# Patient Record
Sex: Female | Born: 1960 | Race: Black or African American | Hispanic: No | Smoking: Current every day smoker
Health system: Southern US, Community
[De-identification: ages and names within clinical notes are randomized; demographics above are authoritative.]

## PROBLEM LIST (undated history)

## (undated) DIAGNOSIS — Z95828 Presence of other vascular implants and grafts: Secondary | ICD-10-CM

## (undated) DIAGNOSIS — E1140 Type 2 diabetes mellitus with diabetic neuropathy, unspecified: Secondary | ICD-10-CM

## (undated) DIAGNOSIS — G709 Myoneural disorder, unspecified: Secondary | ICD-10-CM

## (undated) DIAGNOSIS — Z6823 Body mass index (BMI) 23.0-23.9, adult: Secondary | ICD-10-CM

## (undated) DIAGNOSIS — G894 Chronic pain syndrome: Secondary | ICD-10-CM

## (undated) DIAGNOSIS — I739 Peripheral vascular disease, unspecified: Secondary | ICD-10-CM

## (undated) DIAGNOSIS — I10 Essential (primary) hypertension: Secondary | ICD-10-CM

## (undated) DIAGNOSIS — E119 Type 2 diabetes mellitus without complications: Secondary | ICD-10-CM

## (undated) DIAGNOSIS — I519 Heart disease, unspecified: Secondary | ICD-10-CM

## (undated) DIAGNOSIS — C50919 Malignant neoplasm of unspecified site of unspecified female breast: Secondary | ICD-10-CM

## (undated) DIAGNOSIS — F321 Major depressive disorder, single episode, moderate: Secondary | ICD-10-CM

## (undated) DIAGNOSIS — F419 Anxiety disorder, unspecified: Secondary | ICD-10-CM

## (undated) DIAGNOSIS — D72829 Elevated white blood cell count, unspecified: Secondary | ICD-10-CM

## (undated) DIAGNOSIS — Z9989 Dependence on other enabling machines and devices: Secondary | ICD-10-CM

## (undated) HISTORY — DX: Peripheral vascular disease, unspecified: I73.9

## (undated) HISTORY — DX: Anxiety disorder, unspecified: F41.9

## (undated) HISTORY — DX: Type 2 diabetes mellitus without complications: E11.9

## (undated) HISTORY — DX: Chronic pain syndrome: G89.4

## (undated) HISTORY — DX: Heart disease, unspecified: I51.9

## (undated) HISTORY — DX: Essential (primary) hypertension: I10

## (undated) HISTORY — DX: Major depressive disorder, single episode, moderate: F32.1

## (undated) HISTORY — DX: Type 2 diabetes mellitus with diabetic neuropathy, unspecified: E11.40

## (undated) HISTORY — DX: Malignant neoplasm of unspecified site of unspecified female breast: C50.919

## (undated) HISTORY — DX: Body mass index (BMI) 23.0-23.9, adult: Z68.23

## (undated) HISTORY — DX: Elevated white blood cell count, unspecified: D72.829

## (undated) MED FILL — Carboplatin IV Soln 600 MG/60ML: INTRAVENOUS | Qty: 72 | Status: AC

---

## 1993-01-31 HISTORY — PX: MYOMECTOMY: SHX85

## 1999-01-01 ENCOUNTER — Other Ambulatory Visit: Admission: RE | Admit: 1999-01-01 | Discharge: 1999-01-01 | Payer: Self-pay | Admitting: Gynecology

## 2000-07-15 ENCOUNTER — Emergency Department (HOSPITAL_COMMUNITY): Admission: EM | Admit: 2000-07-15 | Discharge: 2000-07-16 | Payer: Self-pay | Admitting: Emergency Medicine

## 2002-01-31 HISTORY — PX: HYSTERECTOMY ABDOMINAL WITH SALPINGO-OOPHORECTOMY: SHX6792

## 2010-09-08 ENCOUNTER — Ambulatory Visit: Payer: Self-pay

## 2019-04-01 DIAGNOSIS — Z8639 Personal history of other endocrine, nutritional and metabolic disease: Secondary | ICD-10-CM

## 2019-04-01 HISTORY — DX: Personal history of other endocrine, nutritional and metabolic disease: Z86.39

## 2019-04-01 HISTORY — PX: BIOPSY THYROID: PRO38

## 2019-04-24 ENCOUNTER — Other Ambulatory Visit: Payer: Self-pay

## 2019-04-26 ENCOUNTER — Other Ambulatory Visit: Payer: Self-pay

## 2019-04-26 ENCOUNTER — Ambulatory Visit: Payer: Self-pay | Admitting: Internal Medicine

## 2019-04-26 ENCOUNTER — Encounter: Payer: Self-pay | Admitting: Internal Medicine

## 2019-04-26 VITALS — BP 110/68 | HR 105 | Temp 98.4°F | Ht 69.0 in | Wt 166.6 lb

## 2019-04-26 DIAGNOSIS — E0590 Thyrotoxicosis, unspecified without thyrotoxic crisis or storm: Secondary | ICD-10-CM

## 2019-04-26 DIAGNOSIS — E0520 Thyrotoxicosis with toxic multinodular goiter without thyrotoxic crisis or storm: Secondary | ICD-10-CM

## 2019-04-26 DIAGNOSIS — E042 Nontoxic multinodular goiter: Secondary | ICD-10-CM

## 2019-04-26 HISTORY — DX: Nontoxic multinodular goiter: E04.2

## 2019-04-26 HISTORY — DX: Thyrotoxicosis, unspecified without thyrotoxic crisis or storm: E05.90

## 2019-04-26 LAB — TSH: TSH: 0.83 u[IU]/mL (ref 0.35–4.50)

## 2019-04-26 LAB — T4, FREE: Free T4: 1.01 ng/dL (ref 0.60–1.60)

## 2019-04-26 MED ORDER — ATENOLOL 25 MG PO TABS: 25.0000 mg | ORAL_TABLET | Freq: Every day | ORAL | 4 refills | Status: DC

## 2019-04-26 NOTE — Progress Notes (Signed)
Name: Brittney Burgess  MRN/ DOB: [id number], 09-01-1960    Age/ Sex: 59 y.o., female    PCP: Leonides Sake, MD   Reason for Endocrinology Evaluation: MNG     Date of Initial Endocrinology Evaluation: 04/26/2019     HPI: [name] is a 59 y.o. female with a past medical history of T2DM, HTN and MNG. The patient presented for initial endocrinology clinic visit on 04/26/2019 for consultative assistance with her MNG.    Patient had recent hospitalization in 02/2019 for hypertensive emergency due to and treated blood pressure for approximately 4.5 years.  She was also found to have an A1c of over 14.0%, and during this hospitalization she was found to have a TSH of 0.2 uIU/mL , with an elevated free T4 at 1.97 ng/dL. Patient was symptomatic at the time with hyperthyroid symptoms such as excessive sweating, tremors, and anxiety. Pt had an ultrasound on 03/26/2019 showing multiple thyroid nodules, a right mid nodule meeting criteria for FNA measuring 2.9x1.7x2.4 cm. She had an FNA on 04/18/2019 with atypia (Bethesda Category III) .  An uptake and scan on 03/26/2019 showed a dominant cold nodule at the inferior pole of the right lobe.  With a 24-hour I-131 uptake of 23.5%   Today she continues to have jittery sensation and tremors, as well as weight loss  Denies palpitations  Denies eye symptoms  No prior exposure to radiation    No FH of thyroid disease   HISTORY:   Past Medical History: No past medical history on file.  Past Surgical History:    Social History:  reports that she has been smoking. She has never used smokeless tobacco. She reports that she does not drink alcohol or use drugs.  Family History: family history is not on file.   HOME MEDICATIONS: Allergies as of 04/26/2019      Reactions   Metformin And Related Other (See Comments)   Twitching, nausea      Medication List       Accurate as of April 26, 2019 12:51 PM. If you have any questions, ask your  nurse or doctor.        amLODipine 5 MG tablet Commonly known as: NORVASC Take by mouth.   atenolol 25 MG tablet Commonly known as: TENORMIN Take 1 tablet (25 mg total) by mouth daily. Started by: Dorita Sciara, MD   hydrochlorothiazide 25 MG tablet Commonly known as: HYDRODIURIL Take by mouth.   insulin glargine 100 UNIT/ML Solostar Pen Commonly known as: LANTUS Inject into the skin. 36-41 units   losartan 50 MG tablet Commonly known as: COZAAR Take by mouth.        OBJECTIVE:  VS: BP 110/68 (BP Location: Left Arm, Patient Position: Sitting, Cuff Size: Large)   Pulse (!) 105   Temp 98.4 F (36.9 C)   Ht 5\' 9"  (1.753 m)   Wt 166 lb 9.6 oz (75.6 kg)   SpO2 98%   BMI 24.60 kg/m    Wt Readings from Last 3 Encounters:  04/26/19 166 lb 9.6 oz (75.6 kg)     EXAM: General: Pt appears well and is in NAD  Eyes: External eye exam normal without stare, lid lag or exophthalmos.  EOM intact.   Neck: General: Supple without adenopathy. Thyroid: Thyroid is enlarged with multiple nodules appreciated. No thyroid bruit.  Lungs: Clear with good BS bilat with no rales, rhonchi, or wheezes  Heart: Auscultation: RRR.  Abdomen: Normoactive bowel sounds,  soft, nontender, without masses or organomegaly palpable  Extremities:  BL LE: No pretibial edema normal ROM and strength.  Mental Status: Judgment, insight: Intact Orientation: Oriented to time, place, and person Mood and affect: No depression, anxiety, or agitation     DATA REVIEWED: TFT's pending  Thyroid uptake and scan  03/26/2019  Showed a dominant cold nodule at the inferior pole of the right lobe.  With a 24-hour I-131 uptake of 23.5%  Thyroid ultrasound 03/26/2019           FNA 04/18/2019     Afirma - Pending   ASSESSMENT/PLAN/RECOMMENDATIONS:   1. 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 She was informed of need for regular thyroid function monitoring while on methimazole to ensure appropriate dosage without over-treatment. As well, we discussed the possible side effects of methimazole including the chance of rash, the small chance of liver irritation/juandice and the <=1 in 300-400 chance of sudden onset agranulocytosis.  We discussed importance of going to ED promptly (and stopping methimazole) if shewere to develop significant fever with severe sore throat of other evidence of acute infection.     -We will repeat TFTs - I am going to start her on a small dose of beta-blocker due to tachycardia   Medications : Start atenolol 25 mg daily    2.  Multinodular goiter:   -Patient has no local neck symptoms -She is status post FNA of the right inferior nodule, this nodule was called on the thyroid uptake and scan.  Cytology report consistent with FLUS (Bethesda category III) -Awaiting Afirma -We discussed that if her Afirma results are benign we will repeat thyroid ultrasound in a year, but if her Afirma results come back consistent with malignancy we will proceed with thyroid surgery.    Follow-up in 3 months  Signed electronically by: Mack Guise, MD  [facility]  [facility] [address] [place], [place] [address] Phone: [phone] FAX: [phone]   CC: Leonides Sake, [place] [address] Phone: [phone] Fax: [phone]   Return to Endocrinology clinic as below: Future Appointments  Date Time Provider [place]  07/26/2019  7:50 AM Alphia Behanna, Melanie Crazier, MD [facility] None

## 2019-04-26 NOTE — Patient Instructions (Addendum)
-   Please make sure you send up a copy of your Afirma results to determine the next step  - Start Atenolol 25 mg , once daily

## 2019-04-27 LAB — T3: T3, Total: 109 ng/dL (ref 76–181)

## 2019-04-29 ENCOUNTER — Telehealth: Payer: Self-pay | Admitting: Internal Medicine

## 2019-04-29 NOTE — Telephone Encounter (Signed)
Please let the pt know her thyroid function is normal. She can stop the Atenolol , she will not need any thyroid therapy.   The most common reason for her prior abnormal thyroid results is transient thyroid inflammation that appears to have resolved.     Thanks    Abby Nena Jordan, MD  [facility] End[facility]in[facility]  [facility] [address] [place], [place] [address] Phone: [phone] FAX: [phone]

## 2019-04-30 NOTE — Telephone Encounter (Signed)
Patient notified of lab results and states that she is happy to hear that.

## 2019-04-30 NOTE — Telephone Encounter (Signed)
Please refer to Dr. Quin Hoop message below and call pt as requested

## 2019-07-25 NOTE — Progress Notes (Deleted)
Name: Brittney Burgess  MRN/ DOB: [id number], [birth year]    Age/ Sex: 59 y.o., female     PCP: Leonides Sake, MD   Reason for Endocrinology Evaluation: MNG     Initial Endocrinology Clinic Visit: 04/26/2019    PATIENT IDENTIFIER: Brittney Burgess is a 59 y.o., female with a past medical history of HTN and T2DM. She has followed with [facility] since 04/26/2019 for consultative assistance with management of her MNG  HISTORICAL SUMMARY:  Pt was found to have a TSH of 0.2 uIU/mL during hospitalization in 02/2019 with an elevated free T4 at 1.97 ng/dL, a thyroid ultrasound on 03/26/2019 showed multiple thyroid nodules, a right mid nodule meeting criteria for FNA measuring 2.9 x1.7 x 2.4 cm. She had an FNA on 04/18/2019 with atypia (Bethesda Category III), Afirma Benign  .  An uptake and scan on 03/26/2019 showed a dominant cold nodule at the inferior pole of the right lobe.  With a 24-hour I-131 uptake of 23.5%   SUBJECTIVE:   Today (07/25/2019):  [name] is here for a follow up on MNG.   ROS:  As per HPI.   HISTORY:   Past Medical History: No past medical history on file.  Past Surgical History: *** The histories are not reviewed yet. Please review them in the "History" navigator section and refresh this [place].  Social History:  reports that she has been smoking. She has never used smokeless tobacco. She reports that she does not drink alcohol and does not use drugs.  Family History: No family history on file.   HOME MEDICATIONS: Allergies as of 07/26/2019      Reactions   Metformin And Related Other (See Comments)   Twitching, nausea      Medication List       Accurate as of July 25, 2019  8:38 AM. If you have any questions, ask your nurse or doctor.        amLODipine 5 MG tablet Commonly known as: NORVASC Take by mouth.   hydrochlorothiazide 25 MG tablet Commonly known as: HYDRODIURIL Take by mouth.   insulin glargine 100 UNIT/ML Solostar  Pen Commonly known as: LANTUS Inject into the skin. 36-41 units   losartan 50 MG tablet Commonly known as: COZAAR Take by mouth.         OBJECTIVE:   PHYSICAL EXAM: VS: There were no vitals taken for this visit.   EXAM: General: Pt appears well and is in NAD  Neck: General: Supple without adenopathy. Thyroid: Thyroid size normal.  No goiter or nodules appreciated. No thyroid bruit.  Lungs: Clear with good BS bilat with no rales, rhonchi, or wheezes  Heart: Auscultation: RRR.  Abdomen: Normoactive bowel sounds, soft, nontender, without masses or organomegaly palpable  Extremities:  BL LE: No pretibial edema normal ROM and strength.  Mental Status: Judgment, insight: Intact Orientation: Oriented to time, place, and person Mood and affect: No depression, anxiety, or agitation     DATA REVIEWED: ***  Thyroid uptake and scan  03/26/2019  Showed a dominant cold nodule at the inferior pole of the right lobe.  With a 24-hour I-131 uptake of 23.5%  ASSESSMENT / PLAN / RECOMMENDATIONS:   1. Multinodular Goiter:   - S/P FNA of the right mid nodule 2.9 x1.7 x 2.4 cm, on 04/18/2019 with atypia (Bethesda Category III), Afirma Benign       2. Hyperthyroidism:  Medications   ***   Signed electronically by: Elenora Gamma  Kelton Pillar, MD  [facility] Endocrinology  [facility] [address] [place], [place] [address] Phone: [phone] FAX: [phone]      CC: Leonides Sake, [place] [address] Phone: [phone]  Fax: [phone]   Return to Endocrinology clinic as below: Future Appointments  Date Time Provider [place]  07/26/2019  7:50 AM Delaynee Alred, Melanie Crazier, MD [facility] None

## 2019-07-26 ENCOUNTER — Ambulatory Visit: Payer: Self-pay | Admitting: Internal Medicine

## 2019-07-26 DIAGNOSIS — Z0289 Encounter for other administrative examinations: Secondary | ICD-10-CM

## 2019-10-25 ENCOUNTER — Other Ambulatory Visit: Payer: Self-pay | Admitting: Surgery

## 2019-10-25 DIAGNOSIS — Z171 Estrogen receptor negative status [ER-]: Secondary | ICD-10-CM

## 2019-10-25 DIAGNOSIS — C50512 Malignant neoplasm of lower-outer quadrant of left female breast: Secondary | ICD-10-CM

## 2019-11-11 ENCOUNTER — Other Ambulatory Visit: Payer: Self-pay | Admitting: Hematology and Oncology

## 2019-11-11 DIAGNOSIS — Z171 Estrogen receptor negative status [ER-]: Secondary | ICD-10-CM

## 2019-11-11 DIAGNOSIS — C50512 Malignant neoplasm of lower-outer quadrant of left female breast: Secondary | ICD-10-CM

## 2019-11-11 LAB — HEPATIC FUNCTION PANEL
ALT: 12 (ref 7–35)
AST: 24 (ref 13–35)
Alkaline Phosphatase: 135 — AB (ref 25–125)
Bilirubin, Total: 0.4

## 2019-11-11 LAB — CBC: RBC: 4.43 (ref 3.87–5.11)

## 2019-11-11 LAB — CBC AND DIFFERENTIAL
HCT: 37 (ref 36–46)
Hemoglobin: 12.5 (ref 12.0–16.0)
Neutrophils Absolute: 9230
Platelets: 291 (ref 150–399)
WBC: 12.3

## 2019-11-11 LAB — BASIC METABOLIC PANEL
BUN: 15 (ref 4–21)
CO2: 25 — AB (ref 13–22)
Chloride: 107 (ref 99–108)
Creatinine: 0.8 (ref 0.5–1.1)
Glucose: 148
Potassium: 3.4 (ref 3.4–5.3)
Sodium: 142 (ref 137–147)

## 2019-11-11 LAB — COMPREHENSIVE METABOLIC PANEL
Albumin: 4.3 (ref 3.5–5.0)
Calcium: 10 (ref 8.7–10.7)

## 2019-11-13 ENCOUNTER — Ambulatory Visit
Admission: RE | Admit: 2019-11-13 | Discharge: 2019-11-13 | Disposition: A | Discharge status: Home / Self Care | Payer: PRIVATE HEALTH INSURANCE | Source: Ambulatory Visit | Attending: Surgery | Admitting: Surgery

## 2019-11-13 DIAGNOSIS — Z171 Estrogen receptor negative status [ER-]: Secondary | ICD-10-CM

## 2019-11-13 DIAGNOSIS — C50512 Malignant neoplasm of lower-outer quadrant of left female breast: Secondary | ICD-10-CM

## 2019-11-13 MED ORDER — GADOBUTROL 1 MMOL/ML IV SOLN
7.0000 mL | Freq: Once | INTRAVENOUS | Status: AC | PRN
  Administered 2019-11-13: 7 mL via INTRAVENOUS

## 2019-12-08 DIAGNOSIS — C50919 Malignant neoplasm of unspecified site of unspecified female breast: Secondary | ICD-10-CM | POA: Insufficient documentation

## 2019-12-08 DIAGNOSIS — C50512 Malignant neoplasm of lower-outer quadrant of left female breast: Secondary | ICD-10-CM | POA: Insufficient documentation

## 2019-12-08 HISTORY — DX: Malignant neoplasm of lower-outer quadrant of left female breast: C50.512

## 2019-12-25 HISTORY — PX: BREAST LUMPECTOMY: SHX2

## 2020-01-01 DIAGNOSIS — E876 Hypokalemia: Secondary | ICD-10-CM

## 2020-01-01 HISTORY — DX: Hypokalemia: E87.6

## 2020-01-07 ENCOUNTER — Other Ambulatory Visit: Payer: Self-pay | Admitting: Oncology

## 2020-01-07 DIAGNOSIS — Z171 Estrogen receptor negative status [ER-]: Secondary | ICD-10-CM

## 2020-01-08 ENCOUNTER — Other Ambulatory Visit: Payer: PRIVATE HEALTH INSURANCE

## 2020-01-08 ENCOUNTER — Ambulatory Visit: Payer: PRIVATE HEALTH INSURANCE | Admitting: Oncology

## 2020-01-08 NOTE — Progress Notes (Incomplete)
[facility]  [address] [place],  [place]  [address] [phone]  Clinic Day:  01/08/2020  Referring physician: Carolynne Edouard, MD   This document serves as a record of services personally performed by Hosie Poisson, MD. It was created on their behalf by Curry,Lauren E, a trained medical scribe. The creation of this record is based on the scribe's personal observations and the provider's statements to them.   CHIEF COMPLAINT:  CC: Stage I ductal carcinoma of the left breast  Current Treatment:  ***   HISTORY OF PRESENT ILLNESS:  Brittney Burgess is a 59 y.o. 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  This was found on screening mammogram from August which revealed a mass with calcifications, a second asymmetry and possible dilated ducts within the left breast.  Right breast was negative.  Diagnostic unilateral left mammogram and left breast ultrasound confirmed a 1.6 cm elongated mass containing pleomorphic calcifications at 7 o'clock, a 5 mm hypoechoic indeterminate mass containing 2 benign-appearing calcification at 2 o'clock, and several retroareolar ecstatic ducts containing internal debris or possible tissue.  Ultrasound guided biopsy was then pursued on September 20th which revealed invasive ductal carcinoma, grade 3, and high grade ductal carcinoma in situ with necrosis at 5 o'clock.  The mass at 2 o'clock was consistent with fibrocystic changes including sclerosing adenosis with calcifications.  No atypia, in situ or invasive malignancy.   Biopsy at 11:30 o'clock revealed a fragmented intraductal papilloma and dilated duct.  No atypia, in situ or invasive malignancy identified.  She was referred and has been seen by Dr. Lilia Pro, and they do plan to pursue a lumpectomy.  She is scheduled for a breast MRI on Wednesday.    She states that she is healing well from her procedure, and is doing well.  She  does report neuropathy of her feet from her diabetes.  Her white count is 12.3, and her hemoglobin and platelets are normal.  Chemistries are unremarkable except for a mildly low potassium of 3.4.  I gave her a list of potassium rich foods to include in her diet.  Her appetite is good, and she is eating well.  She denies any significant weight loss or gain. She denies fever or chills.  She denies nausea, vomiting, bowel issues, or abdominal pain.  She denies sore throat, cough, dyspnea, or chest pain.  She started menarche at age 36.  She is nulliparous.  She did take oral contraceptive for a time, but this made her gain a significant amount of weight.  She had an emergency hysterectomy and bilateral salpingo oophorectomy in 2004 due to menorrhagia.  She is unsure if she was given hormone replacement.  Her friend Jeffry accompanies her today.    INTERVAL HISTORY:  Brittney Burgess is here for follow up after undergoing left breast lumpectomy and left axillary sentinel lymph node biopsy on November 11th with Dr. Lilia Pro.  Surgical pathology from this procedure confirmed invasive and in situ ductal carcinoma measuring 1.2 cm.  Margins not involved.  One sentinel lymph node was negative for malignancy.   Her  appetite is good, and she has gained/lost _ pounds since her last visit.  She denies fever, chills or other signs of infection.  She denies nausea, vomiting, bowel issues, or abdominal pain.  She denies sore throat, cough, dyspnea, or chest pain.   REVIEW OF SYSTEMS:  Review of Systems - Oncology   VITALS:  There were no vitals taken for this visit.  Wt Readings from Last 3 Encounters:  04/26/19 166 lb 9.6 oz (75.6 kg)    There is no height or weight on file to calculate BMI.  Performance status (ECOG): {CHL ONC [id number]  PHYSICAL EXAM:  Physical Exam  LABS:   CBC Latest Ref Rng & Units 11/11/2019  WBC - 12.3  Hemoglobin 12.0 - 16.0 12.5  Hematocrit 36 - 46 37  Platelets 150 - 399 291    CMP Latest Ref Rng & Units 11/11/2019  BUN 4 - 21 15  Creatinine 0.5 - 1.1 0.8  Sodium 137 - 147 142  Potassium 3.4 - 5.3 3.4  Chloride 99 - 108 107  CO2 13 - 22 25(A)  Calcium 8.7 - 10.7 10.0  Alkaline Phos 25 - 125 135(A)  AST 13 - 35 24  ALT 7 - 35 12     No results found for: CEA1 / No results found for: CEA1 No results found for: PSA1 No results found for: SWH675 No results found for: FFM384  No results found for: TOTALPROTELP, ALBUMINELP, A1GS, A2GS, BETS, BETA2SER, GAMS, MSPIKE, SPEI No results found for: TIBC, FERRITIN, IRONPCTSAT No results found for: LDH   STUDIES:   She underwent left breast lumpectomy and left axillary sentinel lymph node biopsy on 12/23/2019 and surgical pathology from this procedure revealed: 1.  Breast, lumpectomy, left:  -  Invasive and in situ ductal carcinoma, 1.2 cm  -  Margins not involved  -  Invasive carcinoma 0.2 cm from superior margin  -  Ductal carcinoma in situ 0.1 cm from inferior margin  -  Biopsy site and biopsy clip 2.  Lymph node, sentinel, biopsy, left:  -  One benign, reactive lymph node with focal biopsy site reaction (0/1) Tumor size: 1.2 x 1.0 x 0.8 cm Histologic grade: 3   Allergies:  Allergies  Allergen Reactions  . Metformin And Related Other (See Comments)    Twitching, nausea    Current Medications: Current Outpatient Medications  Medication Sig Dispense Refill  . amLODipine (NORVASC) 5 MG tablet Take by mouth.    . hydrochlorothiazide (HYDRODIURIL) 25 MG tablet Take by mouth.    . insulin glargine (LANTUS) 100 UNIT/ML Solostar Pen Inject into the skin. 36-41 units    . losartan (COZAAR) 50 MG tablet Take by mouth.     No current facility-administered medications for this visit.     ASSESSMENT & PLAN:   Assessment:   1.  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  HER2 positive.  Estrogen and progesterone receptors were negative.  Ki67 was 50%.  She has met with  Dr. Lilia Pro who will be pursuing breast conservative surgery with lumpectomy if the MRI is otherwise clear.  I do not feel neoadjuvant chemotherapy is necessary since this tumor is less than 2 cm.  2.  Hypokalemia, mild.  I gave her a list of potassium rich foods to include through diet.  3.  Neuropathy of bilateral feet.  This is currently well controlled.  4.  Tobacco abuse.  We discussed the importance of smoking cessation.  She has a prescription for Nicorette gum.  Plan: She underwent left breast lumpectomy and left axillary sentinel lymph node biopsy on November 11th with Dr. Lilia Pro.  Surgical pathology from this procedure confirmed invasive and in situ ductal carcinoma measuring 1.2 cm.  Margins not involved.  One sentinel lymph node was negative for malignancy.  We  reviewed her prognostic features and discussed adjuvant therapy.  As she is HER2 positive, I recommend treatment with TCHP IV every 3 weeks for 6 cycles, then continue maintenance Herceptin and Perjeta for a total of 1 year.  I explained the schedule and possible toxicities associated with treatment, but we would schedule her for chemo-education prior to her start date.  She would benefit from Neulasta to help prevent associated leukopenia/neutropenia as well.  She will also need to undergo a port placement which can be scheduled with Dr. Lilia Pro.  She will also need a baseline ECHO prior to initiation.  Adjuvant radiation is also advised as she is pursuing breast conservative surgery.  She understands and agrees to this plan of care.  I have answered her questions and she knows to call with any concerns.   I provided *** minutes (8:44 AM - 8:44 AM) of face-to-face time during this this encounter and > 50% was spent counseling as documented under my assessment and plan.    Derwood Kaplan, MD [facility] [address] [place] [address] Dept:  [phone] Dept Fax: [phone]   I, Rita Ohara, am acting as scribe for Derwood Kaplan, MD  I have reviewed this report as typed by the medical scribe, and it is complete and accurate.

## 2020-01-09 DIAGNOSIS — I10 Essential (primary) hypertension: Secondary | ICD-10-CM | POA: Insufficient documentation

## 2020-01-09 DIAGNOSIS — E1140 Type 2 diabetes mellitus with diabetic neuropathy, unspecified: Secondary | ICD-10-CM

## 2020-01-09 HISTORY — DX: Type 2 diabetes mellitus with diabetic neuropathy, unspecified: E11.40

## 2020-01-10 NOTE — Progress Notes (Signed)
[name]  [address] [place],  [place]  [address] [phone]  Clinic Day:  01/13/2020  Referring physician: Carolynne Edouard, MD   This document serves as a record of services personally performed by Hosie Poisson, MD. It was created on their behalf by Curry,Lauren E, a trained medical scribe. The creation of this record is based on the scribe's personal observations and the provider's statements to them.   CHIEF COMPLAINT:  CC: Stage I invasive ductal carcinoma of the left breast  Current Treatment:  Will plan for TCHP IV every 3 weeks for 6 doses, followed by maintenance Herceptin/Perjeta for a total of 1 year of therapy.   HISTORY OF PRESENT ILLNESS:  Brittney Burgess is a 59 y.o. 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  This was found on screening mammogram from August which revealed a mass with calcifications, a second asymmetry and possible dilated ducts within the left breast.  Right breast was negative.  Diagnostic unilateral left mammogram and left breast ultrasound confirmed a 1.6 cm elongated mass containing pleomorphic calcifications at 7 o'clock, a 5 mm hypoechoic indeterminate mass containing 2 benign-appearing calcification at 2 o'clock, and several retroareolar ecstatic ducts containing internal debris or possible tissue.  Ultrasound guided biopsy was then pursued on September 20th which revealed invasive ductal carcinoma, grade 3, and high grade ductal carcinoma in situ with necrosis at 5 o'clock.  The mass at 2 o'clock was consistent with fibrocystic changes including sclerosing adenosis with calcifications.  No atypia, in situ or invasive malignancy.   Biopsy at 11:30 o'clock revealed a fragmented intraductal papilloma and dilated duct.  No atypia, in situ or invasive malignancy identified.  She does report neuropathy of her feet from her diabetes.  She had lumpectomy with sentinel  lymph node on November 22nd.  She also had placement of a port at that time.  Her potassium was mildly low on her initial labs and so we will follow up on that.  She had an emergency hysterectomy and bilateral salpingo oophorectomy in 2004 due to menorrhagia.  She is unsure if she was given hormone replacement.   INTERVAL HISTORY:  Brittney Burgess is here for follow up after undergoing left breast lumpectomy and left axillary sentinel lymph node biopsy on November 22nd with Dr. Lilia Pro.  Surgical pathology from this procedure confirmed invasive and in situ ductal carcinoma measuring 1.2 cm.  Margins not involved.  One sentinel lymph node was negative for malignancy for a T1cN0M0, stage IB.  Estrogen and progesterone receptors were negative with HER2 positive and a Ki67 of 50%.  She has already had a port placed.  She still has residual tenderness of the left axilla, but is otherwise healing well.  She is scheduled to follow up with Dr. Lilia Pro in mid January.  Her  appetite is good, and she has gained 5 pounds since her last visit.  She denies fever, chills or other signs of infection.  She denies nausea, vomiting, bowel issues, or abdominal pain.  She denies sore throat, cough, dyspnea, or chest pain.    REVIEW OF SYSTEMS:  Review of Systems  Constitutional: Negative.   HENT:  Negative.   Eyes: Negative.   Respiratory: Negative.   Cardiovascular: Negative.   Gastrointestinal: Negative.   Endocrine: Negative.   Genitourinary: Negative.    Musculoskeletal: Negative.        Mild residual tenderness of the left axilla  Skin: Negative.  Neurological: Positive for headaches (this morning, mild).  Hematological: Negative.   Psychiatric/Behavioral: Negative.      VITALS:  Blood pressure (!) 177/93, pulse (!) 111, temperature 98.2 F (36.8 C), resp. rate 16, height '5\' 9"'  (1.753 m), weight 172 lb (78 kg), SpO2 95 %.  Wt Readings from Last 3 Encounters:  01/13/20 172 lb (78 kg)  04/26/19 166 lb 9.6 oz (75.6  kg)    Body mass index is 25.4 kg/m.  Performance status (ECOG): 1 - Symptomatic but completely ambulatory  PHYSICAL EXAM:  Physical Exam Constitutional:      General: She is not in acute distress.    Appearance: Normal appearance. She is normal weight.  HENT:     Head: Normocephalic and atraumatic.  Eyes:     General: No scleral icterus.    Extraocular Movements: Extraocular movements intact.     Conjunctiva/sclera: Conjunctivae normal.     Pupils: Pupils are equal, round, and reactive to light.  Cardiovascular:     Rate and Rhythm: Regular rhythm. Tachycardia present.     Pulses: Normal pulses.     Heart sounds: Normal heart sounds. No murmur heard. No friction rub. No gallop.   Pulmonary:     Effort: Pulmonary effort is normal. No respiratory distress.     Breath sounds: Normal breath sounds.  Chest:  Breasts:     Right: Normal.     Left: Normal.      Comments: Axillary incision is healing well, but mildly tender.  She has an incision in the inferior left breast which is healing well.  Port site in right upper chest is healing well with just one area of scab  She has a healing boil of the left inner breast and several other keloids of both breasts.  No masses in either breast. Abdominal:     General: Bowel sounds are normal. There is no distension.     Palpations: Abdomen is soft. There is no mass.     Tenderness: There is no abdominal tenderness.  Musculoskeletal:        General: Normal range of motion.     Cervical back: Normal range of motion and neck supple.     Right lower leg: No edema.     Left lower leg: No edema.  Lymphadenopathy:     Cervical: No cervical adenopathy.  Skin:    General: Skin is warm and dry.  Neurological:     General: No focal deficit present.     Mental Status: She is alert and oriented to person, place, and time. Mental status is at baseline.  Psychiatric:        Mood and Affect: Mood normal.        Behavior: Behavior normal.         Thought Content: Thought content normal.        Judgment: Judgment normal.     LABS:   CBC Latest Ref Rng & Units 11/11/2019  WBC - 12.3  Hemoglobin 12.0 - 16.0 12.5  Hematocrit 36 - 46 37  Platelets 150 - 399 291   CMP Latest Ref Rng & Units 11/11/2019  BUN 4 - 21 15  Creatinine 0.5 - 1.1 0.8  Sodium 137 - 147 142  Potassium 3.4 - 5.3 3.4  Chloride 99 - 108 107  CO2 13 - 22 25(A)  Calcium 8.7 - 10.7 10.0  Alkaline Phos 25 - 125 135(A)  AST 13 - 35 24  ALT 7 - 35 12  STUDIES:   She underwent left breast lumpectomy and left axillary sentinel lymph node biopsy on 12/23/2019 and surgical pathology from this procedure revealed: 1.  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  Lymph node, sentinel, biopsy, left:             -  One benign, reactive lymph node with focal biopsy site reaction (0/1) Tumor size: 1.2 x 1.0 x 0.8 cm Histologic grade: 3  Allergies:  Allergies  Allergen Reactions  . Metformin And Related Other (See Comments)    Twitching, nausea    Current Medications: Current Outpatient Medications  Medication Sig Dispense Refill  . amLODipine (NORVASC) 5 MG tablet Take by mouth.    . hydrochlorothiazide (HYDRODIURIL) 25 MG tablet Take by mouth.    . insulin glargine (LANTUS) 100 UNIT/ML Solostar Pen Inject into the skin. 36-41 units    . losartan (COZAAR) 50 MG tablet Take by mouth.     No current facility-administered medications for this visit.     ASSESSMENT & PLAN:   Assessment:   1.  Stage IB (T1cN0M0) invasive ductal carcinoma and high grade ductal carcinoma in situ.  HER2 positive.  Estrogen and progesterone receptors were negative.  Ki67 was 50%.  She has undergone lumpectomy with Dr.  Lilia Pro.  We will now plan for TCHP IV every 3 weeks for 6 cycles, to then continue with maintenance Herceptin/Perjeta for a total of 1 year.  2.  Hypokalemia, mild.  I gave her a list of potassium rich foods to include through diet.  3.  Neuropathy of bilateral feet.  This is currently well controlled.  4.  Tobacco abuse.  We discussed the importance of smoking cessation.  She has a prescription for Nicorette gum.  Plan: She underwent left breast lumpectomy and left axillary sentinel lymph node biopsy on November 22nd with Dr. Lilia Pro.  Surgical pathology from this procedure confirmed invasive and in situ ductal carcinoma measuring 1.2 cm.  Margins not involved.  One sentinel lymph node was negative for malignancy for a stage IB.  We reviewed her prognostic features and discussed adjuvant therapy.  As she is HER2 positive, I recommend treatment with TCHP IV every 3 weeks for 6 cycles, then continue maintenance Herceptin and Perjeta for a total of 1 year.  I explained the schedule and possible toxicities associated with treatment, but we will schedule her for chemo-education prior to her start date.  We will need a baseline ECHO prior to initiation.  She would also benefit from Neulasta to help prevent associated leukopenia/neutropenia as well.  We will plan to start treatment at the end of the month.  Adjuvant radiation is also advised as she opted for breast conservative surgery, but we will postpone that until she has completed the chemotherapy portion of her treatment.  She understands and agrees to this plan of care.  I have answered her questions and she knows to call  with any concerns.   I provided 25 minutes of face-to-face time during this this encounter and > 50% was spent counseling as documented under my assessment and plan.    Derwood Kaplan, MD [facility] AT [facility] [address] [place] [address] Dept:  [phone] Dept Fax: [phone]   I, Rita Ohara, am acting as scribe for Derwood Kaplan, MD  I have reviewed this report as typed by the medical scribe, and it is complete and accurate.

## 2020-01-13 ENCOUNTER — Encounter: Payer: Self-pay | Admitting: Oncology

## 2020-01-13 ENCOUNTER — Inpatient Hospital Stay: Payer: Medicaid Other | Attending: Oncology | Admitting: Oncology

## 2020-01-13 ENCOUNTER — Other Ambulatory Visit: Payer: Self-pay | Admitting: Oncology

## 2020-01-13 ENCOUNTER — Other Ambulatory Visit: Payer: Self-pay

## 2020-01-13 ENCOUNTER — Telehealth: Payer: Self-pay | Admitting: Oncology

## 2020-01-13 VITALS — BP 177/93 | HR 111 | Temp 98.2°F | Resp 16 | Ht 69.0 in | Wt 172.0 lb

## 2020-01-13 DIAGNOSIS — C50512 Malignant neoplasm of lower-outer quadrant of left female breast: Secondary | ICD-10-CM

## 2020-01-13 DIAGNOSIS — Z171 Estrogen receptor negative status [ER-]: Secondary | ICD-10-CM | POA: Insufficient documentation

## 2020-01-13 DIAGNOSIS — Z794 Long term (current) use of insulin: Secondary | ICD-10-CM

## 2020-01-13 DIAGNOSIS — E876 Hypokalemia: Secondary | ICD-10-CM | POA: Insufficient documentation

## 2020-01-13 DIAGNOSIS — D0512 Intraductal carcinoma in situ of left breast: Secondary | ICD-10-CM | POA: Insufficient documentation

## 2020-01-13 DIAGNOSIS — I10 Essential (primary) hypertension: Secondary | ICD-10-CM

## 2020-01-13 DIAGNOSIS — F17210 Nicotine dependence, cigarettes, uncomplicated: Secondary | ICD-10-CM | POA: Insufficient documentation

## 2020-01-13 DIAGNOSIS — Z79899 Other long term (current) drug therapy: Secondary | ICD-10-CM | POA: Insufficient documentation

## 2020-01-13 DIAGNOSIS — G629 Polyneuropathy, unspecified: Secondary | ICD-10-CM | POA: Insufficient documentation

## 2020-01-13 DIAGNOSIS — Z5111 Encounter for antineoplastic chemotherapy: Secondary | ICD-10-CM | POA: Insufficient documentation

## 2020-01-13 DIAGNOSIS — E1140 Type 2 diabetes mellitus with diabetic neuropathy, unspecified: Secondary | ICD-10-CM

## 2020-01-13 NOTE — Telephone Encounter (Signed)
Per 12/13 los next appt given to patient 

## 2020-01-14 ENCOUNTER — Other Ambulatory Visit: Payer: Self-pay | Admitting: Pharmacist

## 2020-01-14 DIAGNOSIS — C50512 Malignant neoplasm of lower-outer quadrant of left female breast: Secondary | ICD-10-CM

## 2020-01-15 DIAGNOSIS — T451X1A Poisoning by antineoplastic and immunosuppressive drugs, accidental (unintentional), initial encounter: Secondary | ICD-10-CM | POA: Diagnosis not present

## 2020-01-15 DIAGNOSIS — C50919 Malignant neoplasm of unspecified site of unspecified female breast: Secondary | ICD-10-CM

## 2020-01-16 ENCOUNTER — Telehealth: Payer: Self-pay

## 2020-01-16 NOTE — Telephone Encounter (Signed)
-----   Message from Derwood Kaplan, MD sent at 01/15/2020  6:12 PM EST ----- Regarding: call pt Tell her heart test looks good

## 2020-01-17 ENCOUNTER — Telehealth: Payer: Self-pay

## 2020-01-17 NOTE — Telephone Encounter (Signed)
Pt notified of Dr Hinton Rao response.

## 2020-01-21 ENCOUNTER — Encounter: Payer: Self-pay | Admitting: Oncology

## 2020-01-21 ENCOUNTER — Other Ambulatory Visit: Payer: Self-pay | Admitting: Oncology

## 2020-01-22 ENCOUNTER — Encounter: Payer: Self-pay | Admitting: Oncology

## 2020-01-27 ENCOUNTER — Other Ambulatory Visit: Payer: Self-pay | Admitting: Oncology

## 2020-01-27 NOTE — Progress Notes (Deleted)
[facility]  [address] [place],  [place]  [address] [phone]  Clinic Day:  01/27/2020  Referring physician: Carolynne Edouard, MD   This document serves as a record of services personally performed by Hosie Poisson, MD. It was created on their behalf by Curry,Lauren E, a trained medical scribe. The creation of this record is based on the scribe's personal observations and the provider's statements to them.   CHIEF COMPLAINT:  CC: Stage I invasive ductal carcinoma of the left breast  Current Treatment:  Will plan for TCHP IV every 3 weeks for 6 doses, followed by maintenance Herceptin/Perjeta for a total of 1 year of therapy.   HISTORY OF PRESENT ILLNESS:  Brittney Burgess is a 59 y.o. 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  This was found on screening mammogram from August which revealed a mass with calcifications, a second asymmetry and possible dilated ducts within the left breast.  Right breast was negative.  Diagnostic unilateral left mammogram and left breast ultrasound confirmed a 1.6 cm elongated mass containing pleomorphic calcifications at 7 o'clock, a 5 mm hypoechoic indeterminate mass containing 2 benign-appearing calcification at 2 o'clock, and several retroareolar ecstatic ducts containing internal debris or possible tissue.  Ultrasound guided biopsy was then pursued on September 20th which revealed invasive ductal carcinoma, grade 3, and high grade ductal carcinoma in situ with necrosis at 5 o'clock.  The mass at 2 o'clock was consistent with fibrocystic changes including sclerosing adenosis with calcifications.  No atypia, in situ or invasive malignancy.   Biopsy at 11:30 o'clock revealed a fragmented intraductal papilloma and dilated duct.  No atypia, in situ or invasive malignancy identified.  She does report neuropathy of her feet from her diabetes.  She had lumpectomy with sentinel  lymph node on November 22nd.  She also had placement of a port at that time.  Her potassium was mildly low on her initial labs and so we will follow up on that.  She had an emergency hysterectomy and bilateral salpingo oophorectomy in 2004 due to menorrhagia.  She is unsure if she was given hormone replacement.   INTERVAL HISTORY:  Brittney Burgess is here for follow up after undergoing left breast lumpectomy and left axillary sentinel lymph node biopsy on November 22nd with Dr. Lilia Pro.  Surgical pathology from this procedure confirmed invasive and in situ ductal carcinoma measuring 1.2 cm.  Margins not involved.  One sentinel lymph node was negative for malignancy for a T1cN0M0, stage IB.  Estrogen and progesterone receptors were negative with HER2 positive and a Ki67 of 50%.  She has already had a port placed.  She still has residual tenderness of the left axilla, but is otherwise healing well.  She is scheduled to follow up with Dr. Lilia Pro in mid January.  Her  appetite is good, and she has gained 5 pounds since her last visit.  She denies fever, chills or other signs of infection.  She denies nausea, vomiting, bowel issues, or abdominal pain.  She denies sore throat, cough, dyspnea, or chest pain.    REVIEW OF SYSTEMS:  Review of Systems - Oncology   VITALS:  There were no vitals taken for this visit.  Wt Readings from Last 3 Encounters:  01/13/20 172 lb (78 kg)  04/26/19 166 lb 9.6 oz (75.6 kg)    There is no height or weight on file to calculate BMI.  Performance status (ECOG): 1 -  Symptomatic but completely ambulatory  PHYSICAL EXAM:  Physical Exam  LABS:   CBC Latest Ref Rng & Units 11/11/2019  WBC - 12.3  Hemoglobin 12.0 - 16.0 12.5  Hematocrit 36 - 46 37  Platelets 150 - 399 291   CMP Latest Ref Rng & Units 11/11/2019  BUN 4 - 21 15  Creatinine 0.5 - 1.1 0.8  Sodium 137 - 147 142  Potassium 3.4 - 5.3 3.4  Chloride 99 - 108 107  CO2 13 - 22 25(A)  Calcium 8.7 - 10.7 10.0   Alkaline Phos 25 - 125 135(A)  AST 13 - 35 24  ALT 7 - 35 12    STUDIES:   She underwent left breast lumpectomy and left axillary sentinel lymph node biopsy on 12/23/2019 and surgical pathology from this procedure revealed: 1.  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  Lymph node, sentinel, biopsy, left:             -  One benign, reactive lymph node with focal biopsy site reaction (0/1) Tumor size: 1.2 x 1.0 x 0.8 cm Histologic grade: 3  Allergies:  Allergies  Allergen Reactions  . Metformin And Related Other (See Comments)    Twitching, nausea    Current Medications: Current Outpatient Medications  Medication Sig Dispense Refill  . amLODipine (NORVASC) 5 MG tablet Take by mouth.    . hydrochlorothiazide (HYDRODIURIL) 25 MG tablet Take by mouth.    . insulin glargine (LANTUS) 100 UNIT/ML Solostar Pen Inject into the skin. 36-41 units    . losartan (COZAAR) 50 MG tablet Take by mouth.     No current facility-administered medications for this visit.     ASSESSMENT & PLAN:   Assessment:   1.  Stage IB (T1cN0M0) invasive ductal carcinoma and high grade ductal carcinoma in situ.  HER2 positive.  Estrogen and progesterone receptors were negative.  Ki67 was 50%.  She has undergone lumpectomy with Dr. Lilia Pro.  We will now plan for TCHP IV every 3 weeks for 6 cycles, to then continue with maintenance Herceptin/Perjeta for a total of 1 year.  2.  Hypokalemia, mild.  I gave her a list of potassium rich foods to include through diet.  3.  Neuropathy of bilateral feet.  This is currently well controlled.  4.  Tobacco abuse.  We discussed the importance of smoking cessation.  She has a prescription for Nicorette  gum.  Plan: She underwent left breast lumpectomy and left axillary sentinel lymph node biopsy on November 22nd with Dr. Lilia Pro.  Surgical pathology from this procedure confirmed invasive and in situ ductal carcinoma measuring 1.2 cm.  Margins not involved.  One sentinel lymph node was negative for malignancy for a stage IB.  We reviewed her prognostic features and discussed adjuvant therapy.  As she is HER2 positive, I recommend treatment with TCHP IV every 3 weeks for 6 cycles, then continue maintenance Herceptin  and Perjeta for a total of 1 year.  I explained the schedule and possible toxicities associated with treatment, but we will schedule her for chemo-education prior to her start date.  We will need a baseline ECHO prior to initiation.  She would also benefit from Neulasta to help prevent associated leukopenia/neutropenia as well.  We will plan to start treatment at the end of the month.  Adjuvant radiation is also advised as she opted for breast conservative surgery, but we will postpone that until she has completed the chemotherapy portion of her treatment.  She understands and agrees to this plan of care.  I have answered her questions and she knows to call with any concerns.   I provided 25 minutes of face-to-face time during this this encounter and > 50% was spent counseling as documented under my assessment and plan.    [name], NP [facility] [address] [place] [address] Dept: [phone] Dept Fax: [phone]   I, Rita Ohara, am acting as scribe for Derwood Kaplan, MD  I have reviewed this report as typed by the medical scribe, and it is complete and accurate.

## 2020-01-28 ENCOUNTER — Inpatient Hospital Stay (INDEPENDENT_AMBULATORY_CARE_PROVIDER_SITE_OTHER): Payer: Medicaid Other | Admitting: Hematology and Oncology

## 2020-01-28 ENCOUNTER — Inpatient Hospital Stay: Payer: Medicaid Other

## 2020-01-28 ENCOUNTER — Other Ambulatory Visit: Payer: Self-pay | Admitting: Oncology

## 2020-01-28 ENCOUNTER — Other Ambulatory Visit: Payer: Self-pay | Admitting: Hematology and Oncology

## 2020-01-28 ENCOUNTER — Other Ambulatory Visit: Payer: Self-pay

## 2020-01-28 VITALS — BP 191/99 | HR 94 | Temp 97.9°F | Resp 16 | Ht 69.0 in | Wt 165.9 lb

## 2020-01-28 DIAGNOSIS — C50512 Malignant neoplasm of lower-outer quadrant of left female breast: Secondary | ICD-10-CM

## 2020-01-28 DIAGNOSIS — Z171 Estrogen receptor negative status [ER-]: Secondary | ICD-10-CM

## 2020-01-28 LAB — CREATININE, SERUM: Creatinine: 0.8 (ref 0.5–1.1)

## 2020-01-28 LAB — CBC AND DIFFERENTIAL
HCT: 39 (ref 36–46)
Hemoglobin: 13 (ref 12.0–16.0)
Neutrophils Absolute: 7.01
Platelets: 250 (ref 150–399)
WBC: 9.6

## 2020-01-28 LAB — BASIC METABOLIC PANEL
BUN: 14 (ref 4–21)
CO2: 25 — AB (ref 13–22)
Chloride: 107 (ref 99–108)
Creatinine: 0.9 (ref 0.5–1.1)
Potassium: 3.8 (ref 3.4–5.3)
Sodium: 140 (ref 137–147)

## 2020-01-28 LAB — CBC: RBC: 4.64 (ref 3.87–5.11)

## 2020-01-28 MED ORDER — DEXAMETHASONE 4 MG PO TABS: ORAL_TABLET | ORAL | 0 refills | Status: DC

## 2020-01-28 MED ORDER — ONDANSETRON HCL 4 MG PO TABS: 4.0000 mg | ORAL_TABLET | ORAL | 3 refills | Status: DC | PRN

## 2020-01-28 MED ORDER — PROCHLORPERAZINE MALEATE 10 MG PO TABS: 10.0000 mg | ORAL_TABLET | Freq: Four times a day (QID) | ORAL | 3 refills | Status: DC | PRN

## 2020-01-28 NOTE — Progress Notes (Signed)
The patient is a 59 year old female with newly diagnosed  Stage IB (T1cN0M0) invasive ductal carcinoma and high grade ductal carcinoma in situ. HER2 positive. Estrogen and progesterone receptors were negative. Ki67 was 50%. Patient presents to clinic today for chemotherapy education and palliative care consult.  We will start docetaxel, carboplatin, trastuzumab, and pertuzumab 01-29-2020. We will send in prescriptions for dexamethasone prochlorperazine and ondansetron.  The patient verbalizes understanding of and agreement to the plan as discussed today.  Provided general information including the following: 1.  Date of education: 01-28-2020 2.  Physician name: Dr. Hinton Rao 3.  Diagnosis: Invasive ductal carcinoma and high grade ductal carcinoma in situ 4.  Stage: Stage IB 5.  Curative  6.  Chemotherapy plan including drugs and how often: Docetaxel, Carboplatin, Trastuzumab, Pertuzumab 7.  Start date:01-29-2020 8.  Other referrals: No other referrals at this time 9.  The patient is to call our office with any questions or concerns.  Our office number [phone], if after hours or on the weekend, call the same number and wait for the answering service.  There is always an oncologist on call 10.  Medications prescribed: Zofran, Compazine and Dexamethasone 11.  The patient has verbalized understanding of the treatment plan and has no barriers to adherence or understanding.  Obtained signed consent from patient.  Discussed symptoms including 1.  Low blood counts including red blood cells, white blood cells and platelets. 2. Infection including to avoid large crowds, wash hands frequently, and stay away from people who were sick.  If fever develops of 100.4 or higher, call our office. 3.  Mucositis-given instructions on mouth rinse (baking soda and salt mixture).  Keep mouth clean.  Use soft bristle toothbrush.  If mouth sores develop, call our clinic. 4.  Nausea/vomiting-gave prescriptions for  ondansetron 4 mg every 4 hours as needed for nausea, may take around the clock if persistent.  Compazine 10 mg every 6 hours, may take around the clock if persistent. 5.  Diarrhea-use over-the-counter Imodium.  Call clinic if not controlled. 6.  Constipation-use senna, 1 to 2 tablets twice a day.  If no BM in 2 to 3 days call the clinic. 7.  Loss of appetite-try to eat small meals every 2-3 hours.  Call clinic if not eating. 8.  Taste changes-zinc 500 mg daily.  If becomes severe call clinic. 9.  Alcoholic beverages. 10.  Drink 2 to 3 quarts of water per day. 11.  Peripheral neuropathy-patient to call if numbness or tingling in hands or feet is persistent  Neulasta-will be given 24 to 48 hours after chemotherapy.  Gave information sheet on bone and joint pain.  Use Claritin or Pepcid.  May use ibuprofen or Aleve.  Call if symptoms persist or are unbearable.  Gave information on the supportive care team and how to contact them regarding services.  Discussed advanced directives.  The patient does not have their advanced directives but will look at the copy provided in their notebook and will call with any questions. [employer] Social worker Advanced directives  Answered questions to patient satisfaction.  Patient is to call with any further questions or concerns.  Time spent on this palliative care/chemotherapy education was 60 minutes with more than 50% spent discussing diagnosis, prognosis and symptom management.

## 2020-01-28 NOTE — Progress Notes (Signed)
..   Pharmacist Chemotherapy Monitoring - Initial Assessment    Anticipated start date: 01/29/20   Regimen:  . Are orders appropriate based on the patient's diagnosis, regimen, and cycle?  YES . Does the plan date match the patient's scheduled date?  YES . Is the sequencing of drugs appropriate?  YES . Are the premedications appropriate for the patient's regimen? YES . Prior Authorization for treatment is: YES o If applicable, is the correct biosimilar selected based on the patient's insurance? YES  Organ Function and Labs: Marland Kitchen Are dose adjustments needed based on the patient's renal function, hepatic function, or hematologic function? YES . Are appropriate labs ordered prior to the start of patient's treatment? YES . Other organ system assessment, if indicated: YES . The following baseline labs, if indicated, have been ordered: YES  Dose Assessment: . Are the drug doses appropriate? YES . Are the following correct: o Drug concentrations YES o IV fluid compatible with drug YES o Administration routes YES o Timing of therapy YES . If applicable, does the patient have documented access for treatment and/or plans for port-a-cath placement? YES . If applicable, have lifetime cumulative doses been properly documented and assessed? 1st time carboplatin  Toxicity Monitoring/Prevention: . The patient has the following take home antiemetics prescribed: YES . The patient has the following take home medications prescribed: YES . Medication allergies and previous infusion related reactions, if applicable, have been reviewed and addressed. Metformin only . The patient's current medication list has been assessed for drug-drug interactions with their chemotherapy regimen. Pt has diabetes and is on lantus  Order Review: . Are the treatment plan orders signed? YES . Is the patient scheduled to see a provider prior to their treatment? YES  I verify that I have reviewed each item in the above  checklist and answered each question accordingly.  Georga Hacking Shimika Ames 01/28/2020 1:57 PM

## 2020-01-29 ENCOUNTER — Inpatient Hospital Stay: Payer: Medicaid Other

## 2020-01-29 ENCOUNTER — Other Ambulatory Visit: Payer: Self-pay | Admitting: Hematology and Oncology

## 2020-01-29 VITALS — BP 165/75 | HR 105 | Temp 97.4°F | Resp 18 | Wt 165.5 lb

## 2020-01-29 DIAGNOSIS — G629 Polyneuropathy, unspecified: Secondary | ICD-10-CM | POA: Diagnosis not present

## 2020-01-29 DIAGNOSIS — D0512 Intraductal carcinoma in situ of left breast: Secondary | ICD-10-CM | POA: Diagnosis present

## 2020-01-29 DIAGNOSIS — Z171 Estrogen receptor negative status [ER-]: Secondary | ICD-10-CM | POA: Diagnosis not present

## 2020-01-29 DIAGNOSIS — Z5111 Encounter for antineoplastic chemotherapy: Secondary | ICD-10-CM | POA: Diagnosis present

## 2020-01-29 DIAGNOSIS — E876 Hypokalemia: Secondary | ICD-10-CM | POA: Diagnosis not present

## 2020-01-29 DIAGNOSIS — Z79899 Other long term (current) drug therapy: Secondary | ICD-10-CM | POA: Diagnosis not present

## 2020-01-29 DIAGNOSIS — C50512 Malignant neoplasm of lower-outer quadrant of left female breast: Secondary | ICD-10-CM

## 2020-01-29 DIAGNOSIS — F17210 Nicotine dependence, cigarettes, uncomplicated: Secondary | ICD-10-CM | POA: Diagnosis not present

## 2020-01-29 MED ORDER — FOSAPREPITANT DIMEGLUMINE INJECTION 150 MG
150.0000 mg | Freq: Once | INTRAVENOUS | Status: AC
  Administered 2020-01-29: 150 mg via INTRAVENOUS
  Filled 2020-01-29: qty 150

## 2020-01-29 MED ORDER — PALONOSETRON HCL INJECTION 0.25 MG/5ML
0.2500 mg | Freq: Once | INTRAVENOUS | Status: AC
Start: 2020-01-29 — End: 2020-01-29
  Administered 2020-01-29: 0.25 mg via INTRAVENOUS

## 2020-01-29 MED ORDER — DIPHENHYDRAMINE HCL 50 MG/ML IJ SOLN
50.0000 mg | Freq: Once | INTRAMUSCULAR | Status: AC
  Administered 2020-01-29: 50 mg via INTRAVENOUS

## 2020-01-29 MED ORDER — SODIUM CHLORIDE 0.9 % IV SOLN
10.0000 mg | Freq: Once | INTRAVENOUS | Status: AC
  Administered 2020-01-29: 10 mg via INTRAVENOUS
  Filled 2020-01-29: qty 1

## 2020-01-29 MED ORDER — SODIUM CHLORIDE 0.9 % IV SOLN
10.0000 mg | Freq: Once | INTRAVENOUS | Status: AC
  Administered 2020-01-29: 10 mg via INTRAVENOUS
  Filled 2020-01-29: qty 10

## 2020-01-29 MED ORDER — SODIUM CHLORIDE 0.9 % IV SOLN
Freq: Once | INTRAVENOUS | Status: AC
  Filled 2020-01-29: qty 250

## 2020-01-29 MED ORDER — TRASTUZUMAB-DKST CHEMO 150 MG IV SOLR
8.0000 mg/kg | Freq: Once | INTRAVENOUS | Status: AC
  Administered 2020-01-29: 630 mg via INTRAVENOUS
  Filled 2020-01-29: qty 30

## 2020-01-29 MED ORDER — HEPARIN SOD (PORK) LOCK FLUSH 100 UNIT/ML IV SOLN
500.0000 [IU] | Freq: Once | INTRAVENOUS | Status: AC | PRN
  Administered 2020-01-29: 500 [IU]
  Filled 2020-01-29: qty 5

## 2020-01-29 MED ORDER — DIPHENHYDRAMINE HCL 50 MG/ML IJ SOLN
INTRAMUSCULAR | Status: AC
  Filled 2020-01-29: qty 1

## 2020-01-29 MED ORDER — SODIUM CHLORIDE 0.9 % IV SOLN
840.0000 mg | Freq: Once | INTRAVENOUS | Status: AC
  Administered 2020-01-29: 840 mg via INTRAVENOUS
  Filled 2020-01-29: qty 28

## 2020-01-29 MED ORDER — ACETAMINOPHEN 325 MG PO TABS
ORAL_TABLET | ORAL | Status: AC
  Filled 2020-01-29: qty 2

## 2020-01-29 MED ORDER — ACETAMINOPHEN 325 MG PO TABS
650.0000 mg | ORAL_TABLET | Freq: Once | ORAL | Status: AC
  Administered 2020-01-29: 650 mg via ORAL

## 2020-01-29 MED ORDER — CARBOPLATIN CHEMO INJECTION 600 MG/60ML
670.8000 mg | Freq: Once | INTRAVENOUS | Status: AC
Start: 2020-01-29 — End: 2020-01-29
  Administered 2020-01-29: 670 mg via INTRAVENOUS
  Filled 2020-01-29: qty 67

## 2020-01-29 MED ORDER — SODIUM CHLORIDE 0.9 % IV SOLN
75.0000 mg/m2 | Freq: Once | INTRAVENOUS | Status: AC
  Administered 2020-01-29: 150 mg via INTRAVENOUS
  Filled 2020-01-29: qty 15

## 2020-01-29 NOTE — Patient Instructions (Signed)
Trastuzumab injection for infusion What is this medicine? TRASTUZUMAB (tras TOO zoo mab) is a monoclonal antibody. It is used to treat breast cancer and stomach cancer. This medicine may be used for other purposes; ask your health care provider or pharmacist if you have questions. COMMON BRAND NAME(S): Herceptin, Herzuma, KANJINTI, Ogivri, Ontruzant, Trazimera What should I tell my health care provider before I take this medicine? They need to know if you have any of these conditions:  heart disease  heart failure  lung or breathing disease, like asthma  an unusual or allergic reaction to trastuzumab, benzyl alcohol, or other medications, foods, dyes, or preservatives  pregnant or trying to get pregnant  breast-feeding How should I use this medicine? This drug is given as an infusion into a vein. It is administered in a hospital or clinic by a specially trained health care professional. Talk to your pediatrician regarding the use of this medicine in children. This medicine is not approved for use in children. Overdosage: If you think you have taken too much of this medicine contact a poison control center or emergency room at once. NOTE: This medicine is only for you. Do not share this medicine with others. What if I miss a dose? It is important not to miss a dose. Call your doctor or health care professional if you are unable to keep an appointment. What may interact with this medicine? This medicine may interact with the following medications:  certain types of chemotherapy, such as daunorubicin, doxorubicin, epirubicin, and idarubicin This list may not describe all possible interactions. Give your health care provider a list of all the medicines, herbs, non-prescription drugs, or dietary supplements you use. Also tell them if you smoke, drink alcohol, or use illegal drugs. Some items may interact with your medicine. What should I watch for while using this medicine? Visit your  doctor for checks on your progress. Report any side effects. Continue your course of treatment even though you feel ill unless your doctor tells you to stop. Call your doctor or health care professional for advice if you get a fever, chills or sore throat, or other symptoms of a cold or flu. Do not treat yourself. Try to avoid being around people who are sick. You may experience fever, chills and shaking during your first infusion. These effects are usually mild and can be treated with other medicines. Report any side effects during the infusion to your health care professional. Fever and chills usually do not happen with later infusions. Do not become pregnant while taking this medicine or for 7 months after stopping it. Women should inform their doctor if they wish to become pregnant or think they might be pregnant. Women of child-bearing potential will need to have a negative pregnancy test before starting this medicine. There is a potential for serious side effects to an unborn child. Talk to your health care professional or pharmacist for more information. Do not breast-feed an infant while taking this medicine or for 7 months after stopping it. Women must use effective birth control with this medicine. What side effects may I notice from receiving this medicine? Side effects that you should report to your doctor or health care professional as soon as possible:  allergic reactions like skin rash, itching or hives, swelling of the face, lips, or tongue  chest pain or palpitations  cough  dizziness  feeling faint or lightheaded, falls  fever  general ill feeling or flu-like symptoms  signs of worsening heart failure like   breathing problems; swelling in your legs and feet  unusually weak or tired Side effects that usually do not require medical attention (report to your doctor or health care professional if they continue or are bothersome):  bone pain  changes in  taste  diarrhea  joint pain  nausea/vomiting  weight loss This list may not describe all possible side effects. Call your doctor for medical advice about side effects. You may report side effects to FDA at 1-800-FDA-1088. Where should I keep my medicine? This drug is given in a hospital or clinic and will not be stored at home. NOTE: This sheet is a summary. It may not cover all possible information. If you have questions about this medicine, talk to your doctor, pharmacist, or health care provider.  2020 Elsevier/Gold Standard (2016-01-12 14:37:52) Pertuzumab injection What is this medicine? PERTUZUMAB (per TOOZ ue mab) is a monoclonal antibody. It is used to treat breast cancer. This medicine may be used for other purposes; ask your health care provider or pharmacist if you have questions. COMMON BRAND NAME(S): PERJETA What should I tell my health care provider before I take this medicine? They need to know if you have any of these conditions:  heart disease  heart failure  high blood pressure  history of irregular heart beat  recent or ongoing radiation therapy  an unusual or allergic reaction to pertuzumab, other medicines, foods, dyes, or preservatives  pregnant or trying to get pregnant  breast-feeding How should I use this medicine? This medicine is for infusion into a vein. It is given by a health care professional in a hospital or clinic setting. Talk to your pediatrician regarding the use of this medicine in children. Special care may be needed. Overdosage: If you think you have taken too much of this medicine contact a poison control center or emergency room at once. NOTE: This medicine is only for you. Do not share this medicine with others. What if I miss a dose? It is important not to miss your dose. Call your doctor or health care professional if you are unable to keep an appointment. What may interact with this medicine? Interactions are not  expected. Give your health care provider a list of all the medicines, herbs, non-prescription drugs, or dietary supplements you use. Also tell them if you smoke, drink alcohol, or use illegal drugs. Some items may interact with your medicine. This list may not describe all possible interactions. Give your health care provider a list of all the medicines, herbs, non-prescription drugs, or dietary supplements you use. Also tell them if you smoke, drink alcohol, or use illegal drugs. Some items may interact with your medicine. What should I watch for while using this medicine? Your condition will be monitored carefully while you are receiving this medicine. Report any side effects. Continue your course of treatment even though you feel ill unless your doctor tells you to stop. Do not become pregnant while taking this medicine or for 7 months after stopping it. Women should inform their doctor if they wish to become pregnant or think they might be pregnant. Women of child-bearing potential will need to have a negative pregnancy test before starting this medicine. There is a potential for serious side effects to an unborn child. Talk to your health care professional or pharmacist for more information. Do not breast-feed an infant while taking this medicine or for 7 months after stopping it. Women must use effective birth control with this medicine. Call your doctor or health   care professional for advice if you get a fever, chills or sore throat, or other symptoms of a cold or flu. Do not treat yourself. Try to avoid being around people who are sick. You may experience fever, chills, and headache during the infusion. Report any side effects during the infusion to your health care professional. What side effects may I notice from receiving this medicine? Side effects that you should report to your doctor or health care professional as soon as possible:  breathing problems  chest pain or  palpitations  dizziness  feeling faint or lightheaded  fever or chills  skin rash, itching or hives  sore throat  swelling of the face, lips, or tongue  swelling of the legs or ankles  unusually weak or tired Side effects that usually do not require medical attention (report to your doctor or health care professional if they continue or are bothersome):  diarrhea  hair loss  nausea, vomiting  tiredness This list may not describe all possible side effects. Call your doctor for medical advice about side effects. You may report side effects to FDA at 1-800-FDA-1088. Where should I keep my medicine? This drug is given in a hospital or clinic and will not be stored at home. NOTE: This sheet is a summary. It may not cover all possible information. If you have questions about this medicine, talk to your doctor, pharmacist, or health care provider.  2020 Elsevier/Gold Standard (2015-02-19 12:08:50) Carboplatin injection What is this medicine? CARBOPLATIN (KAR boe pla tin) is a chemotherapy drug. It targets fast dividing cells, like cancer cells, and causes these cells to die. This medicine is used to treat ovarian cancer and many other cancers. This medicine may be used for other purposes; ask your health care provider or pharmacist if you have questions. COMMON BRAND NAME(S): Paraplatin What should I tell my health care provider before I take this medicine? They need to know if you have any of these conditions:  blood disorders  hearing problems  kidney disease  recent or ongoing radiation therapy  an unusual or allergic reaction to carboplatin, cisplatin, other chemotherapy, other medicines, foods, dyes, or preservatives  pregnant or trying to get pregnant  breast-feeding How should I use this medicine? This drug is usually given as an infusion into a vein. It is administered in a hospital or clinic by a specially trained health care professional. Talk to your  pediatrician regarding the use of this medicine in children. Special care may be needed. Overdosage: If you think you have taken too much of this medicine contact a poison control center or emergency room at once. NOTE: This medicine is only for you. Do not share this medicine with others. What if I miss a dose? It is important not to miss a dose. Call your doctor or health care professional if you are unable to keep an appointment. What may interact with this medicine?  medicines for seizures  medicines to increase blood counts like filgrastim, pegfilgrastim, sargramostim  some antibiotics like amikacin, gentamicin, neomycin, streptomycin, tobramycin  vaccines Talk to your doctor or health care professional before taking any of these medicines:  acetaminophen  aspirin  ibuprofen  ketoprofen  naproxen This list may not describe all possible interactions. Give your health care provider a list of all the medicines, herbs, non-prescription drugs, or dietary supplements you use. Also tell them if you smoke, drink alcohol, or use illegal drugs. Some items may interact with your medicine. What should I watch for while  using this medicine? Your condition will be monitored carefully while you are receiving this medicine. You will need important blood work done while you are taking this medicine. This drug may make you feel generally unwell. This is not uncommon, as chemotherapy can affect healthy cells as well as cancer cells. Report any side effects. Continue your course of treatment even though you feel ill unless your doctor tells you to stop. In some cases, you may be given additional medicines to help with side effects. Follow all directions for their use. Call your doctor or health care professional for advice if you get a fever, chills or sore throat, or other symptoms of a cold or flu. Do not treat yourself. This drug decreases your body's ability to fight infections. Try to avoid being  around people who are sick. This medicine may increase your risk to bruise or bleed. Call your doctor or health care professional if you notice any unusual bleeding. Be careful brushing and flossing your teeth or using a toothpick because you may get an infection or bleed more easily. If you have any dental work done, tell your dentist you are receiving this medicine. Avoid taking products that contain aspirin, acetaminophen, ibuprofen, naproxen, or ketoprofen unless instructed by your doctor. These medicines may hide a fever. Do not become pregnant while taking this medicine. Women should inform their doctor if they wish to become pregnant or think they might be pregnant. There is a potential for serious side effects to an unborn child. Talk to your health care professional or pharmacist for more information. Do not breast-feed an infant while taking this medicine. What side effects may I notice from receiving this medicine? Side effects that you should report to your doctor or health care professional as soon as possible:  allergic reactions like skin rash, itching or hives, swelling of the face, lips, or tongue  signs of infection - fever or chills, cough, sore throat, pain or difficulty passing urine  signs of decreased platelets or bleeding - bruising, pinpoint red spots on the skin, black, tarry stools, nosebleeds  signs of decreased red blood cells - unusually weak or tired, fainting spells, lightheadedness  breathing problems  changes in hearing  changes in vision  chest pain  high blood pressure  low blood counts - This drug may decrease the number of white blood cells, red blood cells and platelets. You may be at increased risk for infections and bleeding.  nausea and vomiting  pain, swelling, redness or irritation at the injection site  pain, tingling, numbness in the hands or feet  problems with balance, talking, walking  trouble passing urine or change in the amount  of urine Side effects that usually do not require medical attention (report to your doctor or health care professional if they continue or are bothersome):  hair loss  loss of appetite  metallic taste in the mouth or changes in taste This list may not describe all possible side effects. Call your doctor for medical advice about side effects. You may report side effects to FDA at 1-800-FDA-1088. Where should I keep my medicine? This drug is given in a hospital or clinic and will not be stored at home. NOTE: This sheet is a summary. It may not cover all possible information. If you have questions about this medicine, talk to your doctor, pharmacist, or health care provider.  2020 Elsevier/Gold Standard (2007-04-24 14:38:05) Docetaxel injection What is this medicine? DOCETAXEL (doe se TAX el) is a chemotherapy drug.  It targets fast dividing cells, like cancer cells, and causes these cells to die. This medicine is used to treat many types of cancers like breast cancer, certain stomach cancers, head and neck cancer, lung cancer, and prostate cancer. This medicine may be used for other purposes; ask your health care provider or pharmacist if you have questions. COMMON BRAND NAME(S): Docefrez, Taxotere What should I tell my health care provider before I take this medicine? They need to know if you have any of these conditions:  infection (especially a virus infection such as chickenpox, cold sores, or herpes)  liver disease  low blood counts, like low white cell, platelet, or red cell counts  an unusual or allergic reaction to docetaxel, polysorbate 80, other chemotherapy agents, other medicines, foods, dyes, or preservatives  pregnant or trying to get pregnant  breast-feeding How should I use this medicine? This drug is given as an infusion into a vein. It is administered in a hospital or clinic by a specially trained health care professional. Talk to your pediatrician regarding the use  of this medicine in children. Special care may be needed. Overdosage: If you think you have taken too much of this medicine contact a poison control center or emergency room at once. NOTE: This medicine is only for you. Do not share this medicine with others. What if I miss a dose? It is important not to miss your dose. Call your doctor or health care professional if you are unable to keep an appointment. What may interact with this medicine?  aprepitant  certain antibiotics like erythromycin or clarithromycin  certain antivirals for HIV or hepatitis  certain medicines for fungal infections like fluconazole, itraconazole, ketoconazole, posaconazole, or voriconazole  cimetidine  ciprofloxacin  conivaptan  cyclosporine  dronedarone  fluvoxamine  grapefruit juice  imatinib  verapamil This list may not describe all possible interactions. Give your health care provider a list of all the medicines, herbs, non-prescription drugs, or dietary supplements you use. Also tell them if you smoke, drink alcohol, or use illegal drugs. Some items may interact with your medicine. What should I watch for while using this medicine? Your condition will be monitored carefully while you are receiving this medicine. You will need important blood work done while you are taking this medicine. Call your doctor or health care professional for advice if you get a fever, chills or sore throat, or other symptoms of a cold or flu. Do not treat yourself. This drug decreases your body's ability to fight infections. Try to avoid being around people who are sick. Some products may contain alcohol. Ask your health care professional if this medicine contains alcohol. Be sure to tell all health care professionals you are taking this medicine. Certain medicines, like metronidazole and disulfiram, can cause an unpleasant reaction when taken with alcohol. The reaction includes flushing, headache, nausea, vomiting,  sweating, and increased thirst. The reaction can last from 30 minutes to several hours. You may get drowsy or dizzy. Do not drive, use machinery, or do anything that needs mental alertness until you know how this medicine affects you. Do not stand or sit up quickly, especially if you are an older patient. This reduces the risk of dizzy or fainting spells. Alcohol may interfere with the effect of this medicine. Talk to your health care professional about your risk of cancer. You may be more at risk for certain types of cancer if you take this medicine. Do not become pregnant while taking this medicine or for  6 months after stopping it. Women should inform their doctor if they wish to become pregnant or think they might be pregnant. There is a potential for serious side effects to an unborn child. Talk to your health care professional or pharmacist for more information. Do not breast-feed an infant while taking this medicine or for 1 week after stopping it. Males who get this medicine must use a condom during sex with females who can get pregnant. If you get a woman pregnant, the baby could have birth defects. The baby could die before they are born. You will need to continue wearing a condom for 3 months after stopping the medicine. Tell your health care provider right away if your partner becomes pregnant while you are taking this medicine. This may interfere with the ability to father a child. You should talk to your doctor or health care professional if you are concerned about your fertility. What side effects may I notice from receiving this medicine? Side effects that you should report to your doctor or health care professional as soon as possible:  allergic reactions like skin rash, itching or hives, swelling of the face, lips, or tongue  blurred vision  breathing problems  changes in vision  low blood counts - This drug may decrease the number of white blood cells, red blood cells and  platelets. You may be at increased risk for infections and bleeding.  nausea and vomiting  pain, redness or irritation at site where injected  pain, tingling, numbness in the hands or feet  redness, blistering, peeling, or loosening of the skin, including inside the mouth  signs of decreased platelets or bleeding - bruising, pinpoint red spots on the skin, black, tarry stools, nosebleeds  signs of decreased red blood cells - unusually weak or tired, fainting spells, lightheadedness  signs of infection - fever or chills, cough, sore throat, pain or difficulty passing urine  swelling of the ankle, feet, hands Side effects that usually do not require medical attention (report to your doctor or health care professional if they continue or are bothersome):  constipation  diarrhea  fingernail or toenail changes  hair loss  loss of appetite  mouth sores  muscle pain This list may not describe all possible side effects. Call your doctor for medical advice about side effects. You may report side effects to FDA at 1-800-FDA-1088. Where should I keep my medicine? This drug is given in a hospital or clinic and will not be stored at home. NOTE: This sheet is a summary. It may not cover all possible information. If you have questions about this medicine, talk to your doctor, pharmacist, or health care provider.  2020 Elsevier/Gold Standard (2018-09-13 10:19:06)

## 2020-01-29 NOTE — Progress Notes (Signed)
PT STABLE AT TIME OF DISCHARGE 

## 2020-01-30 ENCOUNTER — Telehealth: Payer: Self-pay

## 2020-01-30 ENCOUNTER — Other Ambulatory Visit: Payer: Self-pay

## 2020-01-30 ENCOUNTER — Inpatient Hospital Stay: Payer: Medicaid Other

## 2020-01-30 DIAGNOSIS — Z171 Estrogen receptor negative status [ER-]: Secondary | ICD-10-CM

## 2020-01-30 DIAGNOSIS — Z5111 Encounter for antineoplastic chemotherapy: Secondary | ICD-10-CM | POA: Diagnosis not present

## 2020-01-30 MED ORDER — PEGFILGRASTIM-JMDB 6 MG/0.6ML ~~LOC~~ SOSY
6.0000 mg | PREFILLED_SYRINGE | Freq: Once | SUBCUTANEOUS | Status: AC
  Administered 2020-01-30: 6 mg via SUBCUTANEOUS

## 2020-01-30 MED ORDER — PEGFILGRASTIM-JMDB 6 MG/0.6ML ~~LOC~~ SOSY
PREFILLED_SYRINGE | SUBCUTANEOUS | Status: AC
  Filled 2020-01-30: qty 0.6

## 2020-01-30 NOTE — Telephone Encounter (Signed)
I called pt to check on her since infusion yesterday. Pt states she slept really well. She had some intermittent nausea- but used antiemetic w/relief. Reminded pt of importance of calling us day or night if temp is 100.4 or greater. Pt verbalized understanding. [phone]

## 2020-01-30 NOTE — Progress Notes (Signed)
Patient is needing assistance with paying for her medications at the pharmacy. Asked that she bring in her 24 from 2020 and we can try to get her enrolled into funding.

## 2020-01-30 NOTE — Progress Notes (Signed)
1551: PT STABLE AT TIME OF DISCHARGE  

## 2020-01-30 NOTE — Patient Instructions (Signed)

## 2020-02-03 ENCOUNTER — Other Ambulatory Visit: Payer: Self-pay | Admitting: Hematology and Oncology

## 2020-02-03 ENCOUNTER — Telehealth: Payer: Self-pay

## 2020-02-03 MED ORDER — DEXAMETHASONE 4 MG PO TABS: ORAL_TABLET | ORAL | 0 refills | Status: DC

## 2020-02-03 MED ORDER — PROCHLORPERAZINE MALEATE 10 MG PO TABS: 10.0000 mg | ORAL_TABLET | Freq: Four times a day (QID) | ORAL | 3 refills | Status: DC | PRN

## 2020-02-03 MED ORDER — ONDANSETRON HCL 4 MG PO TABS: 4.0000 mg | ORAL_TABLET | ORAL | 3 refills | Status: DC | PRN

## 2020-02-03 NOTE — Progress Notes (Signed)
Patient was approved for the [employer], 02/03/2020.

## 2020-02-03 NOTE — Telephone Encounter (Addendum)
I notified Brayton Caves that Ilda Basset had sent in eRx.   ----- Message from Pascal Lux, NP sent at 02/03/2020 10:17 AM EST ----- Regarding: RE: Medications Done. Thank you!  ----- Message ----- From: Hipolito Bayley, RN Sent: 02/03/2020  10:06 AM EST To: Pascal Lux, NP Subject: Medications                                    I received request from Jessie,SW. Pt needs assistance with cost of meds. Could you please send the zofram, compazine, & decadron to [facility] (originally sent to [facility])?

## 2020-02-07 ENCOUNTER — Telehealth: Payer: Self-pay | Admitting: Hematology and Oncology

## 2020-02-07 ENCOUNTER — Inpatient Hospital Stay: Payer: PRIVATE HEALTH INSURANCE | Admitting: Hematology and Oncology

## 2020-02-07 ENCOUNTER — Inpatient Hospital Stay: Payer: PRIVATE HEALTH INSURANCE

## 2020-02-07 NOTE — Telephone Encounter (Signed)
Patient called to rescheduled 1/7 Labs, Follow Up to 1/14 - Scheduled 1/19 Inf, 1/21 Inj - 2/9 Inf, 2/11 Inj.  Please give patient updated Appt Summary on 1/14 Visit

## 2020-02-13 ENCOUNTER — Other Ambulatory Visit: Payer: Self-pay | Admitting: Pharmacist

## 2020-02-14 ENCOUNTER — Encounter: Payer: Self-pay | Admitting: Hematology and Oncology

## 2020-02-14 ENCOUNTER — Inpatient Hospital Stay: Payer: PRIVATE HEALTH INSURANCE | Attending: Hematology and Oncology

## 2020-02-14 ENCOUNTER — Telehealth: Payer: Self-pay | Admitting: Hematology and Oncology

## 2020-02-14 ENCOUNTER — Inpatient Hospital Stay (INDEPENDENT_AMBULATORY_CARE_PROVIDER_SITE_OTHER): Payer: PRIVATE HEALTH INSURANCE | Admitting: Hematology and Oncology

## 2020-02-14 ENCOUNTER — Other Ambulatory Visit: Payer: Self-pay

## 2020-02-14 VITALS — BP 170/88 | HR 105 | Temp 97.9°F | Resp 16 | Ht 69.0 in | Wt 166.5 lb

## 2020-02-14 DIAGNOSIS — Z170 Estrogen receptor positive status [ER+]: Secondary | ICD-10-CM | POA: Insufficient documentation

## 2020-02-14 DIAGNOSIS — C50512 Malignant neoplasm of lower-outer quadrant of left female breast: Secondary | ICD-10-CM | POA: Diagnosis not present

## 2020-02-14 DIAGNOSIS — D0512 Intraductal carcinoma in situ of left breast: Secondary | ICD-10-CM | POA: Insufficient documentation

## 2020-02-14 DIAGNOSIS — Z79899 Other long term (current) drug therapy: Secondary | ICD-10-CM | POA: Insufficient documentation

## 2020-02-14 DIAGNOSIS — D72829 Elevated white blood cell count, unspecified: Secondary | ICD-10-CM | POA: Insufficient documentation

## 2020-02-14 DIAGNOSIS — Z171 Estrogen receptor negative status [ER-]: Secondary | ICD-10-CM

## 2020-02-14 DIAGNOSIS — Z5111 Encounter for antineoplastic chemotherapy: Secondary | ICD-10-CM | POA: Insufficient documentation

## 2020-02-14 DIAGNOSIS — F17210 Nicotine dependence, cigarettes, uncomplicated: Secondary | ICD-10-CM | POA: Insufficient documentation

## 2020-02-14 DIAGNOSIS — G629 Polyneuropathy, unspecified: Secondary | ICD-10-CM | POA: Insufficient documentation

## 2020-02-14 LAB — CBC AND DIFFERENTIAL
HCT: 39 (ref 36–46)
Hemoglobin: 13.2 (ref 12.0–16.0)
Neutrophils Absolute: 9.46
Platelets: 163 (ref 150–399)
WBC: 11.4

## 2020-02-14 LAB — BASIC METABOLIC PANEL
BUN: 11 (ref 4–21)
CO2: 27 — AB (ref 13–22)
Chloride: 108 (ref 99–108)
Creatinine: 0.8 (ref 0.5–1.1)
Glucose: 119
Potassium: 3.7 (ref 3.4–5.3)
Sodium: 141 (ref 137–147)

## 2020-02-14 LAB — COMPREHENSIVE METABOLIC PANEL
Albumin: 4.2 (ref 3.5–5.0)
Calcium: 9.9 (ref 8.7–10.7)

## 2020-02-14 LAB — HEPATIC FUNCTION PANEL
ALT: 14 (ref 7–35)
AST: 20 (ref 13–35)
Alkaline Phosphatase: 118 (ref 25–125)
Bilirubin, Total: 0.4

## 2020-02-14 LAB — CBC: RBC: 4.67 (ref 3.87–5.11)

## 2020-02-14 NOTE — Progress Notes (Signed)
[facility]  [address] 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   HISTORY OF PRESENT ILLNESS:  Brittney Burgess is a 60 y.o. 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  Screening mammogram in August which revealed a mass with calcifications, a second asymmetry and possible dilated ducts within the left breast.  Right breast was negative.  Diagnostic unilateral left mammogram and left breast ultrasound confirmed a 1.6 cm elongated mass containing pleomorphic calcifications at 7 o'clock, a 5 mm hypoechoic indeterminate mass containing 2 benign-appearing calcification at 2 o'clock, and several retroareolar ecstatic ducts containing internal debris or possible tissue.  Ultrasound guided biopsy on September 20th of the mass at 5 o'clock revealed grade 3, invasive ductal carcinoma, grade 3.  Estrogen and progesterone receptors were negative with HER2 positive.  Ki67 was 50%. 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  The mass at 2 o'clock was consistent with fibrocystic changes, including sclerosing adenosis with calcifications.  No atypia, in situ or invasive malignancy.   Biopsy at 11:30 o'clock revealed a fragmented intraductal papilloma and dilated duct.  No atypia, in situ or invasive malignancy identified.  She underwent lumpectomy with sentinel lymph node biopsy on November 22nd.  Surgical pathology from this procedure revealed a 1.2 cm, grade 3, invasive ductal carcinoma with high grade ductal carcinoma in situ.  One sentinel lymph node was negative for malignancy.  Margins were negative.  She also had  placement of a port at that time.  She had a hysterectomy and bilateral salpingo oophorectomy in 2004 due to menorrhagia.  She is unsure if she was given hormone replacement.  She has baseline neuropathy of her feet secondary to diabetes.  She started adjuvant chemotherapy/HER2 targeted therapy with docetaxel/carboplatin/trastuzumab/pertuzumab [facility]) on December 29th.   INTERVAL HISTORY:  [name] is here for prior to her 2nd cycle of TCHP.  She states she has mild fatigue, but otherwise tolerated her 1st cycle without significant difficulty.  She denies fevers or chills.  She denies pain.  Her appetite is good. Her weight has increased 1.5 pounds over last 3 weeks.    REVIEW OF SYSTEMS:  Review of Systems  Constitutional: Positive for fatigue (mild). Negative for appetite change, chills, fever and unexpected weight change.  HENT:   Negative for lump/mass, mouth sores and sore throat.   Respiratory: Negative for cough and shortness of breath.   Cardiovascular: Negative for chest pain and leg swelling.  Gastrointestinal: Negative for abdominal pain, constipation, diarrhea, nausea and vomiting.  Genitourinary: Negative for difficulty urinating, dysuria, frequency and hematuria.   Musculoskeletal: Negative for arthralgias, back pain and myalgias.  Skin: Negative for rash.  Neurological: Positive for numbness (bilateral feet, stable). Negative for dizziness, extremity weakness and headaches.  Hematological: Negative for adenopathy.  Psychiatric/Behavioral: Negative for depression and sleep disturbance. The patient is not nervous/anxious.      VITALS:  Blood pressure (!) 170/88, pulse (!) 105, temperature 97.9 F (36.6 C), resp. rate 16, height _0  (1.753 m), weight 166 lb 8 oz (75.5 kg), SpO2 98 %.  Wt Readings from Last 3 Encounters:  02/14/20 166 lb 8 oz (75.5 kg)  01/29/20 165 lb 8 oz (75.1 kg)  01/28/20 165 lb 14.4 oz (75.3 kg)    Body mass index is 24.59 kg/m.  Performance status  (ECOG): 1 - Symptomatic but completely ambulatory  PHYSICAL EXAM:  Physical Exam Vitals and nursing note reviewed.  Constitutional:      General: She is not in acute distress.    Appearance: Normal appearance. She is normal weight.  HENT:     Mouth/Throat:     Mouth: Mucous membranes are moist.     Pharynx: Oropharynx is clear. No oropharyngeal exudate.  Eyes:     General: No scleral icterus.    Extraocular Movements: Extraocular movements intact.     Pupils: Pupils are equal, round, and reactive to light.  Cardiovascular:     Rate and Rhythm: Normal rate and regular rhythm.     Heart sounds: Normal heart sounds. No murmur heard. No friction rub. No gallop.   Pulmonary:     Effort: Pulmonary effort is normal. No respiratory distress.     Breath sounds: Normal breath sounds. No stridor. No wheezing, rhonchi or rales.  Chest:  Breasts:     Right: Normal. No swelling, bleeding, inverted nipple, mass, nipple discharge, tenderness, axillary adenopathy or supraclavicular adenopathy.     Left: Normal. No swelling, bleeding, inverted nipple, mass, nipple discharge, tenderness, axillary adenopathy or supraclavicular adenopathy.    Abdominal:     General: There is no distension.     Palpations: Abdomen is soft. There is no hepatomegaly, splenomegaly or mass.     Tenderness: There is no abdominal tenderness. There is no guarding.     Hernia: No hernia is present.  Musculoskeletal:        General: Normal range of motion.     Cervical back: Normal range of motion and neck supple. No tenderness.     Right lower leg: No edema.     Left lower leg: No edema.  Lymphadenopathy:     Cervical: No cervical adenopathy.     Upper Body:     Right upper body: No supraclavicular or axillary adenopathy.     Left upper body: No supraclavicular or axillary adenopathy.     Lower Body: No right inguinal adenopathy. No left inguinal adenopathy.  Skin:    General: Skin is warm.     Coloration: Skin is  not jaundiced.     Findings: No rash.  Neurological:     Mental Status: She is alert and oriented to person, place, and time.     Cranial Nerves: No cranial nerve deficit.  Psychiatric:        Mood and Affect: Mood normal.        Behavior: Behavior normal.        Thought Content: Thought content normal.     LABS:   CBC Latest Ref Rng & Units 02/14/2020 01/28/2020 11/11/2019  WBC - 11.4 9.6 12.3  Hemoglobin 12.0 - 16.0 13.2 13.0 12.5  Hematocrit 36 - 46 39 39 37  Platelets 150 - 399 163 250 291   CMP Latest Ref Rng & Units 02/14/2020 01/28/2020 01/28/2020  BUN 4 - _0 -  Creatinine 0.5 - 1.1 0.8 0.9 0.8  Sodium 137 - 147 141 140 -  Potassium 3.4 - 5.3 3.7 3.8 -  Chloride 99 - 108 108 107 -  CO2 13 - 22 27(A) 25(A) -  Calcium 8.7 - 10.7 9.9 - -  Alkaline Phos  25 - 125 118 - -  AST 13 - 35 20 - -  ALT 7 - 35 14 - -    STUDIES:    Allergies:  Allergies  Allergen Reactions  . Metformin And Related Other (See Comments)    Twitching, nausea    Current Medications: Current Outpatient Medications  Medication Sig Dispense Refill  . amLODipine (NORVASC) 5 MG tablet Take by mouth.    . dexamethasone (DECADRON) 4 MG tablet Take 2 tabs twice daily the day before chemotherapy and for 2 days after chemotherapy 60 tablet 0  . hydrochlorothiazide (HYDRODIURIL) 25 MG tablet Take by mouth.    . insulin glargine (LANTUS) 100 UNIT/ML Solostar Pen Inject into the skin. 36-41 units    . losartan (COZAAR) 50 MG tablet Take by mouth.    . ondansetron (ZOFRAN) 4 MG tablet Take 1 tablet (4 mg total) by mouth every 4 (four) hours as needed for nausea. 90 tablet 3  . prochlorperazine (COMPAZINE) 10 MG tablet Take 1 tablet (10 mg total) by mouth every 6 (six) hours as needed for nausea or vomiting. 90 tablet 3   No current facility-administered medications for this visit.     ASSESSMENT & PLAN:   Assessment:   1. Stage IB (T1cN0M0) HER2 positive invasive ductal carcinoma and high  grade ductal carcinoma in situ.   She is receiving adjuvant TCHP IV every 3 weeks for 6 cycles, with plans to continue with maintenance Herceptin/Perjeta for a total of 1 year. 2. Hypokalemia, resolved with increasing potassium in her diet. 3. Neuropathy of bilateral feet.  This is currently well controlled. 4. Tobacco abuse.  We discussed the importance of smoking cessation.  She has a prescription for Nicorette gum. 5. Mild leukocytosis most likely secondary to Neulasta.  The patient does not have any evidence of infection.  Plan:  We will proceed with a 2nd cycle of TCHP on January 19th.  We will plan to see her back in 3 weeks with a CBC and comprehensive metabolic panel prior to a 3rd cycle.  The patient understands the plans discussed today and is in agreement with them.  She knows to contact our office if she develops concerns prior to her next appointment.    Marvia Pickles, PA-C [facility] [address] [place] [address] Dept: [phone] Dept Fax: [phone]

## 2020-02-14 NOTE — Telephone Encounter (Signed)
Per 1/14 los next appt sched and given to patient °

## 2020-02-17 ENCOUNTER — Ambulatory Visit: Payer: PRIVATE HEALTH INSURANCE | Admitting: Hematology and Oncology

## 2020-02-17 ENCOUNTER — Other Ambulatory Visit: Payer: Self-pay | Admitting: Oncology

## 2020-02-19 ENCOUNTER — Inpatient Hospital Stay: Payer: PRIVATE HEALTH INSURANCE

## 2020-02-19 ENCOUNTER — Other Ambulatory Visit: Payer: Self-pay

## 2020-02-19 ENCOUNTER — Ambulatory Visit: Payer: PRIVATE HEALTH INSURANCE

## 2020-02-19 ENCOUNTER — Other Ambulatory Visit: Payer: Self-pay | Admitting: Hematology and Oncology

## 2020-02-19 VITALS — BP 169/79 | HR 103 | Temp 98.2°F | Resp 18 | Ht 69.0 in | Wt 164.8 lb

## 2020-02-19 DIAGNOSIS — F17210 Nicotine dependence, cigarettes, uncomplicated: Secondary | ICD-10-CM | POA: Diagnosis not present

## 2020-02-19 DIAGNOSIS — C50512 Malignant neoplasm of lower-outer quadrant of left female breast: Secondary | ICD-10-CM

## 2020-02-19 DIAGNOSIS — D0512 Intraductal carcinoma in situ of left breast: Secondary | ICD-10-CM | POA: Diagnosis present

## 2020-02-19 DIAGNOSIS — G629 Polyneuropathy, unspecified: Secondary | ICD-10-CM | POA: Diagnosis not present

## 2020-02-19 DIAGNOSIS — Z170 Estrogen receptor positive status [ER+]: Secondary | ICD-10-CM | POA: Diagnosis not present

## 2020-02-19 DIAGNOSIS — Z79899 Other long term (current) drug therapy: Secondary | ICD-10-CM | POA: Diagnosis not present

## 2020-02-19 DIAGNOSIS — Z5111 Encounter for antineoplastic chemotherapy: Secondary | ICD-10-CM | POA: Diagnosis present

## 2020-02-19 DIAGNOSIS — D72829 Elevated white blood cell count, unspecified: Secondary | ICD-10-CM | POA: Diagnosis not present

## 2020-02-19 MED ORDER — TRASTUZUMAB-DKST CHEMO 150 MG IV SOLR
6.0000 mg/kg | Freq: Once | INTRAVENOUS | Status: AC
  Administered 2020-02-19: 462 mg via INTRAVENOUS
  Filled 2020-02-19: qty 22

## 2020-02-19 MED ORDER — ACETAMINOPHEN 325 MG PO TABS
650.0000 mg | ORAL_TABLET | Freq: Once | ORAL | Status: AC
  Administered 2020-02-19: 650 mg via ORAL

## 2020-02-19 MED ORDER — PALONOSETRON HCL INJECTION 0.25 MG/5ML
INTRAVENOUS | Status: AC
  Filled 2020-02-19: qty 5

## 2020-02-19 MED ORDER — DIPHENHYDRAMINE HCL 50 MG/ML IJ SOLN
25.0000 mg | Freq: Once | INTRAMUSCULAR | Status: AC
  Administered 2020-02-19: 25 mg via INTRAVENOUS

## 2020-02-19 MED ORDER — PALONOSETRON HCL INJECTION 0.25 MG/5ML
0.2500 mg | Freq: Once | INTRAVENOUS | Status: AC
  Administered 2020-02-19: 0.25 mg via INTRAVENOUS

## 2020-02-19 MED ORDER — DIPHENHYDRAMINE HCL 50 MG/ML IJ SOLN
INTRAMUSCULAR | Status: AC
  Filled 2020-02-19: qty 1

## 2020-02-19 MED ORDER — SODIUM CHLORIDE 0.9 % IV SOLN
Freq: Once | INTRAVENOUS | Status: AC
  Filled 2020-02-19: qty 250

## 2020-02-19 MED ORDER — DEXAMETHASONE SODIUM PHOSPHATE 100 MG/10ML IJ SOLN
10.0000 mg | Freq: Once | INTRAMUSCULAR | Status: AC
  Administered 2020-02-19: 10 mg via INTRAVENOUS
  Filled 2020-02-19: qty 10

## 2020-02-19 MED ORDER — SODIUM CHLORIDE 0.9 % IV SOLN
150.0000 mg | Freq: Once | INTRAVENOUS | Status: AC
  Administered 2020-02-19: 150 mg via INTRAVENOUS
  Filled 2020-02-19: qty 150

## 2020-02-19 MED ORDER — SODIUM CHLORIDE 0.9 % IV SOLN
670.8000 mg | Freq: Once | INTRAVENOUS | Status: AC
  Administered 2020-02-19: 670 mg via INTRAVENOUS
  Filled 2020-02-19: qty 67

## 2020-02-19 MED ORDER — HEPARIN SOD (PORK) LOCK FLUSH 100 UNIT/ML IV SOLN
500.0000 [IU] | Freq: Once | INTRAVENOUS | Status: AC | PRN
  Administered 2020-02-19: 500 [IU]
  Filled 2020-02-19: qty 5

## 2020-02-19 MED ORDER — ACETAMINOPHEN 325 MG PO TABS
ORAL_TABLET | ORAL | Status: AC
  Filled 2020-02-19: qty 2

## 2020-02-19 MED ORDER — DOCETAXEL CHEMO INJECTION 160 MG/16ML
75.0000 mg/m2 | Freq: Once | INTRAVENOUS | Status: AC
  Administered 2020-02-19: 150 mg via INTRAVENOUS
  Filled 2020-02-19 (×2): qty 15

## 2020-02-19 MED ORDER — SODIUM CHLORIDE 0.9 % IV SOLN
420.0000 mg | Freq: Once | INTRAVENOUS | Status: AC
  Administered 2020-02-19: 420 mg via INTRAVENOUS
  Filled 2020-02-19: qty 14

## 2020-02-19 NOTE — Patient Instructions (Signed)
Carboplatin injection What is this medicine? CARBOPLATIN (KAR boe pla tin) is a chemotherapy drug. It targets fast dividing cells, like cancer cells, and causes these cells to die. This medicine is used to treat ovarian cancer and many other cancers. This medicine may be used for other purposes; ask your health care provider or pharmacist if you have questions. COMMON BRAND NAME(S): Paraplatin What should I tell my health care provider before I take this medicine? They need to know if you have any of these conditions:  blood disorders  hearing problems  kidney disease  recent or ongoing radiation therapy  an unusual or allergic reaction to carboplatin, cisplatin, other chemotherapy, other medicines, foods, dyes, or preservatives  pregnant or trying to get pregnant  breast-feeding How should I use this medicine? This drug is usually given as an infusion into a vein. It is administered in a hospital or clinic by a specially trained health care professional. Talk to your pediatrician regarding the use of this medicine in children. Special care may be needed. Overdosage: If you think you have taken too much of this medicine contact a poison control center or emergency room at once. NOTE: This medicine is only for you. Do not share this medicine with others. What if I miss a dose? It is important not to miss a dose. Call your doctor or health care professional if you are unable to keep an appointment. What may interact with this medicine?  medicines for seizures  medicines to increase blood counts like filgrastim, pegfilgrastim, sargramostim  some antibiotics like amikacin, gentamicin, neomycin, streptomycin, tobramycin  vaccines Talk to your doctor or health care professional before taking any of these medicines:  acetaminophen  aspirin  ibuprofen  ketoprofen  naproxen This list may not describe all possible interactions. Give your health care provider a list of all the  medicines, herbs, non-prescription drugs, or dietary supplements you use. Also tell them if you smoke, drink alcohol, or use illegal drugs. Some items may interact with your medicine. What should I watch for while using this medicine? Your condition will be monitored carefully while you are receiving this medicine. You will need important blood work done while you are taking this medicine. This drug may make you feel generally unwell. This is not uncommon, as chemotherapy can affect healthy cells as well as cancer cells. Report any side effects. Continue your course of treatment even though you feel ill unless your doctor tells you to stop. In some cases, you may be given additional medicines to help with side effects. Follow all directions for their use. Call your doctor or health care professional for advice if you get a fever, chills or sore throat, or other symptoms of a cold or flu. Do not treat yourself. This drug decreases your body's ability to fight infections. Try to avoid being around people who are sick. This medicine may increase your risk to bruise or bleed. Call your doctor or health care professional if you notice any unusual bleeding. Be careful brushing and flossing your teeth or using a toothpick because you may get an infection or bleed more easily. If you have any dental work done, tell your dentist you are receiving this medicine. Avoid taking products that contain aspirin, acetaminophen, ibuprofen, naproxen, or ketoprofen unless instructed by your doctor. These medicines may hide a fever. Do not become pregnant while taking this medicine. Women should inform their doctor if they wish to become pregnant or think they might be pregnant. There is a  potential for serious side effects to an unborn child. Talk to your health care professional or pharmacist for more information. Do not breast-feed an infant while taking this medicine. What side effects may I notice from receiving this  medicine? Side effects that you should report to your doctor or health care professional as soon as possible:  allergic reactions like skin rash, itching or hives, swelling of the face, lips, or tongue  signs of infection - fever or chills, cough, sore throat, pain or difficulty passing urine  signs of decreased platelets or bleeding - bruising, pinpoint red spots on the skin, black, tarry stools, nosebleeds  signs of decreased red blood cells - unusually weak or tired, fainting spells, lightheadedness  breathing problems  changes in hearing  changes in vision  chest pain  high blood pressure  low blood counts - This drug may decrease the number of white blood cells, red blood cells and platelets. You may be at increased risk for infections and bleeding.  nausea and vomiting  pain, swelling, redness or irritation at the injection site  pain, tingling, numbness in the hands or feet  problems with balance, talking, walking  trouble passing urine or change in the amount of urine Side effects that usually do not require medical attention (report to your doctor or health care professional if they continue or are bothersome):  hair loss  loss of appetite  metallic taste in the mouth or changes in taste This list may not describe all possible side effects. Call your doctor for medical advice about side effects. You may report side effects to FDA at 1-800-FDA-1088. Where should I keep my medicine? This drug is given in a hospital or clinic and will not be stored at home. NOTE: This sheet is a summary. It may not cover all possible information. If you have questions about this medicine, talk to your doctor, pharmacist, or health care provider.  2021 Elsevier/Gold Standard (2007-04-24 14:38:05) Docetaxel injection What is this medicine? DOCETAXEL (doe se TAX el) is a chemotherapy drug. It targets fast dividing cells, like cancer cells, and causes these cells to die. This medicine  is used to treat many types of cancers like breast cancer, certain stomach cancers, head and neck cancer, lung cancer, and prostate cancer. This medicine may be used for other purposes; ask your health care provider or pharmacist if you have questions. COMMON BRAND NAME(S): Docefrez, Taxotere What should I tell my health care provider before I take this medicine? They need to know if you have any of these conditions:  infection (especially a virus infection such as chickenpox, cold sores, or herpes)  liver disease  low blood counts, like low white cell, platelet, or red cell counts  an unusual or allergic reaction to docetaxel, polysorbate 80, other chemotherapy agents, other medicines, foods, dyes, or preservatives  pregnant or trying to get pregnant  breast-feeding How should I use this medicine? This drug is given as an infusion into a vein. It is administered in a hospital or clinic by a specially trained health care professional. Talk to your pediatrician regarding the use of this medicine in children. Special care may be needed. Overdosage: If you think you have taken too much of this medicine contact a poison control center or emergency room at once. NOTE: This medicine is only for you. Do not share this medicine with others. What if I miss a dose? It is important not to miss your dose. Call your doctor or health care professional  if you are unable to keep an appointment. What may interact with this medicine? Do not take this medicine with any of the following medications:  live virus vaccines This medicine may also interact with the following medications:  aprepitant  certain antibiotics like erythromycin or clarithromycin  certain antivirals for HIV or hepatitis  certain medicines for fungal infections like fluconazole, itraconazole, ketoconazole, posaconazole, or  voriconazole  cimetidine  ciprofloxacin  conivaptan  cyclosporine  dronedarone  fluvoxamine  grapefruit juice  imatinib  verapamil This list may not describe all possible interactions. Give your health care provider a list of all the medicines, herbs, non-prescription drugs, or dietary supplements you use. Also tell them if you smoke, drink alcohol, or use illegal drugs. Some items may interact with your medicine. What should I watch for while using this medicine? Your condition will be monitored carefully while you are receiving this medicine. You will need important blood work done while you are taking this medicine. Call your doctor or health care professional for advice if you get a fever, chills or sore throat, or other symptoms of a cold or flu. Do not treat yourself. This drug decreases your body's ability to fight infections. Try to avoid being around people who are sick. Some products may contain alcohol. Ask your health care professional if this medicine contains alcohol. Be sure to tell all health care professionals you are taking this medicine. Certain medicines, like metronidazole and disulfiram, can cause an unpleasant reaction when taken with alcohol. The reaction includes flushing, headache, nausea, vomiting, sweating, and increased thirst. The reaction can last from 30 minutes to several hours. You may get drowsy or dizzy. Do not drive, use machinery, or do anything that needs mental alertness until you know how this medicine affects you. Do not stand or sit up quickly, especially if you are an older patient. This reduces the risk of dizzy or fainting spells. Alcohol may interfere with the effect of this medicine. Talk to your health care professional about your risk of cancer. You may be more at risk for certain types of cancer if you take this medicine. Do not become pregnant while taking this medicine or for 6 months after stopping it. Women should inform their doctor if  they wish to become pregnant or think they might be pregnant. There is a potential for serious side effects to an unborn child. Talk to your health care professional or pharmacist for more information. Do not breast-feed an infant while taking this medicine or for 1 week after stopping it. Males who get this medicine must use a condom during sex with females who can get pregnant. If you get a woman pregnant, the baby could have birth defects. The baby could die before they are born. You will need to continue wearing a condom for 3 months after stopping the medicine. Tell your health care provider right away if your partner becomes pregnant while you are taking this medicine. This may interfere with the ability to father a child. You should talk to your doctor or health care professional if you are concerned about your fertility. What side effects may I notice from receiving this medicine? Side effects that you should report to your doctor or health care professional as soon as possible:  allergic reactions like skin rash, itching or hives, swelling of the face, lips, or tongue  blurred vision  breathing problems  changes in vision  low blood counts - This drug may decrease the number of white blood  cells, red blood cells and platelets. You may be at increased risk for infections and bleeding.  nausea and vomiting  pain, redness or irritation at site where injected  pain, tingling, numbness in the hands or feet  redness, blistering, peeling, or loosening of the skin, including inside the mouth  signs of decreased platelets or bleeding - bruising, pinpoint red spots on the skin, black, tarry stools, nosebleeds  signs of decreased red blood cells - unusually weak or tired, fainting spells, lightheadedness  signs of infection - fever or chills, cough, sore throat, pain or difficulty passing urine  swelling of the ankle, feet, hands Side effects that usually do not require medical attention  (report to your doctor or health care professional if they continue or are bothersome):  constipation  diarrhea  fingernail or toenail changes  hair loss  loss of appetite  mouth sores  muscle pain This list may not describe all possible side effects. Call your doctor for medical advice about side effects. You may report side effects to FDA at 1-800-FDA-1088. Where should I keep my medicine? This drug is given in a hospital or clinic and will not be stored at home. NOTE: This sheet is a summary. It may not cover all possible information. If you have questions about this medicine, talk to your doctor, pharmacist, or health care provider.  2021 Elsevier/Gold Standard (2018-12-17 19:50:31) Pertuzumab injection What is this medicine? PERTUZUMAB (per TOOZ ue mab) is a monoclonal antibody. It is used to treat breast cancer. This medicine may be used for other purposes; ask your health care provider or pharmacist if you have questions. COMMON BRAND NAME(S): PERJETA What should I tell my health care provider before I take this medicine? They need to know if you have any of these conditions:  heart disease  heart failure  high blood pressure  history of irregular heart beat  recent or ongoing radiation therapy  an unusual or allergic reaction to pertuzumab, other medicines, foods, dyes, or preservatives  pregnant or trying to get pregnant  breast-feeding How should I use this medicine? This medicine is for infusion into a vein. It is given by a health care professional in a hospital or clinic setting. Talk to your pediatrician regarding the use of this medicine in children. Special care may be needed. Overdosage: If you think you have taken too much of this medicine contact a poison control center or emergency room at once. NOTE: This medicine is only for you. Do not share this medicine with others. What if I miss a dose? It is important not to miss your dose. Call your  doctor or health care professional if you are unable to keep an appointment. What may interact with this medicine? Interactions are not expected. Give your health care provider a list of all the medicines, herbs, non-prescription drugs, or dietary supplements you use. Also tell them if you smoke, drink alcohol, or use illegal drugs. Some items may interact with your medicine. This list may not describe all possible interactions. Give your health care provider a list of all the medicines, herbs, non-prescription drugs, or dietary supplements you use. Also tell them if you smoke, drink alcohol, or use illegal drugs. Some items may interact with your medicine. What should I watch for while using this medicine? Your condition will be monitored carefully while you are receiving this medicine. Report any side effects. Continue your course of treatment even though you feel ill unless your doctor tells you to stop. Do   not become pregnant while taking this medicine or for 7 months after stopping it. Women should inform their doctor if they wish to become pregnant or think they might be pregnant. Women of child-bearing potential will need to have a negative pregnancy test before starting this medicine. There is a potential for serious side effects to an unborn child. Talk to your health care professional or pharmacist for more information. Do not breast-feed an infant while taking this medicine or for 7 months after stopping it. Women must use effective birth control with this medicine. Call your doctor or health care professional for advice if you get a fever, chills or sore throat, or other symptoms of a cold or flu. Do not treat yourself. Try to avoid being around people who are sick. You may experience fever, chills, and headache during the infusion. Report any side effects during the infusion to your health care professional. What side effects may I notice from receiving this medicine? Side effects that you  should report to your doctor or health care professional as soon as possible:  breathing problems  chest pain or palpitations  dizziness  feeling faint or lightheaded  fever or chills  skin rash, itching or hives  sore throat  swelling of the face, lips, or tongue  swelling of the legs or ankles  unusually weak or tired Side effects that usually do not require medical attention (report to your doctor or health care professional if they continue or are bothersome):  diarrhea  hair loss  nausea, vomiting  tiredness This list may not describe all possible side effects. Call your doctor for medical advice about side effects. You may report side effects to FDA at 1-800-FDA-1088. Where should I keep my medicine? This drug is given in a hospital or clinic and will not be stored at home. NOTE: This sheet is a summary. It may not cover all possible information. If you have questions about this medicine, talk to your doctor, pharmacist, or health care provider.  2021 Elsevier/Gold Standard (2015-02-19 12:08:50) Trastuzumab injection for infusion What is this medicine? TRASTUZUMAB (tras TOO zoo mab) is a monoclonal antibody. It is used to treat breast cancer and stomach cancer. This medicine may be used for other purposes; ask your health care provider or pharmacist if you have questions. COMMON BRAND NAME(S): Herceptin, Herzuma, KANJINTI, Ogivri, Ontruzant, Trazimera What should I tell my health care provider before I take this medicine? They need to know if you have any of these conditions:  heart disease  heart failure  lung or breathing disease, like asthma  an unusual or allergic reaction to trastuzumab, benzyl alcohol, or other medications, foods, dyes, or preservatives  pregnant or trying to get pregnant  breast-feeding How should I use this medicine? This drug is given as an infusion into a vein. It is administered in a hospital or clinic by a specially trained  health care professional. Talk to your pediatrician regarding the use of this medicine in children. This medicine is not approved for use in children. Overdosage: If you think you have taken too much of this medicine contact a poison control center or emergency room at once. NOTE: This medicine is only for you. Do not share this medicine with others. What if I miss a dose? It is important not to miss a dose. Call your doctor or health care professional if you are unable to keep an appointment. What may interact with this medicine? This medicine may interact with the following medications:    certain types of chemotherapy, such as daunorubicin, doxorubicin, epirubicin, and idarubicin This list may not describe all possible interactions. Give your health care provider a list of all the medicines, herbs, non-prescription drugs, or dietary supplements you use. Also tell them if you smoke, drink alcohol, or use illegal drugs. Some items may interact with your medicine. What should I watch for while using this medicine? Visit your doctor for checks on your progress. Report any side effects. Continue your course of treatment even though you feel ill unless your doctor tells you to stop. Call your doctor or health care professional for advice if you get a fever, chills or sore throat, or other symptoms of a cold or flu. Do not treat yourself. Try to avoid being around people who are sick. You may experience fever, chills and shaking during your first infusion. These effects are usually mild and can be treated with other medicines. Report any side effects during the infusion to your health care professional. Fever and chills usually do not happen with later infusions. Do not become pregnant while taking this medicine or for 7 months after stopping it. Women should inform their doctor if they wish to become pregnant or think they might be pregnant. Women of child-bearing potential will need to have a negative  pregnancy test before starting this medicine. There is a potential for serious side effects to an unborn child. Talk to your health care professional or pharmacist for more information. Do not breast-feed an infant while taking this medicine or for 7 months after stopping it. Women must use effective birth control with this medicine. What side effects may I notice from receiving this medicine? Side effects that you should report to your doctor or health care professional as soon as possible:  allergic reactions like skin rash, itching or hives, swelling of the face, lips, or tongue  chest pain or palpitations  cough  dizziness  feeling faint or lightheaded, falls  fever  general ill feeling or flu-like symptoms  signs of worsening heart failure like breathing problems; swelling in your legs and feet  unusually weak or tired Side effects that usually do not require medical attention (report to your doctor or health care professional if they continue or are bothersome):  bone pain  changes in taste  diarrhea  joint pain  nausea/vomiting  weight loss This list may not describe all possible side effects. Call your doctor for medical advice about side effects. You may report side effects to FDA at 1-800-FDA-1088. Where should I keep my medicine? This drug is given in a hospital or clinic and will not be stored at home. NOTE: This sheet is a summary. It may not cover all possible information. If you have questions about this medicine, talk to your doctor, pharmacist, or health care provider.  2021 Elsevier/Gold Standard (2016-01-12 14:37:52)  

## 2020-02-20 ENCOUNTER — Inpatient Hospital Stay: Payer: PRIVATE HEALTH INSURANCE

## 2020-02-20 VITALS — BP 166/84 | HR 108 | Temp 97.7°F | Resp 18 | Ht 69.0 in | Wt 156.8 lb

## 2020-02-20 DIAGNOSIS — Z5111 Encounter for antineoplastic chemotherapy: Secondary | ICD-10-CM | POA: Diagnosis not present

## 2020-02-20 DIAGNOSIS — Z171 Estrogen receptor negative status [ER-]: Secondary | ICD-10-CM

## 2020-02-20 MED ORDER — PEGFILGRASTIM-JMDB 6 MG/0.6ML ~~LOC~~ SOSY
PREFILLED_SYRINGE | SUBCUTANEOUS | Status: AC
  Filled 2020-02-20: qty 0.6

## 2020-02-20 MED ORDER — PEGFILGRASTIM-JMDB 6 MG/0.6ML ~~LOC~~ SOSY
6.0000 mg | PREFILLED_SYRINGE | Freq: Once | SUBCUTANEOUS | Status: AC
  Administered 2020-02-20: 6 mg via SUBCUTANEOUS

## 2020-02-20 NOTE — Patient Instructions (Signed)
Pegfilgrastim injection What is this medicine? PEGFILGRASTIM (PEG fil gra stim) is a long-acting granulocyte colony-stimulating factor that stimulates the growth of neutrophils, a type of white blood cell important in the body's fight against infection. It is used to reduce the incidence of fever and infection in patients with certain types of cancer who are receiving chemotherapy that affects the bone marrow, and to increase survival after being exposed to high doses of radiation. This medicine may be used for other purposes; ask your health care provider or pharmacist if you have questions. COMMON BRAND NAME(S): Fulphila, Neulasta, Nyvepria, UDENYCA, Ziextenzo What should I tell my health care provider before I take this medicine? They need to know if you have any of these conditions:  kidney disease  latex allergy  ongoing radiation therapy  sickle cell disease  skin reactions to acrylic adhesives (On-Body Injector only)  an unusual or allergic reaction to pegfilgrastim, filgrastim, other medicines, foods, dyes, or preservatives  pregnant or trying to get pregnant  breast-feeding How should I use this medicine? This medicine is for injection under the skin. If you get this medicine at home, you will be taught how to prepare and give the pre-filled syringe or how to use the On-body Injector. Refer to the patient Instructions for Use for detailed instructions. Use exactly as directed. Tell your healthcare provider immediately if you suspect that the On-body Injector may not have performed as intended or if you suspect the use of the On-body Injector resulted in a missed or partial dose. It is important that you put your used needles and syringes in a special sharps container. Do not put them in a trash can. If you do not have a sharps container, call your pharmacist or healthcare provider to get one. Talk to your pediatrician regarding the use of this medicine in children. While this drug  may be prescribed for selected conditions, precautions do apply. Overdosage: If you think you have taken too much of this medicine contact a poison control center or emergency room at once. NOTE: This medicine is only for you. Do not share this medicine with others. What if I miss a dose? It is important not to miss your dose. Call your doctor or health care professional if you miss your dose. If you miss a dose due to an On-body Injector failure or leakage, a new dose should be administered as soon as possible using a single prefilled syringe for manual use. What may interact with this medicine? Interactions have not been studied. This list may not describe all possible interactions. Give your health care provider a list of all the medicines, herbs, non-prescription drugs, or dietary supplements you use. Also tell them if you smoke, drink alcohol, or use illegal drugs. Some items may interact with your medicine. What should I watch for while using this medicine? Your condition will be monitored carefully while you are receiving this medicine. You may need blood work done while you are taking this medicine. Talk to your health care provider about your risk of cancer. You may be more at risk for certain types of cancer if you take this medicine. If you are going to need a MRI, CT scan, or other procedure, tell your doctor that you are using this medicine (On-Body Injector only). What side effects may I notice from receiving this medicine? Side effects that you should report to your doctor or health care professional as soon as possible:  allergic reactions (skin rash, itching or hives, swelling of   the face, lips, or tongue)  back pain  dizziness  fever  pain, redness, or irritation at site where injected  pinpoint red spots on the skin  red or dark-brown urine  shortness of breath or breathing problems  stomach or side pain, or pain at the shoulder  swelling  tiredness  trouble  passing urine or change in the amount of urine  unusual bruising or bleeding Side effects that usually do not require medical attention (report to your doctor or health care professional if they continue or are bothersome):  bone pain  muscle pain This list may not describe all possible side effects. Call your doctor for medical advice about side effects. You may report side effects to FDA at 1-800-FDA-1088. Where should I keep my medicine? Keep out of the reach of children. If you are using this medicine at home, you will be instructed on how to store it. Throw away any unused medicine after the expiration date on the label. NOTE: This sheet is a summary. It may not cover all possible information. If you have questions about this medicine, talk to your doctor, pharmacist, or health care provider.  2021 Elsevier/Gold Standard (2019-02-08 13:20:51)  

## 2020-02-21 ENCOUNTER — Inpatient Hospital Stay: Payer: PRIVATE HEALTH INSURANCE

## 2020-03-03 ENCOUNTER — Other Ambulatory Visit: Payer: Self-pay

## 2020-03-03 ENCOUNTER — Other Ambulatory Visit: Payer: Self-pay | Admitting: Internal Medicine

## 2020-03-03 ENCOUNTER — Ambulatory Visit: Payer: PRIVATE HEALTH INSURANCE | Attending: Internal Medicine

## 2020-03-03 DIAGNOSIS — Z23 Encounter for immunization: Secondary | ICD-10-CM

## 2020-03-03 NOTE — Progress Notes (Signed)
   Covid-19 Vaccination Clinic  Name:  RHEANNA SERGENT    MRN: [id number] DOB: 1960/11/10  03/03/2020  Ms. Crusoe was observed post Covid-19 immunization for 15 minutes without incident. She was provided with Vaccine Information Sheet and instruction to access the V-Safe system.   Ms. Gregg was instructed to call 911 with any severe reactions post vaccine: Marland Kitchen Difficulty breathing  . Swelling of face and throat  . A fast heartbeat  . A bad rash all over body  . Dizziness and weakness   Immunizations Administered    Name Date Dose VIS Date Route   Moderna Covid-19 Booster Vaccine 03/03/2020 11:42 AM 0.25 mL 11/20/2019 Intramuscular   Manufacturer: Moderna   Lot: [id number]   Rachel: [id number]

## 2020-03-04 ENCOUNTER — Other Ambulatory Visit: Payer: Self-pay | Admitting: Oncology

## 2020-03-04 DIAGNOSIS — C50512 Malignant neoplasm of lower-outer quadrant of left female breast: Secondary | ICD-10-CM

## 2020-03-04 DIAGNOSIS — Z171 Estrogen receptor negative status [ER-]: Secondary | ICD-10-CM

## 2020-03-04 NOTE — Progress Notes (Incomplete)
[facility]  [address] 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   HISTORY OF PRESENT ILLNESS:  Brittney Burgess is a 60 y.o. 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  Screening mammogram in August which revealed a mass with calcifications, a second asymmetry and possible dilated ducts within the left breast.  Right breast was negative.  Diagnostic unilateral left mammogram and left breast ultrasound confirmed a 1.6 cm elongated mass containing pleomorphic calcifications at 7 o'clock, a 5 mm hypoechoic indeterminate mass containing 2 benign-appearing calcification at 2 o'clock, and several retroareolar ecstatic ducts containing internal debris or possible tissue.  Ultrasound guided biopsy on September 20th of the mass at 5 o'clock revealed grade 3, invasive ductal carcinoma, grade 3.  Estrogen and progesterone receptors were negative with HER2 positive.  Ki67 was 50%. 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  The mass at 2 o'clock was consistent with fibrocystic changes, including sclerosing adenosis with calcifications.  No atypia, in situ or invasive malignancy.   Biopsy at 11:30 o'clock revealed a fragmented intraductal papilloma and dilated duct.  No atypia, in situ or invasive malignancy identified.  She underwent lumpectomy with sentinel lymph node biopsy on November 22nd.  Surgical pathology from this procedure revealed a 1.2 cm, grade 3, invasive ductal carcinoma with high grade ductal carcinoma in situ.  One sentinel lymph node was negative for malignancy.  Margins were negative.  She also had  placement of a port at that time.  She had a hysterectomy and bilateral salpingo oophorectomy in 2004 due to menorrhagia.  She is unsure if she was given hormone replacement.  She has baseline neuropathy of her feet secondary to diabetes.  She started adjuvant chemotherapy/HER2 targeted therapy with docetaxel/carboplatin/trastuzumab/pertuzumab [facility]) on December 29th.   INTERVAL HISTORY:  Brittney Burgess is here for prior to her 2nd cycle of TCHP.  She states she has mild fatigue, but otherwise tolerated her 1st cycle without significant difficulty.  She denies fevers or chills.  She denies pain.  Her appetite is good. Her weight has increased 1.5 pounds over last 3 weeks.   Brittney Burgess is here for routine follow up prior to a 3rd cycle of TCHP.   Her  appetite is good, and she has gained/lost _ pounds since her last visit.  She denies fever, chills or other signs of infection.  She denies nausea, vomiting, bowel issues, or abdominal pain.  She denies sore throat, cough, dyspnea, or chest pain.  REVIEW OF SYSTEMS:  Review of Systems - Oncology   VITALS:  There were no vitals taken for this visit.  Wt Readings from Last 3 Encounters:  02/20/20 156 lb 12 oz (71.1 kg)  02/19/20 164 lb 12 oz (74.7 kg)  02/14/20 166 lb 8 oz (75.5 kg)    There is no height or weight on file to calculate BMI.  Performance status (ECOG): 1 - Symptomatic but completely ambulatory  PHYSICAL EXAM:  Physical Exam  LABS:   CBC Latest Ref Rng & Units 02/14/2020 01/28/2020 11/11/2019  WBC - 11.4 9.6 12.3  Hemoglobin 12.0 - 16.0 13.2 13.0  12.5  Hematocrit 36 - 46 39 39 37  Platelets 150 - 399 163 250 291   CMP Latest Ref Rng & Units 02/14/2020 01/28/2020 01/28/2020  BUN 4 - '21 11 14 ' -  Creatinine 0.5 - 1.1 0.8 0.9 0.8  Sodium 137 - 147 141 140 -  Potassium 3.4 - 5.3 3.7 3.8 -  Chloride 99 - 108 108 107 -  CO2 13 - 22 27(A) 25(A) -  Calcium 8.7 - 10.7 9.9 - -  Alkaline Phos 25 - 125 118 - -  AST 13 - 35 20 - -  ALT 7 - 35  14 - -    STUDIES:    Allergies:  Allergies  Allergen Reactions  . Metformin And Related Other (See Comments)    Twitching, nausea    Current Medications: Current Outpatient Medications  Medication Sig Dispense Refill  . amLODipine (NORVASC) 5 MG tablet Take by mouth.    . dexamethasone (DECADRON) 4 MG tablet Take 2 tabs twice daily the day before chemotherapy and for 2 days after chemotherapy 60 tablet 0  . hydrochlorothiazide (HYDRODIURIL) 25 MG tablet Take by mouth.    . insulin glargine (LANTUS) 100 UNIT/ML Solostar Pen Inject into the skin. 36-41 units    . losartan (COZAAR) 50 MG tablet Take by mouth.    . ondansetron (ZOFRAN) 4 MG tablet Take 1 tablet (4 mg total) by mouth every 4 (four) hours as needed for nausea. 90 tablet 3  . prochlorperazine (COMPAZINE) 10 MG tablet Take 1 tablet (10 mg total) by mouth every 6 (six) hours as needed for nausea or vomiting. 90 tablet 3   No current facility-administered medications for this visit.     ASSESSMENT & PLAN:   Assessment:   1. 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   She is receiving adjuvant TCHP IV every 3 weeks for 6 cycles, with plans to continue with maintenance Herceptin/Perjeta for a total of 1 year.  2. Hypokalemia, resolved with increasing potassium in her diet.  3. Neuropathy of bilateral feet.  This is currently well controlled.  4. Tobacco abuse.  We discussed the importance of smoking cessation.  She has a prescription for Nicorette gum.  5. Mild leukocytosis most likely secondary to Neulasta.  The patient does not have any evidence of infection.  Plan:   She will proceed with a 3rd cycle of TCHP on February 9th.  We will plan to see her back in 3 weeks with a CBC and comprehensive metabolic panel prior to a 4th cycle.  The patient understands the plans discussed today and is in agreement with them.  She knows to contact our office if she develops  concerns prior to her next appointment.    Derwood Kaplan, MD [facility] AT [facility] [address] [place] [address] Dept: [phone] Dept Fax: [phone]    I, Rita Ohara, am acting as scribe for Derwood Kaplan, MD  I have reviewed this report as typed by the medical scribe, and it is complete and accurate.

## 2020-03-05 NOTE — Progress Notes (Signed)
[facility]  [address] 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  Current Treatment:  TCHP IV every 3 weeks for 6 doses, followed by maintenance Herceptin/Perjeta for a total of 1 year of therapy.   HISTORY OF PRESENT ILLNESS:  Brittney Burgess is a 60 y.o. 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  Screening mammogram in August which revealed a mass with calcifications, a second asymmetry and possible dilated ducts within the left breast.  Right breast was negative.  Diagnostic unilateral left mammogram and left breast ultrasound confirmed a 1.6 cm elongated mass containing pleomorphic calcifications at 7 o'clock, a 5 mm hypoechoic indeterminate mass containing 2 benign-appearing calcification at 2 o'clock, and several retroareolar ecstatic ducts containing internal debris or possible tissue.  Ultrasound guided biopsy on September 20th of the mass at 5 o'clock revealed grade 3, invasive ductal carcinoma, grade 3.  Estrogen and progesterone receptors were negative with HER2 positive.  Ki67 was 50%. 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  The mass at 2 o'clock was consistent with fibrocystic changes, including sclerosing adenosis with calcifications.  No atypia, in situ or invasive malignancy.   Biopsy at 11:30 o'clock revealed a fragmented intraductal papilloma and dilated duct.  No atypia, in situ or invasive malignancy identified.  She underwent lumpectomy with sentinel lymph node biopsy on November 22nd.  Surgical pathology from this procedure revealed a 1.2 cm, grade 3, invasive ductal carcinoma with  high grade ductal carcinoma in situ.  One sentinel lymph node was negative for malignancy.  Margins were negative.  She also had placement of a port at that time.  She had a hysterectomy and bilateral salpingo oophorectomy in 2004 due to menorrhagia.  She is unsure if she was given hormone replacement.  She has baseline neuropathy of her feet secondary to diabetes.  She started adjuvant chemotherapy/HER2 targeted therapy with docetaxel/carboplatin/trastuzumab/pertuzumab [facility]) on December 29th.   INTERVAL HISTORY:  Brittney Burgess is here for routine follow up prior to a 3rd cycle of TCHP. She has been tolerating treatment well. Today she complains of increased fatigue, however, she attributes this to her Moderna Covid vaccine that she received 2 days ago. She denies fever, chills, nausea or vomiting. She denies issue with bowel or bladder. She denies shortness of breath, cough or chest pain. Her appetite is good and she has lost 3 pounds since her last visit. She has some neuropathy, but this is no worse and is tolerable. CBC and CMP today are unremarkable.   REVIEW OF SYSTEMS:  Review of Systems  Constitutional: Positive for fatigue. Negative for appetite change, chills, diaphoresis, fever and unexpected weight change.  HENT:   Negative for hearing loss, lump/mass, mouth sores, nosebleeds, sore throat, tinnitus, trouble swallowing and voice change.   Eyes: Negative for eye problems and icterus.  Respiratory: Negative for chest tightness, cough, hemoptysis, shortness of breath and wheezing.   Cardiovascular: Negative for chest pain, leg swelling and palpitations.  Gastrointestinal: Negative for abdominal distention, abdominal pain, blood in stool, constipation, diarrhea, nausea, rectal pain and vomiting.  Endocrine: Negative for hot flashes.  Genitourinary: Negative for bladder  incontinence, difficulty urinating, dyspareunia, dysuria, frequency, hematuria and nocturia.   Musculoskeletal: Negative for  arthralgias, back pain, flank pain, gait problem, myalgias, neck pain and neck stiffness.  Skin: Negative for itching, rash and wound.  Neurological: Negative for dizziness, extremity weakness, gait problem, headaches, light-headedness, numbness, seizures and speech difficulty.  Hematological: Negative for adenopathy. Does not bruise/bleed easily.  Psychiatric/Behavioral: Negative for confusion, decreased concentration, depression, sleep disturbance and suicidal ideas. The patient is not nervous/anxious.      VITALS:  Blood pressure (!) 166/70, pulse (!) 115, temperature 98.1 F (36.7 C), temperature source Oral, resp. rate 18, height '5\' 9"'  (1.753 m), weight 163 lb 4.8 oz (74.1 kg), SpO2 97 %.  Wt Readings from Last 3 Encounters:  03/06/20 163 lb 4.8 oz (74.1 kg)  02/20/20 156 lb 12 oz (71.1 kg)  02/19/20 164 lb 12 oz (74.7 kg)    Body mass index is 24.12 kg/m.  Performance status (ECOG): 1 - Symptomatic but completely ambulatory  PHYSICAL EXAM:  Physical Exam Constitutional:      General: She is not in acute distress.    Appearance: Normal appearance. She is normal weight. She is not ill-appearing, toxic-appearing or diaphoretic.  HENT:     Head: Normocephalic and atraumatic.     Right Ear: Tympanic membrane normal.     Left Ear: Tympanic membrane normal.     Nose: Nose normal. No congestion or rhinorrhea.     Mouth/Throat:     Mouth: Mucous membranes are moist.     Pharynx: Oropharynx is clear. No oropharyngeal exudate or posterior oropharyngeal erythema.  Eyes:     General: No scleral icterus.       Right eye: No discharge.        Left eye: No discharge.     Extraocular Movements: Extraocular movements intact.     Conjunctiva/sclera: Conjunctivae normal.     Pupils: Pupils are equal, round, and reactive to light.  Neck:     Vascular: No carotid bruit.  Cardiovascular:     Rate and Rhythm: Normal rate and regular rhythm.     Heart sounds: No murmur heard. No friction  rub. No gallop.   Pulmonary:     Effort: Pulmonary effort is normal. No respiratory distress.     Breath sounds: Normal breath sounds. No stridor. No wheezing, rhonchi or rales.  Chest:     Chest wall: No tenderness.  Abdominal:     General: Abdomen is flat. Bowel sounds are normal. There is no distension.     Palpations: There is no mass.     Tenderness: There is no abdominal tenderness. There is no right CVA tenderness, left CVA tenderness, guarding or rebound.     Hernia: No hernia is present.  Musculoskeletal:        General: No swelling, tenderness, deformity or signs of injury. Normal range of motion.     Cervical back: Normal range of motion and neck supple. No rigidity or tenderness.     Right lower leg: No edema.     Left lower leg: No edema.  Lymphadenopathy:     Cervical: No cervical adenopathy.  Skin:    General: Skin is warm and dry.     Capillary Refill: Capillary refill takes less than 2 seconds.     Coloration: Skin is not jaundiced or pale.     Findings: No bruising, erythema, lesion or rash.  Neurological:     General: No focal deficit present.     Mental  Status: She is alert and oriented to person, place, and time. Mental status is at baseline.     Cranial Nerves: No cranial nerve deficit.     Sensory: No sensory deficit.     Motor: No weakness.     Coordination: Coordination normal.     Gait: Gait normal.     Deep Tendon Reflexes: Reflexes normal.  Psychiatric:        Mood and Affect: Mood normal.        Behavior: Behavior normal.        Thought Content: Thought content normal.        Judgment: Judgment normal.     LABS:   CBC Latest Ref Rng & Units 03/06/2020 02/14/2020 01/28/2020  WBC - 10.2 11.4 9.6  Hemoglobin 12.0 - 16.0 11.4(A) 13.2 13.0  Hematocrit 36 - 46 34(A) 39 39  Platelets 150 - 399 206 163 250   CMP Latest Ref Rng & Units 03/06/2020 02/14/2020 01/28/2020  BUN 4 - '21 10 11 14  ' Creatinine 0.5 - 1.1 0.7 0.8 0.9  Sodium 137 - 147 142 141 140   Potassium 3.4 - 5.3 4.0 3.7 3.8  Chloride 99 - 108 109(A) 108 107  CO2 13 - 22 28(A) 27(A) 25(A)  Calcium 8.7 - 10.7 9.3 9.9 -  Alkaline Phos 25 - 125 99 118 -  AST 13 - 35 20 20 -  ALT 7 - 35 12 14 -    STUDIES:    Allergies:  Allergies  Allergen Reactions  . Metformin And Related Other (See Comments)    Twitching, nausea    Current Medications: Current Outpatient Medications  Medication Sig Dispense Refill  . amLODipine (NORVASC) 5 MG tablet Take by mouth.    . dexamethasone (DECADRON) 4 MG tablet Take 2 tabs twice daily the day before chemotherapy and for 2 days after chemotherapy 60 tablet 0  . hydrochlorothiazide (HYDRODIURIL) 25 MG tablet Take by mouth.    . insulin glargine (LANTUS) 100 UNIT/ML Solostar Pen Inject into the skin. 36-41 units    . losartan (COZAAR) 50 MG tablet Take by mouth.    . ondansetron (ZOFRAN) 4 MG tablet Take 1 tablet (4 mg total) by mouth every 4 (four) hours as needed for nausea. 90 tablet 3  . prochlorperazine (COMPAZINE) 10 MG tablet Take 1 tablet (10 mg total) by mouth every 6 (six) hours as needed for nausea or vomiting. 90 tablet 3   No current facility-administered medications for this visit.     ASSESSMENT & PLAN:   Assessment:   1. 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   She is receiving adjuvant TCHP IV every 3 weeks for 6 cycles, with plans to continue with maintenance Herceptin/Perjeta for a total of 1 year  2. Neuropathy of bilateral feet.  This is currently well controlled.  3. Tobacco abuse.  We discussed the importance of smoking cessation.  She has a prescription for Nicorette gum.  Plan:   She will proceed with a 3rd cycle of TCHP on February 9th as her labs are within range for chemotherapy. She knows to call if her fatigue continues.  We will plan to see her back in 3 weeks with a CBC and comprehensive metabolic panel prior to a 4th cycle. She did not require  any refills today. The patient understands the plans discussed today and is in agreement with them.  She knows to contact our office if  she develops concerns prior to her next appointment.    [name], NP [facility] AT [facility] [address] [place] [address] Dept: [phone] Dept Fax: [phone]

## 2020-03-06 ENCOUNTER — Other Ambulatory Visit: Payer: Self-pay

## 2020-03-06 ENCOUNTER — Telehealth: Payer: Self-pay | Admitting: Hematology and Oncology

## 2020-03-06 ENCOUNTER — Inpatient Hospital Stay: Payer: PRIVATE HEALTH INSURANCE | Attending: Hematology and Oncology | Admitting: Hematology and Oncology

## 2020-03-06 ENCOUNTER — Encounter: Payer: Self-pay | Admitting: Hematology and Oncology

## 2020-03-06 ENCOUNTER — Inpatient Hospital Stay: Payer: PRIVATE HEALTH INSURANCE

## 2020-03-06 VITALS — BP 166/70 | HR 115 | Temp 98.1°F | Resp 18 | Ht 69.0 in | Wt 163.3 lb

## 2020-03-06 DIAGNOSIS — Z5112 Encounter for antineoplastic immunotherapy: Secondary | ICD-10-CM | POA: Insufficient documentation

## 2020-03-06 DIAGNOSIS — E1140 Type 2 diabetes mellitus with diabetic neuropathy, unspecified: Secondary | ICD-10-CM | POA: Insufficient documentation

## 2020-03-06 DIAGNOSIS — C50512 Malignant neoplasm of lower-outer quadrant of left female breast: Secondary | ICD-10-CM

## 2020-03-06 DIAGNOSIS — Z90722 Acquired absence of ovaries, bilateral: Secondary | ICD-10-CM | POA: Insufficient documentation

## 2020-03-06 DIAGNOSIS — Z5111 Encounter for antineoplastic chemotherapy: Secondary | ICD-10-CM | POA: Insufficient documentation

## 2020-03-06 DIAGNOSIS — C50212 Malignant neoplasm of upper-inner quadrant of left female breast: Secondary | ICD-10-CM | POA: Insufficient documentation

## 2020-03-06 DIAGNOSIS — Z5189 Encounter for other specified aftercare: Secondary | ICD-10-CM | POA: Insufficient documentation

## 2020-03-06 DIAGNOSIS — Z79899 Other long term (current) drug therapy: Secondary | ICD-10-CM | POA: Insufficient documentation

## 2020-03-06 DIAGNOSIS — Z171 Estrogen receptor negative status [ER-]: Secondary | ICD-10-CM

## 2020-03-06 DIAGNOSIS — Z170 Estrogen receptor positive status [ER+]: Secondary | ICD-10-CM | POA: Insufficient documentation

## 2020-03-06 DIAGNOSIS — Z90710 Acquired absence of both cervix and uterus: Secondary | ICD-10-CM | POA: Insufficient documentation

## 2020-03-06 DIAGNOSIS — Z9079 Acquired absence of other genital organ(s): Secondary | ICD-10-CM | POA: Insufficient documentation

## 2020-03-06 DIAGNOSIS — E876 Hypokalemia: Secondary | ICD-10-CM | POA: Insufficient documentation

## 2020-03-06 DIAGNOSIS — Z7952 Long term (current) use of systemic steroids: Secondary | ICD-10-CM | POA: Insufficient documentation

## 2020-03-06 DIAGNOSIS — Z794 Long term (current) use of insulin: Secondary | ICD-10-CM | POA: Insufficient documentation

## 2020-03-06 LAB — CBC AND DIFFERENTIAL
HCT: 34 — AB (ref 36–46)
Hemoglobin: 11.4 — AB (ref 12.0–16.0)
Neutrophils Absolute: 8.16
Platelets: 206 (ref 150–399)
WBC: 10.2

## 2020-03-06 LAB — HEPATIC FUNCTION PANEL
ALT: 12 (ref 7–35)
AST: 20 (ref 13–35)
Alkaline Phosphatase: 99 (ref 25–125)
Bilirubin, Total: 0.4

## 2020-03-06 LAB — MAGNESIUM: Magnesium: 2

## 2020-03-06 LAB — COMPREHENSIVE METABOLIC PANEL
Albumin: 3.8 (ref 3.5–5.0)
Calcium: 9.3 (ref 8.7–10.7)

## 2020-03-06 LAB — BASIC METABOLIC PANEL
BUN: 10 (ref 4–21)
CO2: 28 — AB (ref 13–22)
Chloride: 109 — AB (ref 99–108)
Creatinine: 0.7 (ref 0.5–1.1)
Glucose: 80
Potassium: 4 (ref 3.4–5.3)
Sodium: 142 (ref 137–147)

## 2020-03-06 LAB — CBC: RBC: 3.97 (ref 3.87–5.11)

## 2020-03-06 NOTE — Telephone Encounter (Signed)
Per 2/4 los next appt sched and given to patient 

## 2020-03-11 ENCOUNTER — Other Ambulatory Visit: Payer: Self-pay

## 2020-03-11 ENCOUNTER — Inpatient Hospital Stay: Payer: PRIVATE HEALTH INSURANCE

## 2020-03-11 VITALS — BP 176/82 | HR 106 | Temp 97.7°F | Resp 18

## 2020-03-11 DIAGNOSIS — Z5189 Encounter for other specified aftercare: Secondary | ICD-10-CM | POA: Diagnosis not present

## 2020-03-11 DIAGNOSIS — C50212 Malignant neoplasm of upper-inner quadrant of left female breast: Secondary | ICD-10-CM | POA: Diagnosis present

## 2020-03-11 DIAGNOSIS — E1140 Type 2 diabetes mellitus with diabetic neuropathy, unspecified: Secondary | ICD-10-CM | POA: Diagnosis not present

## 2020-03-11 DIAGNOSIS — Z170 Estrogen receptor positive status [ER+]: Secondary | ICD-10-CM | POA: Diagnosis not present

## 2020-03-11 DIAGNOSIS — Z794 Long term (current) use of insulin: Secondary | ICD-10-CM | POA: Diagnosis not present

## 2020-03-11 DIAGNOSIS — Z5111 Encounter for antineoplastic chemotherapy: Secondary | ICD-10-CM | POA: Diagnosis present

## 2020-03-11 DIAGNOSIS — Z90722 Acquired absence of ovaries, bilateral: Secondary | ICD-10-CM | POA: Diagnosis not present

## 2020-03-11 DIAGNOSIS — E876 Hypokalemia: Secondary | ICD-10-CM | POA: Diagnosis not present

## 2020-03-11 DIAGNOSIS — Z7952 Long term (current) use of systemic steroids: Secondary | ICD-10-CM | POA: Diagnosis not present

## 2020-03-11 DIAGNOSIS — Z5112 Encounter for antineoplastic immunotherapy: Secondary | ICD-10-CM | POA: Diagnosis present

## 2020-03-11 DIAGNOSIS — Z79899 Other long term (current) drug therapy: Secondary | ICD-10-CM | POA: Diagnosis not present

## 2020-03-11 DIAGNOSIS — Z9079 Acquired absence of other genital organ(s): Secondary | ICD-10-CM | POA: Diagnosis not present

## 2020-03-11 DIAGNOSIS — Z171 Estrogen receptor negative status [ER-]: Secondary | ICD-10-CM

## 2020-03-11 DIAGNOSIS — Z90710 Acquired absence of both cervix and uterus: Secondary | ICD-10-CM | POA: Diagnosis not present

## 2020-03-11 MED ORDER — SODIUM CHLORIDE 0.9 % IV SOLN
150.0000 mg | Freq: Once | INTRAVENOUS | Status: AC
  Administered 2020-03-11: 150 mg via INTRAVENOUS
  Filled 2020-03-11: qty 150

## 2020-03-11 MED ORDER — SODIUM CHLORIDE 0.9 % IV SOLN
Freq: Once | INTRAVENOUS | Status: AC
  Filled 2020-03-11: qty 250

## 2020-03-11 MED ORDER — SODIUM CHLORIDE 0.9 % IV SOLN
10.0000 mg | Freq: Once | INTRAVENOUS | Status: AC
  Administered 2020-03-11: 10 mg via INTRAVENOUS
  Filled 2020-03-11: qty 10

## 2020-03-11 MED ORDER — DIPHENHYDRAMINE HCL 50 MG/ML IJ SOLN
INTRAMUSCULAR | Status: AC
  Filled 2020-03-11: qty 1

## 2020-03-11 MED ORDER — SODIUM CHLORIDE 0.9 % IV SOLN
420.0000 mg | Freq: Once | INTRAVENOUS | Status: AC
  Administered 2020-03-11: 420 mg via INTRAVENOUS
  Filled 2020-03-11: qty 14

## 2020-03-11 MED ORDER — SODIUM CHLORIDE 0.9 % IV SOLN
75.0000 mg/m2 | Freq: Once | INTRAVENOUS | Status: AC
  Administered 2020-03-11: 150 mg via INTRAVENOUS
  Filled 2020-03-11: qty 15

## 2020-03-11 MED ORDER — DIPHENHYDRAMINE HCL 50 MG/ML IJ SOLN
25.0000 mg | Freq: Once | INTRAMUSCULAR | Status: AC
  Administered 2020-03-11: 25 mg via INTRAVENOUS

## 2020-03-11 MED ORDER — TRASTUZUMAB-DKST CHEMO 150 MG IV SOLR
6.0000 mg/kg | Freq: Once | INTRAVENOUS | Status: AC
  Administered 2020-03-11: 462 mg via INTRAVENOUS
  Filled 2020-03-11: qty 22

## 2020-03-11 MED ORDER — PALONOSETRON HCL INJECTION 0.25 MG/5ML
INTRAVENOUS | Status: AC
  Filled 2020-03-11: qty 5

## 2020-03-11 MED ORDER — SODIUM CHLORIDE 0.9 % IV SOLN
670.8000 mg | Freq: Once | INTRAVENOUS | Status: AC
  Administered 2020-03-11: 670 mg via INTRAVENOUS
  Filled 2020-03-11: qty 67

## 2020-03-11 MED ORDER — ACETAMINOPHEN 325 MG PO TABS
650.0000 mg | ORAL_TABLET | Freq: Once | ORAL | Status: AC
  Administered 2020-03-11: 650 mg via ORAL

## 2020-03-11 MED ORDER — PALONOSETRON HCL INJECTION 0.25 MG/5ML
0.2500 mg | Freq: Once | INTRAVENOUS | Status: AC
  Administered 2020-03-11: 0.25 mg via INTRAVENOUS

## 2020-03-11 MED ORDER — ACETAMINOPHEN 325 MG PO TABS
ORAL_TABLET | ORAL | Status: AC
  Filled 2020-03-11: qty 2

## 2020-03-11 NOTE — Patient Instructions (Signed)
Carboplatin injection What is this medicine? CARBOPLATIN (KAR boe pla tin) is a chemotherapy drug. It targets fast dividing cells, like cancer cells, and causes these cells to die. This medicine is used to treat ovarian cancer and many other cancers. This medicine may be used for other purposes; ask your health care provider or pharmacist if you have questions. COMMON BRAND NAME(S): Paraplatin What should I tell my health care provider before I take this medicine? They need to know if you have any of these conditions:  blood disorders  hearing problems  kidney disease  recent or ongoing radiation therapy  an unusual or allergic reaction to carboplatin, cisplatin, other chemotherapy, other medicines, foods, dyes, or preservatives  pregnant or trying to get pregnant  breast-feeding How should I use this medicine? This drug is usually given as an infusion into a vein. It is administered in a hospital or clinic by a specially trained health care professional. Talk to your pediatrician regarding the use of this medicine in children. Special care may be needed. Overdosage: If you think you have taken too much of this medicine contact a poison control center or emergency room at once. NOTE: This medicine is only for you. Do not share this medicine with others. What if I miss a dose? It is important not to miss a dose. Call your doctor or health care professional if you are unable to keep an appointment. What may interact with this medicine?  medicines for seizures  medicines to increase blood counts like filgrastim, pegfilgrastim, sargramostim  some antibiotics like amikacin, gentamicin, neomycin, streptomycin, tobramycin  vaccines Talk to your doctor or health care professional before taking any of these medicines:  acetaminophen  aspirin  ibuprofen  ketoprofen  naproxen This list may not describe all possible interactions. Give your health care provider a list of all the  medicines, herbs, non-prescription drugs, or dietary supplements you use. Also tell them if you smoke, drink alcohol, or use illegal drugs. Some items may interact with your medicine. What should I watch for while using this medicine? Your condition will be monitored carefully while you are receiving this medicine. You will need important blood work done while you are taking this medicine. This drug may make you feel generally unwell. This is not uncommon, as chemotherapy can affect healthy cells as well as cancer cells. Report any side effects. Continue your course of treatment even though you feel ill unless your doctor tells you to stop. In some cases, you may be given additional medicines to help with side effects. Follow all directions for their use. Call your doctor or health care professional for advice if you get a fever, chills or sore throat, or other symptoms of a cold or flu. Do not treat yourself. This drug decreases your body's ability to fight infections. Try to avoid being around people who are sick. This medicine may increase your risk to bruise or bleed. Call your doctor or health care professional if you notice any unusual bleeding. Be careful brushing and flossing your teeth or using a toothpick because you may get an infection or bleed more easily. If you have any dental work done, tell your dentist you are receiving this medicine. Avoid taking products that contain aspirin, acetaminophen, ibuprofen, naproxen, or ketoprofen unless instructed by your doctor. These medicines may hide a fever. Do not become pregnant while taking this medicine. Women should inform their doctor if they wish to become pregnant or think they might be pregnant. There is a  potential for serious side effects to an unborn child. Talk to your health care professional or pharmacist for more information. Do not breast-feed an infant while taking this medicine. What side effects may I notice from receiving this  medicine? Side effects that you should report to your doctor or health care professional as soon as possible:  allergic reactions like skin rash, itching or hives, swelling of the face, lips, or tongue  signs of infection - fever or chills, cough, sore throat, pain or difficulty passing urine  signs of decreased platelets or bleeding - bruising, pinpoint red spots on the skin, black, tarry stools, nosebleeds  signs of decreased red blood cells - unusually weak or tired, fainting spells, lightheadedness  breathing problems  changes in hearing  changes in vision  chest pain  high blood pressure  low blood counts - This drug may decrease the number of white blood cells, red blood cells and platelets. You may be at increased risk for infections and bleeding.  nausea and vomiting  pain, swelling, redness or irritation at the injection site  pain, tingling, numbness in the hands or feet  problems with balance, talking, walking  trouble passing urine or change in the amount of urine Side effects that usually do not require medical attention (report to your doctor or health care professional if they continue or are bothersome):  hair loss  loss of appetite  metallic taste in the mouth or changes in taste This list may not describe all possible side effects. Call your doctor for medical advice about side effects. You may report side effects to FDA at 1-800-FDA-1088. Where should I keep my medicine? This drug is given in a hospital or clinic and will not be stored at home. NOTE: This sheet is a summary. It may not cover all possible information. If you have questions about this medicine, talk to your doctor, pharmacist, or health care provider.  2021 Elsevier/Gold Standard (2007-04-24 14:38:05) Docetaxel injection What is this medicine? DOCETAXEL (doe se TAX el) is a chemotherapy drug. It targets fast dividing cells, like cancer cells, and causes these cells to die. This medicine  is used to treat many types of cancers like breast cancer, certain stomach cancers, head and neck cancer, lung cancer, and prostate cancer. This medicine may be used for other purposes; ask your health care provider or pharmacist if you have questions. COMMON BRAND NAME(S): Docefrez, Taxotere What should I tell my health care provider before I take this medicine? They need to know if you have any of these conditions:  infection (especially a virus infection such as chickenpox, cold sores, or herpes)  liver disease  low blood counts, like low white cell, platelet, or red cell counts  an unusual or allergic reaction to docetaxel, polysorbate 80, other chemotherapy agents, other medicines, foods, dyes, or preservatives  pregnant or trying to get pregnant  breast-feeding How should I use this medicine? This drug is given as an infusion into a vein. It is administered in a hospital or clinic by a specially trained health care professional. Talk to your pediatrician regarding the use of this medicine in children. Special care may be needed. Overdosage: If you think you have taken too much of this medicine contact a poison control center or emergency room at once. NOTE: This medicine is only for you. Do not share this medicine with others. What if I miss a dose? It is important not to miss your dose. Call your doctor or health care professional  if you are unable to keep an appointment. What may interact with this medicine? Do not take this medicine with any of the following medications:  live virus vaccines This medicine may also interact with the following medications:  aprepitant  certain antibiotics like erythromycin or clarithromycin  certain antivirals for HIV or hepatitis  certain medicines for fungal infections like fluconazole, itraconazole, ketoconazole, posaconazole, or  voriconazole  cimetidine  ciprofloxacin  conivaptan  cyclosporine  dronedarone  fluvoxamine  grapefruit juice  imatinib  verapamil This list may not describe all possible interactions. Give your health care provider a list of all the medicines, herbs, non-prescription drugs, or dietary supplements you use. Also tell them if you smoke, drink alcohol, or use illegal drugs. Some items may interact with your medicine. What should I watch for while using this medicine? Your condition will be monitored carefully while you are receiving this medicine. You will need important blood work done while you are taking this medicine. Call your doctor or health care professional for advice if you get a fever, chills or sore throat, or other symptoms of a cold or flu. Do not treat yourself. This drug decreases your body's ability to fight infections. Try to avoid being around people who are sick. Some products may contain alcohol. Ask your health care professional if this medicine contains alcohol. Be sure to tell all health care professionals you are taking this medicine. Certain medicines, like metronidazole and disulfiram, can cause an unpleasant reaction when taken with alcohol. The reaction includes flushing, headache, nausea, vomiting, sweating, and increased thirst. The reaction can last from 30 minutes to several hours. You may get drowsy or dizzy. Do not drive, use machinery, or do anything that needs mental alertness until you know how this medicine affects you. Do not stand or sit up quickly, especially if you are an older patient. This reduces the risk of dizzy or fainting spells. Alcohol may interfere with the effect of this medicine. Talk to your health care professional about your risk of cancer. You may be more at risk for certain types of cancer if you take this medicine. Do not become pregnant while taking this medicine or for 6 months after stopping it. Women should inform their doctor if  they wish to become pregnant or think they might be pregnant. There is a potential for serious side effects to an unborn child. Talk to your health care professional or pharmacist for more information. Do not breast-feed an infant while taking this medicine or for 1 week after stopping it. Males who get this medicine must use a condom during sex with females who can get pregnant. If you get a woman pregnant, the baby could have birth defects. The baby could die before they are born. You will need to continue wearing a condom for 3 months after stopping the medicine. Tell your health care provider right away if your partner becomes pregnant while you are taking this medicine. This may interfere with the ability to father a child. You should talk to your doctor or health care professional if you are concerned about your fertility. What side effects may I notice from receiving this medicine? Side effects that you should report to your doctor or health care professional as soon as possible:  allergic reactions like skin rash, itching or hives, swelling of the face, lips, or tongue  blurred vision  breathing problems  changes in vision  low blood counts - This drug may decrease the number of white blood  cells, red blood cells and platelets. You may be at increased risk for infections and bleeding.  nausea and vomiting  pain, redness or irritation at site where injected  pain, tingling, numbness in the hands or feet  redness, blistering, peeling, or loosening of the skin, including inside the mouth  signs of decreased platelets or bleeding - bruising, pinpoint red spots on the skin, black, tarry stools, nosebleeds  signs of decreased red blood cells - unusually weak or tired, fainting spells, lightheadedness  signs of infection - fever or chills, cough, sore throat, pain or difficulty passing urine  swelling of the ankle, feet, hands Side effects that usually do not require medical attention  (report to your doctor or health care professional if they continue or are bothersome):  constipation  diarrhea  fingernail or toenail changes  hair loss  loss of appetite  mouth sores  muscle pain This list may not describe all possible side effects. Call your doctor for medical advice about side effects. You may report side effects to FDA at 1-800-FDA-1088. Where should I keep my medicine? This drug is given in a hospital or clinic and will not be stored at home. NOTE: This sheet is a summary. It may not cover all possible information. If you have questions about this medicine, talk to your doctor, pharmacist, or health care provider.  2021 Elsevier/Gold Standard (2018-12-17 19:50:31) Pertuzumab injection What is this medicine? PERTUZUMAB (per TOOZ ue mab) is a monoclonal antibody. It is used to treat breast cancer. This medicine may be used for other purposes; ask your health care provider or pharmacist if you have questions. COMMON BRAND NAME(S): PERJETA What should I tell my health care provider before I take this medicine? They need to know if you have any of these conditions:  heart disease  heart failure  high blood pressure  history of irregular heart beat  recent or ongoing radiation therapy  an unusual or allergic reaction to pertuzumab, other medicines, foods, dyes, or preservatives  pregnant or trying to get pregnant  breast-feeding How should I use this medicine? This medicine is for infusion into a vein. It is given by a health care professional in a hospital or clinic setting. Talk to your pediatrician regarding the use of this medicine in children. Special care may be needed. Overdosage: If you think you have taken too much of this medicine contact a poison control center or emergency room at once. NOTE: This medicine is only for you. Do not share this medicine with others. What if I miss a dose? It is important not to miss your dose. Call your  doctor or health care professional if you are unable to keep an appointment. What may interact with this medicine? Interactions are not expected. Give your health care provider a list of all the medicines, herbs, non-prescription drugs, or dietary supplements you use. Also tell them if you smoke, drink alcohol, or use illegal drugs. Some items may interact with your medicine. This list may not describe all possible interactions. Give your health care provider a list of all the medicines, herbs, non-prescription drugs, or dietary supplements you use. Also tell them if you smoke, drink alcohol, or use illegal drugs. Some items may interact with your medicine. What should I watch for while using this medicine? Your condition will be monitored carefully while you are receiving this medicine. Report any side effects. Continue your course of treatment even though you feel ill unless your doctor tells you to stop. Do  not become pregnant while taking this medicine or for 7 months after stopping it. Women should inform their doctor if they wish to become pregnant or think they might be pregnant. Women of child-bearing potential will need to have a negative pregnancy test before starting this medicine. There is a potential for serious side effects to an unborn child. Talk to your health care professional or pharmacist for more information. Do not breast-feed an infant while taking this medicine or for 7 months after stopping it. Women must use effective birth control with this medicine. Call your doctor or health care professional for advice if you get a fever, chills or sore throat, or other symptoms of a cold or flu. Do not treat yourself. Try to avoid being around people who are sick. You may experience fever, chills, and headache during the infusion. Report any side effects during the infusion to your health care professional. What side effects may I notice from receiving this medicine? Side effects that you  should report to your doctor or health care professional as soon as possible:  breathing problems  chest pain or palpitations  dizziness  feeling faint or lightheaded  fever or chills  skin rash, itching or hives  sore throat  swelling of the face, lips, or tongue  swelling of the legs or ankles  unusually weak or tired Side effects that usually do not require medical attention (report to your doctor or health care professional if they continue or are bothersome):  diarrhea  hair loss  nausea, vomiting  tiredness This list may not describe all possible side effects. Call your doctor for medical advice about side effects. You may report side effects to FDA at 1-800-FDA-1088. Where should I keep my medicine? This drug is given in a hospital or clinic and will not be stored at home. NOTE: This sheet is a summary. It may not cover all possible information. If you have questions about this medicine, talk to your doctor, pharmacist, or health care provider.  2021 Elsevier/Gold Standard (2015-02-19 12:08:50) Trastuzumab injection for infusion What is this medicine? TRASTUZUMAB (tras TOO zoo mab) is a monoclonal antibody. It is used to treat breast cancer and stomach cancer. This medicine may be used for other purposes; ask your health care provider or pharmacist if you have questions. COMMON BRAND NAME(S): Herceptin, Galvin Proffer, Trazimera What should I tell my health care provider before I take this medicine? They need to know if you have any of these conditions:  heart disease  heart failure  lung or breathing disease, like asthma  an unusual or allergic reaction to trastuzumab, benzyl alcohol, or other medications, foods, dyes, or preservatives  pregnant or trying to get pregnant  breast-feeding How should I use this medicine? This drug is given as an infusion into a vein. It is administered in a hospital or clinic by a specially trained  health care professional. Talk to your pediatrician regarding the use of this medicine in children. This medicine is not approved for use in children. Overdosage: If you think you have taken too much of this medicine contact a poison control center or emergency room at once. NOTE: This medicine is only for you. Do not share this medicine with others. What if I miss a dose? It is important not to miss a dose. Call your doctor or health care professional if you are unable to keep an appointment. What may interact with this medicine? This medicine may interact with the following medications:  certain types of chemotherapy, such as daunorubicin, doxorubicin, epirubicin, and idarubicin This list may not describe all possible interactions. Give your health care provider a list of all the medicines, herbs, non-prescription drugs, or dietary supplements you use. Also tell them if you smoke, drink alcohol, or use illegal drugs. Some items may interact with your medicine. What should I watch for while using this medicine? Visit your doctor for checks on your progress. Report any side effects. Continue your course of treatment even though you feel ill unless your doctor tells you to stop. Call your doctor or health care professional for advice if you get a fever, chills or sore throat, or other symptoms of a cold or flu. Do not treat yourself. Try to avoid being around people who are sick. You may experience fever, chills and shaking during your first infusion. These effects are usually mild and can be treated with other medicines. Report any side effects during the infusion to your health care professional. Fever and chills usually do not happen with later infusions. Do not become pregnant while taking this medicine or for 7 months after stopping it. Women should inform their doctor if they wish to become pregnant or think they might be pregnant. Women of child-bearing potential will need to have a negative  pregnancy test before starting this medicine. There is a potential for serious side effects to an unborn child. Talk to your health care professional or pharmacist for more information. Do not breast-feed an infant while taking this medicine or for 7 months after stopping it. Women must use effective birth control with this medicine. What side effects may I notice from receiving this medicine? Side effects that you should report to your doctor or health care professional as soon as possible:  allergic reactions like skin rash, itching or hives, swelling of the face, lips, or tongue  chest pain or palpitations  cough  dizziness  feeling faint or lightheaded, falls  fever  general ill feeling or flu-like symptoms  signs of worsening heart failure like breathing problems; swelling in your legs and feet  unusually weak or tired Side effects that usually do not require medical attention (report to your doctor or health care professional if they continue or are bothersome):  bone pain  changes in taste  diarrhea  joint pain  nausea/vomiting  weight loss This list may not describe all possible side effects. Call your doctor for medical advice about side effects. You may report side effects to FDA at 1-800-FDA-1088. Where should I keep my medicine? This drug is given in a hospital or clinic and will not be stored at home. NOTE: This sheet is a summary. It may not cover all possible information. If you have questions about this medicine, talk to your doctor, pharmacist, or health care provider.  2021 Elsevier/Gold Standard (2016-01-12 14:37:52)

## 2020-03-13 ENCOUNTER — Other Ambulatory Visit: Payer: Self-pay

## 2020-03-13 ENCOUNTER — Inpatient Hospital Stay: Payer: PRIVATE HEALTH INSURANCE

## 2020-03-13 VITALS — BP 170/92 | HR 121 | Temp 97.8°F | Resp 18 | Wt 162.0 lb

## 2020-03-13 DIAGNOSIS — Z171 Estrogen receptor negative status [ER-]: Secondary | ICD-10-CM

## 2020-03-13 DIAGNOSIS — Z5112 Encounter for antineoplastic immunotherapy: Secondary | ICD-10-CM | POA: Diagnosis not present

## 2020-03-13 MED ORDER — PEGFILGRASTIM-JMDB 6 MG/0.6ML ~~LOC~~ SOSY
6.0000 mg | PREFILLED_SYRINGE | Freq: Once | SUBCUTANEOUS | Status: AC
  Administered 2020-03-13: 6 mg via SUBCUTANEOUS

## 2020-03-13 MED ORDER — PEGFILGRASTIM-JMDB 6 MG/0.6ML ~~LOC~~ SOSY
PREFILLED_SYRINGE | SUBCUTANEOUS | Status: AC
  Filled 2020-03-13: qty 0.6

## 2020-03-13 NOTE — Patient Instructions (Signed)
Pegfilgrastim injection What is this medicine? PEGFILGRASTIM (PEG fil gra stim) is a long-acting granulocyte colony-stimulating factor that stimulates the growth of neutrophils, a type of white blood cell important in the body's fight against infection. It is used to reduce the incidence of fever and infection in patients with certain types of cancer who are receiving chemotherapy that affects the bone marrow, and to increase survival after being exposed to high doses of radiation. This medicine may be used for other purposes; ask your health care provider or pharmacist if you have questions. COMMON BRAND NAME(S): Fulphila, Neulasta, Nyvepria, UDENYCA, Ziextenzo What should I tell my health care provider before I take this medicine? They need to know if you have any of these conditions:  kidney disease  latex allergy  ongoing radiation therapy  sickle cell disease  skin reactions to acrylic adhesives (On-Body Injector only)  an unusual or allergic reaction to pegfilgrastim, filgrastim, other medicines, foods, dyes, or preservatives  pregnant or trying to get pregnant  breast-feeding How should I use this medicine? This medicine is for injection under the skin. If you get this medicine at home, you will be taught how to prepare and give the pre-filled syringe or how to use the On-body Injector. Refer to the patient Instructions for Use for detailed instructions. Use exactly as directed. Tell your healthcare provider immediately if you suspect that the On-body Injector may not have performed as intended or if you suspect the use of the On-body Injector resulted in a missed or partial dose. It is important that you put your used needles and syringes in a special sharps container. Do not put them in a trash can. If you do not have a sharps container, call your pharmacist or healthcare provider to get one. Talk to your pediatrician regarding the use of this medicine in children. While this drug  may be prescribed for selected conditions, precautions do apply. Overdosage: If you think you have taken too much of this medicine contact a poison control center or emergency room at once. NOTE: This medicine is only for you. Do not share this medicine with others. What if I miss a dose? It is important not to miss your dose. Call your doctor or health care professional if you miss your dose. If you miss a dose due to an On-body Injector failure or leakage, a new dose should be administered as soon as possible using a single prefilled syringe for manual use. What may interact with this medicine? Interactions have not been studied. This list may not describe all possible interactions. Give your health care provider a list of all the medicines, herbs, non-prescription drugs, or dietary supplements you use. Also tell them if you smoke, drink alcohol, or use illegal drugs. Some items may interact with your medicine. What should I watch for while using this medicine? Your condition will be monitored carefully while you are receiving this medicine. You may need blood work done while you are taking this medicine. Talk to your health care provider about your risk of cancer. You may be more at risk for certain types of cancer if you take this medicine. If you are going to need a MRI, CT scan, or other procedure, tell your doctor that you are using this medicine (On-Body Injector only). What side effects may I notice from receiving this medicine? Side effects that you should report to your doctor or health care professional as soon as possible:  allergic reactions (skin rash, itching or hives, swelling of   the face, lips, or tongue)  back pain  dizziness  fever  pain, redness, or irritation at site where injected  pinpoint red spots on the skin  red or dark-brown urine  shortness of breath or breathing problems  stomach or side pain, or pain at the shoulder  swelling  tiredness  trouble  passing urine or change in the amount of urine  unusual bruising or bleeding Side effects that usually do not require medical attention (report to your doctor or health care professional if they continue or are bothersome):  bone pain  muscle pain This list may not describe all possible side effects. Call your doctor for medical advice about side effects. You may report side effects to FDA at 1-800-FDA-1088. Where should I keep my medicine? Keep out of the reach of children. If you are using this medicine at home, you will be instructed on how to store it. Throw away any unused medicine after the expiration date on the label. NOTE: This sheet is a summary. It may not cover all possible information. If you have questions about this medicine, talk to your doctor, pharmacist, or health care provider.  2021 Elsevier/Gold Standard (2019-02-08 13:20:51)  

## 2020-03-25 NOTE — Progress Notes (Incomplete)
[facility]  [address] [place],  [place]  [address] [phone]  Clinic Day:  03/25/2020  Referring physician: Carolynne Edouard, MD   This document serves as a record of services personally performed by Hosie Poisson, MD. It was created on their behalf by Curry,Lauren E, a trained medical scribe. The creation of this record is based on the scribe's personal observations and the provider's statements to them.  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  Current Treatment:  TCHP IV every 3 weeks for 6 doses, followed by maintenance Herceptin/Perjeta for a total of 1 year of therapy.   HISTORY OF PRESENT ILLNESS:  Brittney Burgess is a 60 y.o. 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  Screening mammogram in August which revealed a mass with calcifications, a second asymmetry and possible dilated ducts within the left breast.  Right breast was negative.  Diagnostic unilateral left mammogram and left breast ultrasound confirmed a 1.6 cm elongated mass containing pleomorphic calcifications at 7 o'clock, a 5 mm hypoechoic indeterminate mass containing 2 benign-appearing calcification at 2 o'clock, and several retroareolar ecstatic ducts containing internal debris or possible tissue.  Ultrasound guided biopsy on September 20th of the mass at 5 o'clock revealed grade 3, invasive ductal carcinoma, grade 3.  Estrogen and progesterone receptors were negative with HER2 positive.  Ki67 was 50%. 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  The mass at 2 o'clock was consistent with fibrocystic changes, including sclerosing adenosis with calcifications.  No atypia, in situ or invasive malignancy.   Biopsy at 11:30 o'clock revealed a  fragmented intraductal papilloma and dilated duct.  No atypia, in situ or invasive malignancy identified.  She underwent lumpectomy with sentinel lymph node biopsy on November 22nd.  Surgical pathology from this procedure revealed a 1.2 cm, grade 3, invasive ductal carcinoma with high grade ductal carcinoma in situ.  One sentinel lymph node was negative for malignancy.  Margins were negative.  She also had placement of a port at that time.  She had a hysterectomy and bilateral salpingo oophorectomy in 2004 due to menorrhagia.  She is unsure if she was given hormone replacement.  She has baseline neuropathy of her feet secondary to diabetes.  She started adjuvant chemotherapy/HER2 targeted therapy with docetaxel/carboplatin/trastuzumab/pertuzumab [facility]) on December 29th.   INTERVAL HISTORY:  Brittney Burgess is here for routine follow up prior to a 3rd cycle of TCHP. She has been tolerating treatment well. Today she complains of increased fatigue, however, she attributes this to her Moderna Covid vaccine that she received 2 days ago. She denies fever, chills, nausea or vomiting. She denies issue with bowel or bladder. She denies shortness of breath, cough or chest pain. Her appetite is good and she has lost 3 pounds since her last visit. She has some neuropathy, but this is no worse and is tolerable. CBC and CMP today are unremarkable.   Brittney Burgess is here for routine follow up prior to a 4th cycle of TCHP.   Her  appetite is good, and she has gained/lost _ pounds since her last visit.  She denies fever, chills or other signs of infection.  She denies nausea, vomiting, bowel issues, or abdominal pain.  She denies sore throat, cough, dyspnea, or chest  pain.  REVIEW OF SYSTEMS:  Review of Systems - Oncology   VITALS:  There were no vitals taken for this visit.  Wt Readings from Last 3 Encounters:  03/13/20 162 lb (73.5 kg)  03/06/20 163 lb 4.8 oz (74.1 kg)  02/20/20 156 lb 12 oz (71.1 kg)    There is no height or  weight on file to calculate BMI.  Performance status (ECOG): 1 - Symptomatic but completely ambulatory  PHYSICAL EXAM:  Physical Exam  LABS:   CBC Latest Ref Rng & Units 03/06/2020 02/14/2020 01/28/2020  WBC - 10.2 11.4 9.6  Hemoglobin 12.0 - 16.0 11.4(A) 13.2 13.0  Hematocrit 36 - 46 34(A) 39 39  Platelets 150 - 399 206 163 250   CMP Latest Ref Rng & Units 03/06/2020 02/14/2020 01/28/2020  BUN 4 - '21 10 11 14  ' Creatinine 0.5 - 1.1 0.7 0.8 0.9  Sodium 137 - 147 142 141 140  Potassium 3.4 - 5.3 4.0 3.7 3.8  Chloride 99 - 108 109(A) 108 107  CO2 13 - 22 28(A) 27(A) 25(A)  Calcium 8.7 - 10.7 9.3 9.9 -  Alkaline Phos 25 - 125 99 118 -  AST 13 - 35 20 20 -  ALT 7 - 35 12 14 -    STUDIES:    Allergies:  Allergies  Allergen Reactions  . Metformin And Related Other (See Comments)    Twitching, nausea    Current Medications: Current Outpatient Medications  Medication Sig Dispense Refill  . amLODipine (NORVASC) 5 MG tablet Take by mouth.    . dexamethasone (DECADRON) 4 MG tablet Take 2 tabs twice daily the day before chemotherapy and for 2 days after chemotherapy 60 tablet 0  . hydrochlorothiazide (HYDRODIURIL) 25 MG tablet Take by mouth.    . insulin glargine (LANTUS) 100 UNIT/ML Solostar Pen Inject into the skin. 36-41 units    . losartan (COZAAR) 50 MG tablet Take by mouth.    . ondansetron (ZOFRAN) 4 MG tablet Take 1 tablet (4 mg total) by mouth every 4 (four) hours as needed for nausea. 90 tablet 3  . prochlorperazine (COMPAZINE) 10 MG tablet Take 1 tablet (10 mg total) by mouth every 6 (six) hours as needed for nausea or vomiting. 90 tablet 3   No current facility-administered medications for this visit.     ASSESSMENT & PLAN:   Assessment:   1. 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   She is receiving adjuvant TCHP IV every 3 weeks for 6 cycles, with plans to continue with maintenance Herceptin/Perjeta for a  total of 1 year  2. Neuropathy of bilateral feet.  This is currently well controlled.  3. Tobacco abuse.  We discussed the importance of smoking cessation.  She has a prescription for Nicorette gum.  Plan:   She will proceed with a 4th cycle of TCHP on March 7th.  We will plan to see her back in 3 weeks with a CBC and comprehensive metabolic panel prior to a 5th cycle.  The patient understands the plans discussed today and is in agreement with them.  She knows to contact our office if she develops concerns prior to her next appointment.    Derwood Kaplan, MD [facility] AT [facility] [address] [place] [address] Dept: [phone] Dept Fax: [phone]    I, Rita Ohara, am acting as scribe for Derwood Kaplan, MD  I have reviewed this report as typed by the medical scribe, and it is complete and accurate.

## 2020-03-27 ENCOUNTER — Encounter: Payer: Self-pay | Admitting: Hematology and Oncology

## 2020-03-27 ENCOUNTER — Inpatient Hospital Stay (INDEPENDENT_AMBULATORY_CARE_PROVIDER_SITE_OTHER): Payer: PRIVATE HEALTH INSURANCE | Admitting: Hematology and Oncology

## 2020-03-27 ENCOUNTER — Inpatient Hospital Stay: Payer: PRIVATE HEALTH INSURANCE

## 2020-03-27 ENCOUNTER — Other Ambulatory Visit: Payer: Self-pay

## 2020-03-27 VITALS — BP 119/64 | HR 119 | Temp 98.1°F | Resp 18 | Ht 69.0 in | Wt 167.5 lb

## 2020-03-27 DIAGNOSIS — C50512 Malignant neoplasm of lower-outer quadrant of left female breast: Secondary | ICD-10-CM

## 2020-03-27 DIAGNOSIS — Z171 Estrogen receptor negative status [ER-]: Secondary | ICD-10-CM

## 2020-03-27 LAB — CBC AND DIFFERENTIAL
HCT: 35 — AB (ref 36–46)
Hemoglobin: 11.7 — AB (ref 12.0–16.0)
Neutrophils Absolute: 9.03
Platelets: 167 (ref 150–399)
WBC: 10.5

## 2020-03-27 LAB — HEPATIC FUNCTION PANEL
ALT: 33 (ref 7–35)
AST: 64 — AB (ref 13–35)
Alkaline Phosphatase: 106 (ref 25–125)
Bilirubin, Total: 0.5

## 2020-03-27 LAB — BASIC METABOLIC PANEL
BUN: 10 (ref 4–21)
CO2: 28 — AB (ref 13–22)
Chloride: 107 (ref 99–108)
Creatinine: 0.6 (ref 0.5–1.1)
Glucose: 116
Potassium: 3.1 — AB (ref 3.4–5.3)
Sodium: 142 (ref 137–147)

## 2020-03-27 LAB — COMPREHENSIVE METABOLIC PANEL
Albumin: 3.7 (ref 3.5–5.0)
Calcium: 9.2 (ref 8.7–10.7)

## 2020-03-27 LAB — MAGNESIUM (CC13): Magnesium: 1.8

## 2020-03-27 LAB — CBC: RBC: 3.94 (ref 3.87–5.11)

## 2020-03-27 MED ORDER — ONDANSETRON HCL 4 MG PO TABS: 4.0000 mg | ORAL_TABLET | ORAL | 3 refills | Status: DC | PRN

## 2020-03-27 MED ORDER — GABAPENTIN 300 MG PO CAPS: 300.0000 mg | ORAL_CAPSULE | Freq: Three times a day (TID) | ORAL | 0 refills | Status: DC

## 2020-03-27 MED ORDER — POTASSIUM CHLORIDE CRYS ER 20 MEQ PO TBCR: 20.0000 meq | EXTENDED_RELEASE_TABLET | Freq: Two times a day (BID) | ORAL | 1 refills | Status: DC

## 2020-03-27 NOTE — Progress Notes (Signed)
[facility]  [address] 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  Current Treatment:  TCHP IV every 3 weeks for 6 doses, followed by maintenance Herceptin/Perjeta for a total of 1 year of therapy.   HISTORY OF PRESENT ILLNESS:  Brittney Burgess is a 60 y.o. 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  Screening mammogram in August which revealed a mass with calcifications, a second asymmetry and possible dilated ducts within the left breast.  Right breast was negative.  Diagnostic unilateral left mammogram and left breast ultrasound confirmed a 1.6 cm elongated mass containing pleomorphic calcifications at 7 o'clock, a 5 mm hypoechoic indeterminate mass containing 2 benign-appearing calcification at 2 o'clock, and several retroareolar ecstatic ducts containing internal debris or possible tissue.  Ultrasound guided biopsy on September 20th of the mass at 5 o'clock revealed grade 3, invasive ductal carcinoma, grade 3.  Estrogen and progesterone receptors were negative with HER2 positive.  Ki67 was 50%. 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  The mass at 2 o'clock was consistent with fibrocystic changes, including sclerosing adenosis with calcifications.  No atypia, in situ or invasive malignancy.   Biopsy at 11:30 o'clock revealed a fragmented intraductal papilloma and dilated duct.  No atypia, in situ or invasive malignancy identified.  She underwent lumpectomy with sentinel lymph node biopsy on November 22nd.  Surgical pathology from this procedure revealed a 1.2 cm, grade 3, invasive ductal carcinoma with  high grade ductal carcinoma in situ.  One sentinel lymph node was negative for malignancy.  Margins were negative.  She also had placement of a port at that time.  She had a hysterectomy and bilateral salpingo oophorectomy in 2004 due to menorrhagia.  She is unsure if she was given hormone replacement.  She has baseline neuropathy of her feet secondary to diabetes.  She started adjuvant chemotherapy/HER2 targeted therapy with docetaxel/carboplatin/trastuzumab/pertuzumab [facility]) on December 29th.   INTERVAL HISTORY:  [name] is here for evaluation prior to a 4th cycle of TCHP on March 2. She has been tolerating treatment well. She has had some increasing neuropathy and is now agreeable to treat it. She had a few days of diarrhea that has since resolved. She has imodium at home, but did not use it. She also has a couple of days of nausea after treatments. This is controlled with antiemetics. She denies fever or chills She denies shortness of breath, cough or chest pain. Her appetite is improving, however, her weight today is down 4 pounds since last visit. CBC is unremarkable today. CMP reveals potassium 3.1.  REVIEW OF SYSTEMS:  Review of Systems  Constitutional: Negative for appetite change, chills, diaphoresis, fatigue, fever and unexpected weight change.  HENT:   Negative for hearing loss, lump/mass, mouth sores, nosebleeds, sore throat, tinnitus, trouble swallowing and voice change.   Eyes: Negative for eye problems and icterus.  Respiratory: Negative for chest tightness, cough, hemoptysis, shortness of breath and wheezing.   Cardiovascular: Negative for chest pain, leg swelling and palpitations.  Gastrointestinal: Positive for diarrhea. Negative for abdominal distention, abdominal pain, blood in stool, constipation, nausea, rectal pain and vomiting.  Endocrine: Negative for hot flashes.  Genitourinary: Negative for bladder incontinence, difficulty urinating, dyspareunia, dysuria, frequency, hematuria  and nocturia.   Musculoskeletal: Negative for arthralgias, back pain, flank pain, gait problem, myalgias, neck pain and neck stiffness.  Skin: Negative for itching, rash and wound.  Neurological: Positive for numbness. Negative for dizziness, extremity weakness, gait problem, headaches, light-headedness, seizures and speech difficulty.  Hematological: Negative for adenopathy. Does not bruise/bleed easily.  Psychiatric/Behavioral: Negative for confusion, decreased concentration, depression, sleep disturbance and suicidal ideas. The patient is not nervous/anxious.      VITALS:  Blood pressure 119/64, pulse (!) 119, temperature 98.1 F (36.7 C), temperature source Oral, resp. rate 18, height '5\' 9"'  (1.753 m), weight 167 lb 8 oz (76 kg), SpO2 98 %.  Wt Readings from Last 3 Encounters:  03/27/20 167 lb 8 oz (76 kg)  03/13/20 162 lb (73.5 kg)  03/06/20 163 lb 4.8 oz (74.1 kg)    Body mass index is 24.74 kg/m.  Performance status (ECOG): 1 - Symptomatic but completely ambulatory  PHYSICAL EXAM:  Physical Exam Constitutional:      General: She is not in acute distress.    Appearance: Normal appearance. She is normal weight. She is not ill-appearing, toxic-appearing or diaphoretic.  HENT:     Head: Normocephalic and atraumatic.     Nose: Nose normal. No congestion or rhinorrhea.     Mouth/Throat:     Mouth: Mucous membranes are moist.     Pharynx: Oropharynx is clear. No oropharyngeal exudate or posterior oropharyngeal erythema.  Eyes:     General: No scleral icterus.       Right eye: No discharge.        Left eye: No discharge.     Extraocular Movements: Extraocular movements intact.     Conjunctiva/sclera: Conjunctivae normal.     Pupils: Pupils are equal, round, and reactive to light.  Neck:     Vascular: No carotid bruit.  Cardiovascular:     Rate and Rhythm: Normal rate and regular rhythm.     Heart sounds: No murmur heard. No friction rub. No gallop.   Pulmonary:     Effort:  Pulmonary effort is normal. No respiratory distress.     Breath sounds: Normal breath sounds. No stridor. No wheezing, rhonchi or rales.  Chest:     Chest wall: No tenderness.  Abdominal:     General: Abdomen is flat. Bowel sounds are normal. There is no distension.     Palpations: There is no mass.     Tenderness: There is no abdominal tenderness. There is no right CVA tenderness, left CVA tenderness, guarding or rebound.     Hernia: No hernia is present.  Musculoskeletal:        General: No swelling, tenderness, deformity or signs of injury. Normal range of motion.     Cervical back: Normal range of motion and neck supple. No rigidity or tenderness.     Right lower leg: No edema.     Left lower leg: No edema.  Lymphadenopathy:     Cervical: No cervical adenopathy.  Skin:    General: Skin is warm and dry.     Capillary Refill: Capillary refill takes less than 2 seconds.     Coloration: Skin is not jaundiced or pale.     Findings: No bruising, erythema, lesion or rash.  Neurological:     General: No focal deficit present.     Mental Status: She is alert and oriented to person, place,  and time. Mental status is at baseline.     Cranial Nerves: No cranial nerve deficit.     Sensory: No sensory deficit.     Motor: No weakness.     Coordination: Coordination normal.     Gait: Gait normal.     Deep Tendon Reflexes: Reflexes normal.  Psychiatric:        Mood and Affect: Mood normal.        Behavior: Behavior normal.        Thought Content: Thought content normal.        Judgment: Judgment normal.     LABS:   CBC Latest Ref Rng & Units 03/27/2020 03/06/2020 02/14/2020  WBC - 10.5 10.2 11.4  Hemoglobin 12.0 - 16.0 11.7(A) 11.4(A) 13.2  Hematocrit 36 - 46 35(A) 34(A) 39  Platelets 150 - 399 167 206 163   CMP Latest Ref Rng & Units 03/27/2020 03/06/2020 02/14/2020  BUN 4 - '21 10 10 11  ' Creatinine 0.5 - 1.1 0.6 0.7 0.8  Sodium 137 - 147 142 142 141  Potassium 3.4 - 5.3 3.1(A) 4.0 3.7   Chloride 99 - 108 107 109(A) 108  CO2 13 - 22 28(A) 28(A) 27(A)  Calcium 8.7 - 10.7 9.2 9.3 9.9  Alkaline Phos 25 - 125 106 99 118  AST 13 - 35 64(A) 20 20  ALT 7 - 35 33 12 14    STUDIES:    Allergies:  Allergies  Allergen Reactions  . Metformin And Related Other (See Comments)    Twitching, nausea    Current Medications: Current Outpatient Medications  Medication Sig Dispense Refill  . gabapentin (NEURONTIN) 300 MG capsule Take 1 capsule (300 mg total) by mouth 3 (three) times daily. 90 capsule 0  . ondansetron (ZOFRAN) 4 MG tablet Take 1 tablet (4 mg total) by mouth every 4 (four) hours as needed for nausea. 90 tablet 3  . potassium chloride SA (KLOR-CON) 20 MEQ tablet Take 1 tablet (20 mEq total) by mouth 2 (two) times daily. 60 tablet 1  . dexamethasone (DECADRON) 4 MG tablet Take 2 tabs twice daily the day before chemotherapy and for 2 days after chemotherapy 60 tablet 0  . hydrochlorothiazide (HYDRODIURIL) 25 MG tablet Take by mouth.    . insulin glargine (LANTUS) 100 UNIT/ML Solostar Pen Inject into the skin. 36-41 units    . losartan (COZAAR) 50 MG tablet Take by mouth.    . nicotine polacrilex (NICORETTE) 2 MG gum Chew 1 piece in mouth every two hours as directed every 1-2 hours when you have urge to smoke, max 24 pieces per day    . ondansetron (ZOFRAN) 4 MG tablet Take 1 tablet (4 mg total) by mouth every 4 (four) hours as needed for nausea. 90 tablet 3  . oxyCODONE (OXY IR/ROXICODONE) 5 MG immediate release tablet SMARTSIG:1 By Mouth 4-5 Times Daily    . prochlorperazine (COMPAZINE) 10 MG tablet Take 1 tablet (10 mg total) by mouth every 6 (six) hours as needed for nausea or vomiting. 90 tablet 3   No current facility-administered medications for this visit.     ASSESSMENT & PLAN:   Assessment:   1. 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   She is receiving adjuvant TCHP IV every 3 weeks for 6 cycles,  with plans to continue with maintenance Herceptin/Perjeta for a total of 1 year. She is due cycle 4 on March 2.    2.  Hypokalemia related to diarrhea. Her diarrhea has improved. We discussed the use of imodium.  3. Neuropathy. Increasing to bilateral hands and feet.   Plan:   She will proceed with a 4th cycle of TCHP on March 7th. I will start Gabapentin 300 mg three times daily. She will also begin potassium 20 mEq twice daily. I have refilled her ondansetron.   We will plan to see her back in 3 weeks with a CBC and comprehensive metabolic panel prior to a 5th cycle.    She verbalizes understanding of and agreement to the plans discussed today. She knows to call the office should any new questions or concern arise.   [name], NP [facility] AT [facility] [address] [place] [address] Dept: [phone] Dept Fax: [phone]

## 2020-03-30 ENCOUNTER — Other Ambulatory Visit: Payer: Self-pay | Admitting: Oncology

## 2020-03-31 ENCOUNTER — Encounter: Payer: Self-pay | Admitting: Oncology

## 2020-03-31 ENCOUNTER — Telehealth: Payer: Self-pay | Admitting: Oncology

## 2020-03-31 NOTE — Progress Notes (Unsigned)
Per [employer] @ [facility] [phone]; pt's plan is active and no PA is required for CPT 93306 ECHO REF#[id number]/02/2020

## 2020-03-31 NOTE — Telephone Encounter (Signed)
03/31/20 Spoke with patient and sched Echo on 3/16@9am 

## 2020-04-01 ENCOUNTER — Other Ambulatory Visit: Payer: Self-pay | Admitting: Hematology and Oncology

## 2020-04-01 ENCOUNTER — Inpatient Hospital Stay: Payer: PRIVATE HEALTH INSURANCE | Attending: Oncology

## 2020-04-01 ENCOUNTER — Other Ambulatory Visit: Payer: Self-pay

## 2020-04-01 ENCOUNTER — Other Ambulatory Visit: Payer: Self-pay | Admitting: Pharmacist

## 2020-04-01 VITALS — BP 201/107 | HR 106 | Temp 97.7°F | Resp 18 | Ht 69.0 in | Wt 164.8 lb

## 2020-04-01 DIAGNOSIS — Z5111 Encounter for antineoplastic chemotherapy: Secondary | ICD-10-CM | POA: Diagnosis present

## 2020-04-01 DIAGNOSIS — Z794 Long term (current) use of insulin: Secondary | ICD-10-CM | POA: Insufficient documentation

## 2020-04-01 DIAGNOSIS — Z79899 Other long term (current) drug therapy: Secondary | ICD-10-CM | POA: Insufficient documentation

## 2020-04-01 DIAGNOSIS — F17210 Nicotine dependence, cigarettes, uncomplicated: Secondary | ICD-10-CM | POA: Insufficient documentation

## 2020-04-01 DIAGNOSIS — R531 Weakness: Secondary | ICD-10-CM | POA: Diagnosis not present

## 2020-04-01 DIAGNOSIS — C50512 Malignant neoplasm of lower-outer quadrant of left female breast: Secondary | ICD-10-CM | POA: Diagnosis present

## 2020-04-01 DIAGNOSIS — Z170 Estrogen receptor positive status [ER+]: Secondary | ICD-10-CM | POA: Insufficient documentation

## 2020-04-01 DIAGNOSIS — E1142 Type 2 diabetes mellitus with diabetic polyneuropathy: Secondary | ICD-10-CM | POA: Insufficient documentation

## 2020-04-01 DIAGNOSIS — E876 Hypokalemia: Secondary | ICD-10-CM | POA: Diagnosis not present

## 2020-04-01 DIAGNOSIS — Z171 Estrogen receptor negative status [ER-]: Secondary | ICD-10-CM

## 2020-04-01 MED ORDER — CLONIDINE HCL 0.1 MG PO TABS
ORAL_TABLET | ORAL | Status: AC
  Filled 2020-04-01: qty 1

## 2020-04-01 MED ORDER — PERTUZUMAB CHEMO INJECTION 420 MG/14ML
420.0000 mg | Freq: Once | INTRAVENOUS | Status: AC
  Administered 2020-04-01: 420 mg via INTRAVENOUS
  Filled 2020-04-01: qty 14

## 2020-04-01 MED ORDER — CLONIDINE HCL 0.1 MG PO TABS
0.1000 mg | ORAL_TABLET | Freq: Once | ORAL | Status: AC
  Administered 2020-04-01: 0.1 mg via ORAL

## 2020-04-01 MED ORDER — HEPARIN SOD (PORK) LOCK FLUSH 100 UNIT/ML IV SOLN
500.0000 [IU] | Freq: Once | INTRAVENOUS | Status: AC | PRN
  Administered 2020-04-01: 500 [IU]
  Filled 2020-04-01: qty 5

## 2020-04-01 MED ORDER — HYDROCHLOROTHIAZIDE 25 MG PO TABS: 25.0000 mg | ORAL_TABLET | Freq: Every day | ORAL | 0 refills | Status: DC

## 2020-04-01 MED ORDER — SODIUM CHLORIDE 0.9 % IV SOLN
Freq: Once | INTRAVENOUS | Status: AC
  Filled 2020-04-01: qty 250

## 2020-04-01 MED ORDER — PALONOSETRON HCL INJECTION 0.25 MG/5ML
0.2500 mg | Freq: Once | INTRAVENOUS | Status: AC
  Administered 2020-04-01: 0.25 mg via INTRAVENOUS

## 2020-04-01 MED ORDER — DIPHENHYDRAMINE HCL 50 MG/ML IJ SOLN
25.0000 mg | Freq: Once | INTRAMUSCULAR | Status: AC
  Administered 2020-04-01: 25 mg via INTRAVENOUS

## 2020-04-01 MED ORDER — AMLODIPINE BESYLATE 5 MG PO TABS: 5.0000 mg | ORAL_TABLET | Freq: Every day | ORAL | 0 refills | Status: DC

## 2020-04-01 MED ORDER — DIPHENHYDRAMINE HCL 50 MG/ML IJ SOLN
INTRAMUSCULAR | Status: AC
  Filled 2020-04-01: qty 1

## 2020-04-01 MED ORDER — PALONOSETRON HCL INJECTION 0.25 MG/5ML
INTRAVENOUS | Status: AC
  Filled 2020-04-01: qty 5

## 2020-04-01 MED ORDER — LOSARTAN POTASSIUM 50 MG PO TABS: 50.0000 mg | ORAL_TABLET | Freq: Every day | ORAL | 0 refills | Status: DC

## 2020-04-01 MED ORDER — SODIUM CHLORIDE 0.9 % IV SOLN
150.0000 mg | Freq: Once | INTRAVENOUS | Status: AC
  Administered 2020-04-01: 150 mg via INTRAVENOUS
  Filled 2020-04-01: qty 5

## 2020-04-01 MED ORDER — SODIUM CHLORIDE 0.9 % IV SOLN
75.0000 mg/m2 | Freq: Once | INTRAVENOUS | Status: AC
  Administered 2020-04-01: 150 mg via INTRAVENOUS
  Filled 2020-04-01: qty 15

## 2020-04-01 MED ORDER — SODIUM CHLORIDE 0.9 % IV SOLN
670.8000 mg | Freq: Once | INTRAVENOUS | Status: AC
  Administered 2020-04-01: 670 mg via INTRAVENOUS
  Filled 2020-04-01: qty 67

## 2020-04-01 MED ORDER — ACETAMINOPHEN 325 MG PO TABS
ORAL_TABLET | ORAL | Status: AC
  Filled 2020-04-01: qty 2

## 2020-04-01 MED ORDER — SODIUM CHLORIDE 0.9 % IV SOLN
10.0000 mg | Freq: Once | INTRAVENOUS | Status: AC
  Administered 2020-04-01: 10 mg via INTRAVENOUS
  Filled 2020-04-01: qty 10

## 2020-04-01 MED ORDER — ACETAMINOPHEN 325 MG PO TABS
650.0000 mg | ORAL_TABLET | Freq: Once | ORAL | Status: AC
  Administered 2020-04-01: 650 mg via ORAL

## 2020-04-01 MED ORDER — TRASTUZUMAB-DKST CHEMO 150 MG IV SOLR
6.0000 mg/kg | Freq: Once | INTRAVENOUS | Status: AC
  Administered 2020-04-01: 462 mg via INTRAVENOUS
  Filled 2020-04-01: qty 22

## 2020-04-01 NOTE — Patient Instructions (Signed)
[facility] Discharge Instructions for Patients Receiving Chemotherapy  Today you received the following chemotherapy agents Pertuzumab,Trastuzumab,Carboplatin,Docetaxel  To help prevent nausea and vomiting after your treatment, we encourage you to take your nausea medication as directed   If you develop nausea and vomiting that is not controlled by your nausea medication, call the clinic.   BELOW ARE SYMPTOMS THAT SHOULD BE REPORTED IMMEDIATELY:  *FEVER GREATER THAN 100.5 F  *CHILLS WITH OR WITHOUT FEVER  NAUSEA AND VOMITING THAT IS NOT CONTROLLED WITH YOUR NAUSEA MEDICATION  *UNUSUAL SHORTNESS OF BREATH  *UNUSUAL BRUISING OR BLEEDING  TENDERNESS IN MOUTH AND THROAT WITH OR WITHOUT PRESENCE OF ULCERS  *URINARY PROBLEMS  *BOWEL PROBLEMS  UNUSUAL RASH Items with * indicate a potential emergency and should be followed up as soon as possible.  Feel free to call the clinic should you have any questions or concerns at The clinic phone number is [phone].  Please show the [facility] at check-in to the Emergency Department and triage nurse.

## 2020-04-01 NOTE — Progress Notes (Signed)
0922:Called Melissa to report hypertension today and no antihypertensive meds x 1 week. Patient did not have medicaid card yet. She states that she has it now. B/p 184/114 hr- 114 on admission. Recheck b/p 175/103 hr- 114. Melissa NP orders clonidine 0.1 mg now/given at 0920.1025: Melissa NP called to report b/p 168/93 hr-101 after clonidine 0.1 mg po and benadryl IVP. Orders received to proceed with chemotherapy. Check with patient and pharmacy re: refills for antihypertensives let her know if refills needed.1502: Melissa called re;b/p elevated 210/116 hr-107 rechecked 201/107 hr-106 orders received to repeat the clonidine 0.1 mg po now/given.1605: B/p 201/107 hr- 103 Patient does not want to go to ER wants to go home and get meds from pharmacy. Melissa called to report b/p orders received for patient to d/c and to call 911 /ER if changes in condition or chest pain,b/p continues high in two hrs b/p should be down. Patient agrees with plan and verbalizes instructions./df  1615: Patient denies c/o pain or shortness of breath. No concerns for increased medical intervention this hour. PT STABLE AT TIME OF DISCHARGE

## 2020-04-03 ENCOUNTER — Other Ambulatory Visit: Payer: Self-pay

## 2020-04-03 ENCOUNTER — Other Ambulatory Visit: Payer: Self-pay | Admitting: Hematology and Oncology

## 2020-04-03 ENCOUNTER — Inpatient Hospital Stay: Payer: PRIVATE HEALTH INSURANCE | Attending: Hematology and Oncology

## 2020-04-03 VITALS — BP 166/106 | HR 114 | Resp 20 | Ht 69.0 in | Wt 156.2 lb

## 2020-04-03 DIAGNOSIS — Z5112 Encounter for antineoplastic immunotherapy: Secondary | ICD-10-CM | POA: Diagnosis not present

## 2020-04-03 DIAGNOSIS — E876 Hypokalemia: Secondary | ICD-10-CM | POA: Insufficient documentation

## 2020-04-03 DIAGNOSIS — Z171 Estrogen receptor negative status [ER-]: Secondary | ICD-10-CM

## 2020-04-03 DIAGNOSIS — T451X5A Adverse effect of antineoplastic and immunosuppressive drugs, initial encounter: Secondary | ICD-10-CM | POA: Diagnosis not present

## 2020-04-03 DIAGNOSIS — R531 Weakness: Secondary | ICD-10-CM | POA: Diagnosis not present

## 2020-04-03 DIAGNOSIS — G620 Drug-induced polyneuropathy: Secondary | ICD-10-CM | POA: Diagnosis not present

## 2020-04-03 DIAGNOSIS — E1142 Type 2 diabetes mellitus with diabetic polyneuropathy: Secondary | ICD-10-CM | POA: Insufficient documentation

## 2020-04-03 DIAGNOSIS — Z5111 Encounter for antineoplastic chemotherapy: Secondary | ICD-10-CM | POA: Diagnosis not present

## 2020-04-03 DIAGNOSIS — Z794 Long term (current) use of insulin: Secondary | ICD-10-CM | POA: Insufficient documentation

## 2020-04-03 DIAGNOSIS — Z90710 Acquired absence of both cervix and uterus: Secondary | ICD-10-CM | POA: Diagnosis not present

## 2020-04-03 DIAGNOSIS — Z170 Estrogen receptor positive status [ER+]: Secondary | ICD-10-CM | POA: Insufficient documentation

## 2020-04-03 DIAGNOSIS — Z90721 Acquired absence of ovaries, unilateral: Secondary | ICD-10-CM | POA: Insufficient documentation

## 2020-04-03 DIAGNOSIS — C50512 Malignant neoplasm of lower-outer quadrant of left female breast: Secondary | ICD-10-CM | POA: Diagnosis present

## 2020-04-03 DIAGNOSIS — F17210 Nicotine dependence, cigarettes, uncomplicated: Secondary | ICD-10-CM | POA: Insufficient documentation

## 2020-04-03 DIAGNOSIS — Z79899 Other long term (current) drug therapy: Secondary | ICD-10-CM | POA: Insufficient documentation

## 2020-04-03 MED ORDER — ALUM & MAG HYDROXIDE-SIMETH 200-200-20 MG/5ML PO SUSP
30.0000 mL | Freq: Once | ORAL | Status: AC
  Administered 2020-04-03: 30 mL via ORAL

## 2020-04-03 MED ORDER — ALUM & MAG HYDROXIDE-SIMETH 200-200-20 MG/5ML PO SUSP
ORAL | Status: AC
  Filled 2020-04-03: qty 30

## 2020-04-03 MED ORDER — PEGFILGRASTIM-JMDB 6 MG/0.6ML ~~LOC~~ SOSY
6.0000 mg | PREFILLED_SYRINGE | Freq: Once | SUBCUTANEOUS | Status: AC
  Administered 2020-04-03: 6 mg via SUBCUTANEOUS

## 2020-04-03 MED ORDER — OMEPRAZOLE 40 MG PO CPDR: 40.0000 mg | DELAYED_RELEASE_CAPSULE | Freq: Every day | ORAL | 3 refills | Status: DC

## 2020-04-03 MED ORDER — PEGFILGRASTIM-JMDB 6 MG/0.6ML ~~LOC~~ SOSY
PREFILLED_SYRINGE | SUBCUTANEOUS | Status: AC
  Filled 2020-04-03: qty 0.6

## 2020-04-03 NOTE — Patient Instructions (Signed)
Aluminum Hydroxide; Magnesium Hydroxide; Simethicone oral suspension What is this medicine? ALUMINUM HYDROXIDE; MAGNESIUM HYDROXIDE; SIMETHICONE (a LOO mi num hye DROX ide; mag NEE zhum hye DROX ide; sye METH i kone) is an antacid and antigas medicine. It is used to relieve the symptoms of indigestion, heartburn, sour stomach, and the discomfort caused by gas. This medicine may be used for other purposes; ask your health care provider or pharmacist if you have questions. COMMON BRAND NAME(S): Alamag Plus, Aldroxicon-1, Almacone, Almacone Double Strength, Antacid, GERI-LANTA, Geri-Mox, Maalox, Maalox Advanced, Maalox Max, Maalox Max Plus [place], Maalox Multi-Symptom, Mi-Acid, Mintox, Mylanta, Mylanta Maximum Strength, Rulox, Rulox Plus, Uni-Lan, Uni-Lan II What should I tell my health care provider before I take this medicine? They need to know if you have any of these conditions:  bowel, intestinal, or stomach disease  constipation  diarrhea  kidney disease  liver disease  on a sodium (salt) restricted diet  stomach bleeding or obstruction  an unusual or allergic reaction to aluminum hydroxide, magnesium hydroxide, simethicone, other medicines, foods, dyes, or preservatives  pregnant or trying to get pregnant  breast-feeding How should I use this medicine? Take this medicine by mouth. Follow the directions on the label. Shake well before using. Use a specially marked spoon or container to measure your medicine. Ask your pharmacist if you do not have one. Household spoons are not accurate. Antacids are usually taken after meals and at bedtime or as directed by your doctor or health care professional. After taking the medication, drink a full glass of water. Take your doses at regular intervals. Do not take your medicine more often than directed. Talk to your pediatrician regarding the use of this medicine in children. While this drug may be prescribed for children as young as 6 years old  for selected conditions, precautions do apply. Overdosage: If you think you have taken too much of this medicine contact a poison control center or emergency room at once. NOTE: This medicine is only for you. Do not share this medicine with others. What if I miss a dose? If you miss a dose, take it as soon as you can. If it is almost time for your next dose, take only that dose. Do not take double or extra doses. What may interact with this medicine?  amphetamine  antibiotics  captopril  delavirdine  gabapentin  heart medicines, such as digoxin or digitoxin  hyoscyamine  iron salts  isoniazid  medicines for breathing difficulties like ipratropium and tiotropium  medicines for diabetes  medicines for fungal infections like itraconazole and ketoconazole  medicines for osteoporosis like alendronate, etidronate, risedronate and tiludronate  medicines for overactive bladder like oxybutynin and tolterodine  medicines for seizures like ethotoin and phenytoin  methenamine  mycophenolate  pancrelipase  penicillamine  phenothiazines like chlorpromazine, mesoridazine, prochlorperazine, thioridazine  quinidine  rosuvastatin  sodium fluoride  sodium polystyrene sulfonate  sotalol  sucralfate  tacrolimus  thyroid hormones like levothyroxine  ursodiol  vitamin D  zalcitabine This list may not describe all possible interactions. Give your health care provider a list of all the medicines, herbs, non-prescription drugs, or dietary supplements you use. Also tell them if you smoke, drink alcohol, or use illegal drugs. Some items may interact with your medicine. What should I watch for while using this medicine? Tell your doctor or healthcare professional if your symptoms do not start to get better or if they get worse. Do not treat yourself for stomach problems with this medicine for more than  2 weeks. See a doctor if you have black tarry stools, rectal bleeding, or  if you feel unusually tired. Do not change to another antacid product without advice. If you are taking other medicines, leave an interval of at least 2 hours before or after taking this medicine. To help reduce constipation, drink several glasses of water a day. What side effects may I notice from receiving this medicine? Side effects that you should report to your doctor or health care professional as soon as possible:  allergic reactions like skin rash, itching or hives, swelling of the face, lips, or tongue  bone or joint aches and pains  confusion or irritability  headache  loss of appetite  nausea, vomiting  unusually weak or tired Side effects that usually do not require medical attention (report to your doctor or health care professional if they continue or are bothersome):  chalky taste  constipation  diarrhea  hemorrhoids This list may not describe all possible side effects. Call your doctor for medical advice about side effects. You may report side effects to FDA at 1-800-FDA-1088. Where should I keep my medicine? Keep out of the reach of children. Store at room temperature between 15 and 30 degrees C (59 and 86 degrees F). Do not freeze. Protect from light and moisture. Throw away any unused medicine after the expiration date. NOTE: This sheet is a summary. It may not cover all possible information. If you have questions about this medicine, talk to your doctor, pharmacist, or health care provider.  2021 Elsevier/Gold Standard (2007-04-12 11:14:19) Pegfilgrastim injection What is this medicine? PEGFILGRASTIM (PEG fil gra stim) is a long-acting granulocyte colony-stimulating factor that stimulates the growth of neutrophils, a type of white blood cell important in the body's fight against infection. It is used to reduce the incidence of fever and infection in patients with certain types of cancer who are receiving chemotherapy that affects the bone marrow, and to  increase survival after being exposed to high doses of radiation. This medicine may be used for other purposes; ask your health care provider or pharmacist if you have questions. COMMON BRAND NAME(S): Rexene Edison, Ziextenzo What should I tell my health care provider before I take this medicine? They need to know if you have any of these conditions:  kidney disease  latex allergy  ongoing radiation therapy  sickle cell disease  skin reactions to acrylic adhesives (On-Body Injector only)  an unusual or allergic reaction to pegfilgrastim, filgrastim, other medicines, foods, dyes, or preservatives  pregnant or trying to get pregnant  breast-feeding How should I use this medicine? This medicine is for injection under the skin. If you get this medicine at home, you will be taught how to prepare and give the pre-filled syringe or how to use the On-body Injector. Refer to the patient Instructions for Use for detailed instructions. Use exactly as directed. Tell your healthcare provider immediately if you suspect that the On-body Injector may not have performed as intended or if you suspect the use of the On-body Injector resulted in a missed or partial dose. It is important that you put your used needles and syringes in a special sharps container. Do not put them in a trash can. If you do not have a sharps container, call your pharmacist or healthcare provider to get one. Talk to your pediatrician regarding the use of this medicine in children. While this drug may be prescribed for selected conditions, precautions do apply. Overdosage: If you think you  have taken too much of this medicine contact a poison control center or emergency room at once. NOTE: This medicine is only for you. Do not share this medicine with others. What if I miss a dose? It is important not to miss your dose. Call your doctor or health care professional if you miss your dose. If you miss a dose due  to an On-body Injector failure or leakage, a new dose should be administered as soon as possible using a single prefilled syringe for manual use. What may interact with this medicine? Interactions have not been studied. This list may not describe all possible interactions. Give your health care provider a list of all the medicines, herbs, non-prescription drugs, or dietary supplements you use. Also tell them if you smoke, drink alcohol, or use illegal drugs. Some items may interact with your medicine. What should I watch for while using this medicine? Your condition will be monitored carefully while you are receiving this medicine. You may need blood work done while you are taking this medicine. Talk to your health care provider about your risk of cancer. You may be more at risk for certain types of cancer if you take this medicine. If you are going to need a MRI, CT scan, or other procedure, tell your doctor that you are using this medicine (On-Body Injector only). What side effects may I notice from receiving this medicine? Side effects that you should report to your doctor or health care professional as soon as possible:  allergic reactions (skin rash, itching or hives, swelling of the face, lips, or tongue)  back pain  dizziness  fever  pain, redness, or irritation at site where injected  pinpoint red spots on the skin  red or dark-brown urine  shortness of breath or breathing problems  stomach or side pain, or pain at the shoulder  swelling  tiredness  trouble passing urine or change in the amount of urine  unusual bruising or bleeding Side effects that usually do not require medical attention (report to your doctor or health care professional if they continue or are bothersome):  bone pain  muscle pain This list may not describe all possible side effects. Call your doctor for medical advice about side effects. You may report side effects to FDA at  1-800-FDA-1088. Where should I keep my medicine? Keep out of the reach of children. If you are using this medicine at home, you will be instructed on how to store it. Throw away any unused medicine after the expiration date on the label. NOTE: This sheet is a summary. It may not cover all possible information. If you have questions about this medicine, talk to your doctor, pharmacist, or health care provider.  2021 Elsevier/Gold Standard (2019-02-08 13:20:51)

## 2020-04-15 ENCOUNTER — Encounter: Payer: Self-pay | Admitting: Oncology

## 2020-04-15 DIAGNOSIS — I517 Cardiomegaly: Secondary | ICD-10-CM | POA: Diagnosis not present

## 2020-04-16 NOTE — Progress Notes (Incomplete)
[facility]  [address] [place],  [place]  [address] [phone]  Clinic Day:  04/16/2020  Referring physician: Carolynne Edouard, MD   This document serves as a record of services personally performed by Hosie Poisson, MD. It was created on their behalf by Curry,Lauren E, a trained medical scribe. The creation of this record is based on the scribe's personal observations and the provider's statements to them.  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  Current Treatment:  TCHP IV every 3 weeks for 6 doses, followed by maintenance Herceptin/Perjeta for a total of 1 year of therapy.   HISTORY OF PRESENT ILLNESS:  Brittney Burgess is a 60 y.o. 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  Screening mammogram in August which revealed a mass with calcifications, a second asymmetry and possible dilated ducts within the left breast.  Right breast was negative.  Diagnostic unilateral left mammogram and left breast ultrasound confirmed a 1.6 cm elongated mass containing pleomorphic calcifications at 7 o'clock, a 5 mm hypoechoic indeterminate mass containing 2 benign-appearing calcification at 2 o'clock, and several retroareolar ecstatic ducts containing internal debris or possible tissue.  Ultrasound guided biopsy on September 20th of the mass at 5 o'clock revealed grade 3, invasive ductal carcinoma, grade 3.  Estrogen and progesterone receptors were negative with HER2 positive.  Ki67 was 50%. 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  The mass at 2 o'clock was consistent with fibrocystic changes, including sclerosing adenosis with calcifications.  No atypia, in situ or invasive malignancy.   Biopsy at 11:30 o'clock revealed a  fragmented intraductal papilloma and dilated duct.  No atypia, in situ or invasive malignancy identified.  She underwent lumpectomy with sentinel lymph node biopsy on November 22nd.  Surgical pathology from this procedure revealed a 1.2 cm, grade 3, invasive ductal carcinoma with high grade ductal carcinoma in situ.  One sentinel lymph node was negative for malignancy.  Margins were negative.  She also had placement of a port at that time.  She had a hysterectomy and bilateral salpingo oophorectomy in 2004 due to menorrhagia.  She is unsure if she was given hormone replacement.  She has baseline neuropathy of her feet secondary to diabetes.  She started adjuvant chemotherapy/HER2 targeted therapy with docetaxel/carboplatin/trastuzumab/pertuzumab [facility]) on December 29th.   INTERVAL HISTORY:  Brittney Burgess is here for evaluation prior to a 4th cycle of TCHP on March 2. She has been tolerating treatment well. She has had some increasing neuropathy and is now agreeable to treat it. She had a few days of diarrhea that has since resolved. She has imodium at home, but did not use it. She also has a couple of days of nausea after treatments. This is controlled with antiemetics. She denies fever or chills She denies shortness of breath, cough or chest pain. Her appetite is improving, however, her weight today is down 4 pounds since last visit. CBC is unremarkable today. CMP reveals potassium 3.1.  Chance is here for follow up prior to a 5th cycle of TCHP.   Her  appetite is good, and she has gained/lost _ pounds since her last visit.  She denies fever, chills or other signs of infection.  She denies nausea, vomiting, bowel issues, or abdominal  pain.  She denies sore throat, cough, dyspnea, or chest pain.  REVIEW OF SYSTEMS:  Review of Systems - Oncology   VITALS:  There were no vitals taken for this visit.  Wt Readings from Last 3 Encounters:  04/03/20 156 lb 4 oz (70.9 kg)  04/01/20 164 lb 12 oz (74.7 kg)  03/27/20  167 lb 8 oz (76 kg)    There is no height or weight on file to calculate BMI.  Performance status (ECOG): 1 - Symptomatic but completely ambulatory  PHYSICAL EXAM:  Physical Exam  LABS:   CBC Latest Ref Rng & Units 03/27/2020 03/06/2020 02/14/2020  WBC - 10.5 10.2 11.4  Hemoglobin 12.0 - 16.0 11.7(A) 11.4(A) 13.2  Hematocrit 36 - 46 35(A) 34(A) 39  Platelets 150 - 399 167 206 163   CMP Latest Ref Rng & Units 03/27/2020 03/06/2020 02/14/2020  BUN 4 - '21 10 10 11  ' Creatinine 0.5 - 1.1 0.6 0.7 0.8  Sodium 137 - 147 142 142 141  Potassium 3.4 - 5.3 3.1(A) 4.0 3.7  Chloride 99 - 108 107 109(A) 108  CO2 13 - 22 28(A) 28(A) 27(A)  Calcium 8.7 - 10.7 9.2 9.3 9.9  Alkaline Phos 25 - 125 106 99 118  AST 13 - 35 64(A) 20 20  ALT 7 - 35 33 12 14    STUDIES:    Allergies:  Allergies  Allergen Reactions  . Metformin And Related Other (See Comments)    Twitching, nausea    Current Medications: Current Outpatient Medications  Medication Sig Dispense Refill  . amLODipine (NORVASC) 5 MG tablet Take 1 tablet (5 mg total) by mouth daily. 30 tablet 0  . dexamethasone (DECADRON) 4 MG tablet TAKE 2 TABLETS BY MOUTH TWICE A DAY THE DAY BEFORE CHEMO. AND FOR 2 DAYS AFTER 60 tablet 0  . gabapentin (NEURONTIN) 300 MG capsule Take 1 capsule (300 mg total) by mouth 3 (three) times daily. 90 capsule 0  . hydrochlorothiazide (HYDRODIURIL) 25 MG tablet Take 1 tablet (25 mg total) by mouth daily. 30 tablet 0  . insulin glargine (LANTUS) 100 UNIT/ML Solostar Pen Inject into the skin. 36-41 units    . losartan (COZAAR) 50 MG tablet Take 1 tablet (50 mg total) by mouth daily. 30 tablet 0  . nicotine polacrilex (NICORETTE) 2 MG gum Chew 1 piece in mouth every two hours as directed every 1-2 hours when you have urge to smoke, max 24 pieces per day    . omeprazole (PRILOSEC) 40 MG capsule Take 1 capsule (40 mg total) by mouth daily. 60 capsule 3  . ondansetron (ZOFRAN) 4 MG tablet Take 1 tablet (4 mg total) by  mouth every 4 (four) hours as needed for nausea. 90 tablet 3  . ondansetron (ZOFRAN) 4 MG tablet Take 1 tablet (4 mg total) by mouth every 4 (four) hours as needed for nausea. 90 tablet 3  . oxyCODONE (OXY IR/ROXICODONE) 5 MG immediate release tablet SMARTSIG:1 By Mouth 4-5 Times Daily    . potassium chloride SA (KLOR-CON) 20 MEQ tablet Take 1 tablet (20 mEq total) by mouth 2 (two) times daily. 60 tablet 1  . prochlorperazine (COMPAZINE) 10 MG tablet Take 1 tablet (10 mg total) by mouth every 6 (six) hours as needed for nausea or vomiting. 90 tablet 3   No current facility-administered medications for this visit.     ASSESSMENT & PLAN:   Assessment:   1. Stage IB (T1cN0M0) HER2 positive invasive ductal carcinoma and high  grade ductal carcinoma in situ.   She is receiving adjuvant TCHP IV every 3 weeks for 6 cycles, with plans to continue with maintenance Herceptin/Perjeta for a total of 1 year.     2. Hypokalemia related to diarrhea. Her diarrhea has improved. We discussed the use of imodium.  3. Neuropathy. Increasing to bilateral hands and feet.   Plan:   She will proceed with a 5th cycle of TCHP on March 23rd. She will continue gabapentin 300 mg three times daily, as well as potassium 20 mEq twice daily.  We will plan to see her back in 3 weeks with a CBC and comprehensive metabolic panel prior to a 6th and final cycle.  She verbalizes understanding of and agreement to the plans discussed today. She knows to call the office should any new questions or concern arise.    Derwood Kaplan, MD [facility] [address] [place] [address] Dept: [phone] Dept Fax: [phone]    I, Rita Ohara, am acting as scribe for Derwood Kaplan, MD  I have reviewed this report as typed by the medical scribe, and it is complete and accurate.

## 2020-04-17 ENCOUNTER — Inpatient Hospital Stay: Payer: PRIVATE HEALTH INSURANCE

## 2020-04-17 ENCOUNTER — Other Ambulatory Visit: Payer: Self-pay | Admitting: Oncology

## 2020-04-17 ENCOUNTER — Inpatient Hospital Stay: Payer: PRIVATE HEALTH INSURANCE | Admitting: Oncology

## 2020-04-17 DIAGNOSIS — Z171 Estrogen receptor negative status [ER-]: Secondary | ICD-10-CM

## 2020-04-17 DIAGNOSIS — C50512 Malignant neoplasm of lower-outer quadrant of left female breast: Secondary | ICD-10-CM

## 2020-04-18 ENCOUNTER — Other Ambulatory Visit: Payer: Self-pay | Admitting: Hematology and Oncology

## 2020-04-18 DIAGNOSIS — E876 Hypokalemia: Secondary | ICD-10-CM

## 2020-04-20 ENCOUNTER — Other Ambulatory Visit: Payer: Self-pay | Admitting: Hematology and Oncology

## 2020-04-20 ENCOUNTER — Telehealth: Payer: Self-pay | Admitting: Oncology

## 2020-04-20 MED ORDER — LOSARTAN POTASSIUM 50 MG PO TABS: 50.0000 mg | ORAL_TABLET | Freq: Every day | ORAL | 1 refills | Status: DC

## 2020-04-20 NOTE — Telephone Encounter (Signed)
Per 3/21 Staff Msg, patient rescheduled to 3/22 Labs 9:30 am - Follow Up w/Kelli 10 am

## 2020-04-21 ENCOUNTER — Telehealth: Payer: Self-pay

## 2020-04-21 ENCOUNTER — Ambulatory Visit: Payer: Self-pay | Admitting: Hematology and Oncology

## 2020-04-21 ENCOUNTER — Other Ambulatory Visit: Payer: Self-pay

## 2020-04-21 ENCOUNTER — Telehealth: Payer: Self-pay | Admitting: Oncology

## 2020-04-21 NOTE — Telephone Encounter (Signed)
Patient stated today that she is suffering from Neuropathy.  Per Estill Dooms, reschedule to next week.

## 2020-04-21 NOTE — Telephone Encounter (Signed)
CALLED PATIENT TO CHECK ON HER REGARDING HER MISSED APPT. THE PATIENT STATED THAT SHE COULD NOT STAND VERY LONG ON HER LEGS BECAUSE OF THE NEUROPATHY. I  INFORMED THE PT THAT SHE NEEDED TO SEE A PROVIDER THIS WEEK. THE PATIENT AGREED. AN APPT WAS SET UP FOR 04/23/20 AT 1045 TO SEE KELLI  MOSHER PAC FOR THIS ISSUE. THE PT AGREED TO DATE AND TIME.

## 2020-04-22 ENCOUNTER — Inpatient Hospital Stay: Payer: PRIVATE HEALTH INSURANCE

## 2020-04-23 ENCOUNTER — Encounter: Payer: Self-pay | Admitting: Hematology and Oncology

## 2020-04-23 ENCOUNTER — Other Ambulatory Visit: Payer: Self-pay | Admitting: Pharmacist

## 2020-04-23 ENCOUNTER — Telehealth: Payer: Self-pay | Admitting: Hematology and Oncology

## 2020-04-23 ENCOUNTER — Other Ambulatory Visit: Payer: Self-pay

## 2020-04-23 ENCOUNTER — Inpatient Hospital Stay (INDEPENDENT_AMBULATORY_CARE_PROVIDER_SITE_OTHER): Payer: PRIVATE HEALTH INSURANCE | Admitting: Hematology and Oncology

## 2020-04-23 ENCOUNTER — Inpatient Hospital Stay: Payer: PRIVATE HEALTH INSURANCE

## 2020-04-23 VITALS — BP 164/82 | HR 110 | Temp 97.8°F | Resp 18 | Ht 69.0 in | Wt 163.5 lb

## 2020-04-23 DIAGNOSIS — C50512 Malignant neoplasm of lower-outer quadrant of left female breast: Secondary | ICD-10-CM | POA: Diagnosis not present

## 2020-04-23 DIAGNOSIS — Z171 Estrogen receptor negative status [ER-]: Secondary | ICD-10-CM

## 2020-04-23 DIAGNOSIS — E876 Hypokalemia: Secondary | ICD-10-CM

## 2020-04-23 DIAGNOSIS — Z5112 Encounter for antineoplastic immunotherapy: Secondary | ICD-10-CM | POA: Diagnosis not present

## 2020-04-23 LAB — HEPATIC FUNCTION PANEL
ALT: 18 (ref 7–35)
AST: 23 (ref 13–35)
Alkaline Phosphatase: 97 (ref 25–125)
Bilirubin, Total: 0.4

## 2020-04-23 LAB — COMPREHENSIVE METABOLIC PANEL
Albumin: 4.1 (ref 3.5–5.0)
Calcium: 9.3 (ref 8.7–10.7)

## 2020-04-23 LAB — BASIC METABOLIC PANEL
BUN: 15 (ref 4–21)
CO2: 29 — AB (ref 13–22)
Chloride: 103 (ref 99–108)
Creatinine: 0.7 (ref 0.5–1.1)
Glucose: 128
Potassium: 2.9 — AB (ref 3.4–5.3)
Sodium: 140 (ref 137–147)

## 2020-04-23 LAB — CBC
MCV: 92 (ref 81–99)
RBC: 3.76 — AB (ref 3.87–5.11)

## 2020-04-23 LAB — MAGNESIUM: Magnesium: 1.7 (ref 1.6–2.3)

## 2020-04-23 LAB — CBC AND DIFFERENTIAL
HCT: 35 — AB (ref 36–46)
Hemoglobin: 11.5 — AB (ref 12.0–16.0)
Neutrophils Absolute: 7.16
Platelets: 222 (ref 150–399)
WBC: 9.3

## 2020-04-23 MED ORDER — SODIUM CHLORIDE 0.9% FLUSH
10.0000 mL | Freq: Once | INTRAVENOUS | Status: AC | PRN
  Administered 2020-04-23: 10 mL
  Filled 2020-04-23: qty 10

## 2020-04-23 MED ORDER — SODIUM CHLORIDE 0.9 % IV SOLN
Freq: Once | INTRAVENOUS | Status: AC
  Filled 2020-04-23: qty 250

## 2020-04-23 MED ORDER — HEPARIN SOD (PORK) LOCK FLUSH 100 UNIT/ML IV SOLN
500.0000 [IU] | Freq: Once | INTRAVENOUS | Status: AC | PRN
  Administered 2020-04-23: 500 [IU]
  Filled 2020-04-23: qty 5

## 2020-04-23 MED ORDER — POTASSIUM CHLORIDE 10 MEQ/100ML IV SOLN
10.0000 meq | INTRAVENOUS | Status: AC
  Administered 2020-04-23 (×3): 10 meq via INTRAVENOUS

## 2020-04-23 NOTE — Telephone Encounter (Signed)
Per 3/24 los next appt scheduled and given to patient 

## 2020-04-23 NOTE — Progress Notes (Signed)
[facility]  [address] [place],  [place]  [address] [phone]  Clinic Day:  04/23/2020  Referring physician: Carolynne Edouard, MD   CHIEF COMPLAINT:  CC:  Stage IB HER2 receptor positive breast cancer  Current Treatment:  Docetaxel/carboplatin/trastuzumab/pertuzumab every 3 weeks   HISTORY OF PRESENT ILLNESS:  Brittney Burgess is a 60 y.o. female with stage IB (T1c N0 M0) HER2 receptor positiveleft breastcancer diagnosed in September 2021. Screening mammogram in August which revealed a mass with calcifications, a second asymmetry and possible dilated ducts within the left breast. Right breast was negative. Diagnostic unilateral left mammogram and left breast ultrasound confirmed a 1.6 cm elongated mass containing pleomorphic calcifications at 7 o'clock, a 5 mm hypoechoic indeterminate mass containing 2 benign-appearing calcification at 2 o'clock, and several retroareolar ecstatic ducts containing internal debris or possible tissue. Ultrasound guided biopsy in September of the mass at 5 o'clock revealed grade 3, invasive ductal carcinoma, grade 3.  Estrogen and progesterone receptors were negative with HER2 positive.  Ki67 was 50%. 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 Biopsy of the mass at 2 o'clock was consistent with fibrocystic changes, including sclerosing adenosis with calcifications. No atypia, in situ or invasive malignancy. Biopsy at 11:30 o'clock revealed a fragmented intraductal papilloma and dilated duct. No atypia, in situ or invasive malignancy identified.  She was treated with lumpectomy and sentinel lymph node biopsy in November. Surgical pathology from this procedure revealed a 1.2 cm, grade 3, invasive ductal carcinoma with high grade ductal carcinoma in situ. One sentinel lymph node was negative for malignancy.Margins were negative.  She also had placement of a port at that time.  She had a  hysterectomy and bilateral salpingo oophorectomy in 2004 due to menorrhagia. She is unsure if she was given hormone replacement.  She has baseline neuropathy of her feet secondary to diabetes.  She started adjuvant chemotherapy/HER2 targeted therapy with docetaxel/carboplatin/trastuzumab/pertuzumab (TCHP) in December. She was placed on gabapentin 300 mg 3 times daily in February for peripheral neuropathy of the bilateral feet and hands.  INTERVAL HISTORY:  Brittney Burgess is here today for repeat clinical assessment prior to a 5th cycle of TCHP. Unfortunately, she delayed her appointments a week as she has had worsening neuropathy with difficulty walking.  She is in a wheelchair today and has been using a walker and a cane to ambulate at home. She has mainly numbness and weakness, but also some pain.  She states it is hard to tell if the gabapentin is helping.  She also ran out of her potassium supplement.  She denies any changes in her breasts. She denies fevers or chills. Her appetite is good. Her weight has been stable.  REVIEW OF SYSTEMS:  Review of Systems  Constitutional: Negative for appetite change, chills, fatigue, fever and unexpected weight change.  HENT:   Negative for lump/mass, mouth sores and sore throat.   Respiratory: Negative for cough and shortness of breath.   Cardiovascular: Negative for chest pain and leg swelling.  Gastrointestinal: Negative for abdominal pain, constipation, diarrhea, nausea and vomiting.  Endocrine: Negative for hot flashes.  Genitourinary: Negative for difficulty urinating, dysuria, frequency and hematuria.   Musculoskeletal: Positive for arthralgias (Bilateral leg pain). Negative for back pain and myalgias.  Skin: Negative for rash.  Neurological: Positive for extremity weakness (Bilateral lower extremities) and numbness (Bilateral lower extremities and hands). Negative for dizziness, headaches and light-headedness.  Hematological: Negative for adenopathy. Does not  bruise/bleed easily.  Psychiatric/Behavioral: Negative for depression and sleep disturbance. The patient is not nervous/anxious.     VITALS:  Blood pressure 137/70, pulse (!) 109, temperature 97.9 F (36.6 C), temperature source Oral, resp. rate 20, height '5\' 9"'  (1.753 m), weight 163 lb 8 oz (74.2 kg), SpO2 98 %.  Wt Readings from Last 3 Encounters:  04/23/20 163 lb 8 oz (74.2 kg)  04/03/20 156 lb 4 oz (70.9 kg)  04/01/20 164 lb 12 oz (74.7 kg)    Body mass index is 24.14 kg/m.  Performance status (ECOG): 3 - Symptomatic, >50% confined to bed  PHYSICAL EXAM:  Physical Exam Vitals and nursing note reviewed.  Constitutional:      General: She is not in acute distress.    Appearance: Normal appearance.  HENT:     Head: Normocephalic and atraumatic.     Mouth/Throat:     Mouth: Mucous membranes are moist.     Pharynx: Oropharynx is clear. No oropharyngeal exudate or posterior oropharyngeal erythema.  Eyes:     General: No scleral icterus.    Extraocular Movements: Extraocular movements intact.     Conjunctiva/sclera: Conjunctivae normal.     Pupils: Pupils are equal, round, and reactive to light.  Cardiovascular:     Rate and Rhythm: Normal rate and regular rhythm.     Heart sounds: Normal heart sounds. No murmur heard. No friction rub. No gallop.   Pulmonary:     Effort: Pulmonary effort is normal.     Breath sounds: Normal breath sounds. No rhonchi or rales.  Chest:  Breasts:     Right: Normal. No axillary adenopathy or supraclavicular adenopathy.     Left: Normal. No axillary adenopathy or supraclavicular adenopathy.    Abdominal:     General: There is no distension.     Palpations: Abdomen is soft. There is no hepatomegaly, splenomegaly or mass.     Tenderness: There is no abdominal tenderness.  Musculoskeletal:        General: Normal range of motion.     Cervical back: Normal range of motion and neck supple. No tenderness.     Right lower leg: No edema.     Left  lower leg: No edema.  Lymphadenopathy:     Cervical: No cervical adenopathy.     Upper Body:     Right upper body: No supraclavicular or axillary adenopathy.     Left upper body: No supraclavicular or axillary adenopathy.     Lower Body: No right inguinal adenopathy. No left inguinal adenopathy.  Skin:    General: Skin is warm and dry.     Coloration: Skin is not jaundiced.     Findings: No rash.  Neurological:     Mental Status: She is alert and oriented to person, place, and time.     Cranial Nerves: No cranial nerve deficit.     Sensory: Sensory deficit (Bilateral lower extremities) present.     Motor: Weakness present. No tremor, atrophy or abnormal muscle tone.     Comments: Motor strength is 4+/5 in the distal bilateral lower extremities, 5/5 in the proximal bilateral lower extremities and 5/5 the bilateral upper extremities. There is generally decreased sensation in the bilateral lower extremities to the knees.  Psychiatric:        Mood and Affect: Mood normal.        Behavior: Behavior normal.        Thought Content: Thought content normal.    LABS:   CBC Latest Ref Rng &  Units 04/23/2020 03/27/2020 03/06/2020  WBC - 9.3 10.5 10.2  Hemoglobin 12.0 - 16.0 11.5(A) 11.7(A) 11.4(A)  Hematocrit 36 - 46 35(A) 35(A) 34(A)  Platelets 150 - 399 222 167 206   CMP Latest Ref Rng & Units 04/23/2020 03/27/2020 03/06/2020  BUN 4 - '21 15 10 10  ' Creatinine 0.5 - 1.1 0.7 0.6 0.7  Sodium 137 - 147 140 142 142  Potassium 3.4 - 5.3 2.9(A) 3.1(A) 4.0  Chloride 99 - 108 103 107 109(A)  CO2 13 - 22 29(A) 28(A) 28(A)  Calcium 8.7 - 10.7 9.3 9.2 9.3  Alkaline Phos 25 - 125 97 106 99  AST 13 - 35 23 64(A) 20  ALT 7 - 35 18 33 12     No results found for: CEA1 / No results found for: CEA1 No results found for: PSA1 No results found for: GJF595 No results found for: CAN125  No results found for: TOTALPROTELP, ALBUMINELP, A1GS, A2GS, BETS, BETA2SER, GAMS, MSPIKE, SPEI No results found for: TIBC,  FERRITIN, IRONPCTSAT No results found for: LDH  STUDIES:  No results found.    HISTORY:   Past Medical History:  Diagnosis Date  . Breast cancer ([facility])   . Diabetes mellitus without complication ([place] [facility])   . Heart disease   . 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 Hypertension     Past Surgical History:  Procedure Laterality Date  . ABDOMINAL HYSTERECTOMY    . BIOPSY THYROID  04/2019   2.9 cm mass, benign  . HYSTERECTOMY ABDOMINAL WITH SALPINGO-OOPHORECTOMY Bilateral 2004   due to menorrhagia  . MYOMECTOMY  1995   x6    Family History  Problem Relation Age of Onset  . Cancer Mother   . Cancer Maternal Aunt     Social History:  reports that she has been smoking cigarettes. She has a 32.00 pack-year smoking history. She has never used smokeless tobacco. She reports that she does not drink alcohol and does not use drugs.The patient is alone today.  Allergies:  Allergies  Allergen Reactions  . Metformin And Related Other (See Comments)    Twitching, nausea    Current Medications: Current Outpatient Medications  Medication Sig Dispense Refill  . amLODipine (NORVASC) 5 MG tablet Take 1 tablet (5 mg total) by mouth daily. 30 tablet 0  . dexamethasone (DECADRON) 4 MG tablet TAKE 2 TABLETS BY MOUTH TWICE A DAY THE DAY BEFORE CHEMO. AND FOR 2 DAYS AFTER 60 tablet 0  . gabapentin (NEURONTIN) 300 MG capsule Take 1 capsule (300 mg total) by mouth 3 (three) times daily. 90 capsule 0  . hydrochlorothiazide (HYDRODIURIL) 25 MG tablet Take 1 tablet (25 mg total) by mouth daily. 30 tablet 0  . insulin glargine (LANTUS) 100 UNIT/ML Solostar Pen Inject into the skin. 36-41 units    . KLOR-CON M20 20 MEQ tablet TAKE 1 TABLET BY MOUTH TWICE A DAY 60 tablet 2  . losartan (COZAAR) 50 MG tablet Take 1 tablet (50 mg total) by mouth daily. 30 tablet 1  . nicotine polacrilex (NICORETTE) 2 MG gum Chew 1 piece in mouth every two hours as directed every 1-2 hours when you have urge to smoke, max 24  pieces per day    . omeprazole (PRILOSEC) 40 MG capsule Take 1 capsule (40 mg total) by mouth daily. 60 capsule 3  . ondansetron (ZOFRAN) 4 MG tablet Take 1 tablet (4 mg total) by mouth every 4 (four) hours as needed for nausea. 90 tablet 3  .  ondansetron (ZOFRAN) 4 MG tablet Take 1 tablet (4 mg total) by mouth every 4 (four) hours as needed for nausea. 90 tablet 3  . oxyCODONE (OXY IR/ROXICODONE) 5 MG immediate release tablet SMARTSIG:1 By Mouth 4-5 Times Daily    . prochlorperazine (COMPAZINE) 10 MG tablet Take 1 tablet (10 mg total) by mouth every 6 (six) hours as needed for nausea or vomiting. 90 tablet 3   No current facility-administered medications for this visit.     ASSESSMENT & PLAN:   Assessment:  1. 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   She is receiving adjuvant TCHP IV every 3 weeks for 6 cycles, with plans to continue with maintenance Herceptin/Perjeta for a total of 1 year.  Due to severe lower extremity neuropathy, we will discontinue docetaxel at this time  2.  Severe worsening neuropathy with mainly numbness and weakness. We therefore recommend discontinuing docetaxel at this time.  I will plan to refer her to physical therapy at her next visit if she is feeling better.  3. Tobacco abuse. We discussed the importance of smoking cessation. She has a prescription for Nicorette gum.  4. Hypokalemia.  I will refer her prescription for potassium chloride 20 mEq twice daily and give her 30 mEq IV of potassium chloride today.  Plan:    We will discontinue docetaxel due to severe neuropathy.  I will get her to physical therapy as soon as she feels able.  We will give her IV potassium today and have her resume her oral potassium supplement.  We will proceed with a 5th cycle of chemotherapy/HER2 targeted therapy next week, which will now consist of carboplatin/trastuzumab/pertuzumab.  The patient understands the plans  discussed today and is in agreement with them.  She knows to contact our office if she develops concerns prior to her next appointment.   I provided 30 of face-to-face time during this this encounter and > 50% was spent counseling as documented under my assessment and plan.      Marvia Pickles, PA-C

## 2020-04-24 ENCOUNTER — Inpatient Hospital Stay: Payer: PRIVATE HEALTH INSURANCE

## 2020-04-24 ENCOUNTER — Other Ambulatory Visit: Payer: Self-pay | Admitting: Hematology and Oncology

## 2020-04-28 ENCOUNTER — Other Ambulatory Visit: Payer: Self-pay

## 2020-04-28 ENCOUNTER — Ambulatory Visit: Payer: Self-pay | Admitting: Hematology and Oncology

## 2020-04-29 ENCOUNTER — Other Ambulatory Visit: Payer: Self-pay | Admitting: Pharmacist

## 2020-04-29 ENCOUNTER — Inpatient Hospital Stay: Payer: PRIVATE HEALTH INSURANCE

## 2020-04-29 ENCOUNTER — Other Ambulatory Visit: Payer: Self-pay

## 2020-04-29 VITALS — BP 163/81 | HR 110 | Temp 97.7°F | Resp 18 | Ht 69.0 in | Wt 158.8 lb

## 2020-04-29 DIAGNOSIS — Z5112 Encounter for antineoplastic immunotherapy: Secondary | ICD-10-CM | POA: Diagnosis not present

## 2020-04-29 DIAGNOSIS — Z171 Estrogen receptor negative status [ER-]: Secondary | ICD-10-CM

## 2020-04-29 DIAGNOSIS — C50512 Malignant neoplasm of lower-outer quadrant of left female breast: Secondary | ICD-10-CM

## 2020-04-29 MED ORDER — SODIUM CHLORIDE 0.9 % IV SOLN
420.0000 mg | Freq: Once | INTRAVENOUS | Status: AC
  Administered 2020-04-29: 420 mg via INTRAVENOUS
  Filled 2020-04-29: qty 14

## 2020-04-29 MED ORDER — SODIUM CHLORIDE 0.9 % IV SOLN
10.0000 mg | Freq: Once | INTRAVENOUS | Status: AC
  Administered 2020-04-29: 10 mg via INTRAVENOUS
  Filled 2020-04-29 (×2): qty 1

## 2020-04-29 MED ORDER — HEPARIN SOD (PORK) LOCK FLUSH 100 UNIT/ML IV SOLN
500.0000 [IU] | Freq: Once | INTRAVENOUS | Status: AC | PRN
  Administered 2020-04-29: 500 [IU]
  Filled 2020-04-29: qty 5

## 2020-04-29 MED ORDER — SODIUM CHLORIDE 0.9 % IV SOLN
Freq: Once | INTRAVENOUS | Status: AC
  Filled 2020-04-29: qty 250

## 2020-04-29 MED ORDER — PALONOSETRON HCL INJECTION 0.25 MG/5ML
INTRAVENOUS | Status: AC
  Filled 2020-04-29: qty 5

## 2020-04-29 MED ORDER — ACETAMINOPHEN 325 MG PO TABS
ORAL_TABLET | ORAL | Status: AC
  Filled 2020-04-29: qty 2

## 2020-04-29 MED ORDER — SODIUM CHLORIDE 0.9 % IV SOLN
150.0000 mg | Freq: Once | INTRAVENOUS | Status: AC
  Administered 2020-04-29: 150 mg via INTRAVENOUS
  Filled 2020-04-29 (×2): qty 5

## 2020-04-29 MED ORDER — DIPHENHYDRAMINE HCL 50 MG/ML IJ SOLN
INTRAMUSCULAR | Status: AC
  Filled 2020-04-29: qty 1

## 2020-04-29 MED ORDER — TRASTUZUMAB-ANNS CHEMO 150 MG IV SOLR
6.0000 mg/kg | Freq: Once | INTRAVENOUS | Status: AC
  Administered 2020-04-29: 462 mg via INTRAVENOUS
  Filled 2020-04-29: qty 22

## 2020-04-29 MED ORDER — DIPHENHYDRAMINE HCL 50 MG/ML IJ SOLN
25.0000 mg | Freq: Once | INTRAMUSCULAR | Status: AC
  Administered 2020-04-29: 25 mg via INTRAVENOUS

## 2020-04-29 MED ORDER — SODIUM CHLORIDE 0.9 % IV SOLN
670.8000 mg | Freq: Once | INTRAVENOUS | Status: AC
  Administered 2020-04-29: 670 mg via INTRAVENOUS
  Filled 2020-04-29: qty 67

## 2020-04-29 MED ORDER — PALONOSETRON HCL INJECTION 0.25 MG/5ML
0.2500 mg | Freq: Once | INTRAVENOUS | Status: AC
  Administered 2020-04-29: 0.25 mg via INTRAVENOUS

## 2020-04-29 MED ORDER — ACETAMINOPHEN 325 MG PO TABS
650.0000 mg | ORAL_TABLET | Freq: Once | ORAL | Status: AC
  Administered 2020-04-29: 650 mg via ORAL

## 2020-04-29 NOTE — Progress Notes (Signed)
1328: PT STABLE AT TIME OF DISCHARGE

## 2020-04-29 NOTE — Patient Instructions (Signed)
[facility] Discharge Instructions for Patients Receiving Chemotherapy  Today you received the following chemotherapy agents Carboplatin,Trastuzumab,Pertuzumab  To help prevent nausea and vomiting after your treatment, we encourage you to take your nausea medication as directed.   If you develop nausea and vomiting that is not controlled by your nausea medication, call the clinic.   BELOW ARE SYMPTOMS THAT SHOULD BE REPORTED IMMEDIATELY:  *FEVER GREATER THAN 100.5 F  *CHILLS WITH OR WITHOUT FEVER  NAUSEA AND VOMITING THAT IS NOT CONTROLLED WITH YOUR NAUSEA MEDICATION  *UNUSUAL SHORTNESS OF BREATH  *UNUSUAL BRUISING OR BLEEDING  TENDERNESS IN MOUTH AND THROAT WITH OR WITHOUT PRESENCE OF ULCERS  *URINARY PROBLEMS  *BOWEL PROBLEMS  UNUSUAL RASH Items with * indicate a potential emergency and should be followed up as soon as possible.  Feel free to call the clinic should you have any questions or concerns at The clinic phone number is [phone].  Please show the [facility] at check-in to the Emergency Department and triage nurse.

## 2020-04-30 ENCOUNTER — Inpatient Hospital Stay: Payer: PRIVATE HEALTH INSURANCE | Admitting: Hematology and Oncology

## 2020-04-30 ENCOUNTER — Inpatient Hospital Stay: Payer: PRIVATE HEALTH INSURANCE

## 2020-05-01 ENCOUNTER — Inpatient Hospital Stay: Payer: PRIVATE HEALTH INSURANCE

## 2020-05-02 ENCOUNTER — Other Ambulatory Visit: Payer: Self-pay | Admitting: Hematology and Oncology

## 2020-05-04 ENCOUNTER — Other Ambulatory Visit: Payer: Self-pay

## 2020-05-09 ENCOUNTER — Other Ambulatory Visit: Payer: Self-pay | Admitting: Hematology and Oncology

## 2020-05-13 NOTE — Progress Notes (Signed)
[facility]  [address] [place],  [place]  [address] [phone]  Clinic Day:  05/14/2020  Referring physician: Carolynne Edouard, MD   CHIEF COMPLAINT:  CC:  Stage IB HER2 receptor positive breast cancer  Current Treatment:  Docetaxel/carboplatin/trastuzumab/pertuzumab every 3 weeks   HISTORY OF PRESENT ILLNESS:  Brittney Burgess is a 60 y.o. female with stage IB (T1c N0 M0) HER2 receptor positiveleft breastcancer diagnosed in September 2021. Screening mammogram in August which revealed a mass with calcifications, a second asymmetry and possible dilated ducts within the left breast. Right breast was negative. Diagnostic unilateral left mammogram and left breast ultrasound confirmed a 1.6 cm elongated mass containing pleomorphic calcifications at 7 o'clock, a 5 mm hypoechoic indeterminate mass containing 2 benign-appearing calcification at 2 o'clock, and several retroareolar ecstatic ducts containing internal debris or possible tissue. Ultrasound guided biopsy in September of the mass at 5 o'clock revealed grade 3, invasive ductal carcinoma, grade 3.  Estrogen and progesterone receptors were negative with HER2 positive.  Ki67 was 50%. 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 Biopsy of the mass at 2 o'clock was consistent with fibrocystic changes, including sclerosing adenosis with calcifications. No atypia, in situ or invasive malignancy. Biopsy at 11:30 o'clock revealed a fragmented intraductal papilloma and dilated duct. No atypia, in situ or invasive malignancy identified.  She was treated with lumpectomy and sentinel lymph node biopsy in November. Surgical pathology from this procedure revealed a 1.2 cm, grade 3, invasive ductal carcinoma with high grade ductal carcinoma in situ. One sentinel lymph node was negative for malignancy.Margins were negative.  She also had placement of a port at that time.  She had a  hysterectomy and bilateral salpingo oophorectomy in 2004 due to menorrhagia. She is unsure if she was given hormone replacement.  She has baseline neuropathy of her feet secondary to diabetes.  She started adjuvant chemotherapy/HER2 targeted therapy with docetaxel/carboplatin/trastuzumab/pertuzumab (TCHP) in December. She was placed on gabapentin 300 mg 3 times daily in February for peripheral neuropathy of the bilateral feet and hands.  When she was seen prior to a 5th cycle of TCHP, she had worsening neuropathy with difficulty ambulating and was in a wheelchair. Due to this, we discontinued docetaxel.  INTERVAL HISTORY:  Brittney Burgess is here today for repeat clinical assessment prior to a 6th cycle of chemotherapy, which now consists of carboplatin / trastuzumab /pertuzumab.She continues to report numbness of the bilateral feet and legs, with some improvement in the weakness. She states she stepped on a briar in the yard yesterday and did not even feel it.  She has intermittent pain, but not severe.  She now bilateral pedal edema which is not severe  She remained in a wheelchair today, but has been using a cane to ambulate at home. She states she saw Dr. Lilia Pro this week and her breast examination was normal.  She denies any changes in her breasts. She denies fevers or chills. Her appetite is good. Her weight has been stable. She states she is not taking potassium regularly as it is such a big tablet and seems to upset her stomach.  I advised her to try taking it every other day and increasing potassium in her diet.  If needed, we could give her a potassium chloride 10 mEq tablet which is smaller. She states she has not been able to get a wheelchair or bedside commode approved by her insurance.  I will have her meet with Evelena Peat  regarding this today.  REVIEW OF SYSTEMS:  Review of Systems  Constitutional: Negative for appetite change, chills, fatigue, fever and unexpected weight change.  HENT:   Negative for  lump/mass, mouth sores and sore throat.   Respiratory: Negative for cough and shortness of breath.   Cardiovascular: Positive for leg swelling (Bilateral feet). Negative for chest pain.  Gastrointestinal: Negative for abdominal pain, constipation, diarrhea, nausea and vomiting.  Endocrine: Negative for hot flashes.  Genitourinary: Negative for difficulty urinating, dysuria, frequency and hematuria.   Musculoskeletal: Negative for arthralgias, back pain and myalgias.  Skin: Negative for rash.  Neurological: Positive for extremity weakness (Bilateral lower extremities) and numbness (Bilateral lower extremities). Negative for dizziness and headaches.  Hematological: Negative for adenopathy. Does not bruise/bleed easily.  Psychiatric/Behavioral: Negative for depression and sleep disturbance. The patient is not nervous/anxious.     VITALS:  Blood pressure (!) 143/75, pulse 96, temperature 98.1 F (36.7 C), temperature source Oral, resp. rate 18, height _0  (1.753 m), weight 161 lb (73 kg), SpO2 99 %.  Wt Readings from Last 3 Encounters:  05/14/20 161 lb (73 kg)  04/29/20 158 lb 12 oz (72 kg)  04/23/20 163 lb 8 oz (74.2 kg)    Body mass index is 23.78 kg/m.  Performance status (ECOG): 3 - Symptomatic, >50% confined to bed  PHYSICAL EXAM:  Physical Exam Vitals and nursing note reviewed.  Constitutional:      General: She is not in acute distress.    Appearance: Normal appearance.  HENT:     Head: Normocephalic and atraumatic.     Mouth/Throat:     Mouth: Mucous membranes are moist.     Pharynx: Oropharynx is clear. No oropharyngeal exudate or posterior oropharyngeal erythema.  Eyes:     General: No scleral icterus.    Extraocular Movements: Extraocular movements intact.     Conjunctiva/sclera: Conjunctivae normal.     Pupils: Pupils are equal, round, and reactive to light.  Cardiovascular:     Rate and Rhythm: Normal rate and regular rhythm.     Heart sounds: Normal heart  sounds. No murmur heard. No friction rub. No gallop.   Pulmonary:     Effort: Pulmonary effort is normal.     Breath sounds: Normal breath sounds. No wheezing, rhonchi or rales.  Chest:  Breasts:     Right: No axillary adenopathy or supraclavicular adenopathy.     Left: No axillary adenopathy or supraclavicular adenopathy.      Comments:   Breast exam is deferred Abdominal:     General: There is no distension.     Palpations: Abdomen is soft. There is no hepatomegaly, splenomegaly or mass.     Tenderness: There is no abdominal tenderness.  Musculoskeletal:        General: Normal range of motion.     Cervical back: Normal range of motion and neck supple. No tenderness.     Right lower leg: Edema (1+ pedal edema) present.     Left lower leg: Edema (1+ pedal edema) present.  Lymphadenopathy:     Cervical: No cervical adenopathy.     Upper Body:     Right upper body: No supraclavicular or axillary adenopathy.     Left upper body: No supraclavicular or axillary adenopathy.     Lower Body: No right inguinal adenopathy. No left inguinal adenopathy.  Skin:    General: Skin is warm and dry.     Coloration: Skin is not jaundiced.     Findings: No  rash.  Neurological:     Mental Status: She is alert and oriented to person, place, and time.     Cranial Nerves: Cranial nerves are intact. No cranial nerve deficit.     Sensory: Sensory deficit (Bilateral lower extremities) present.     Motor: Motor function is intact.     Comments:   Persistent numbness of the bilateral lower extremities with resolution of the weakness  Psychiatric:        Mood and Affect: Mood normal.        Behavior: Behavior normal.        Thought Content: Thought content normal.    LABS:   CBC Latest Ref Rng & Units 05/14/2020 04/23/2020 03/27/2020  WBC - 3.6 9.3 10.5  Hemoglobin 12.0 - 16.0 11.3(A) 11.5(A) 11.7(A)  Hematocrit 36 - 46 34(A) 35(A) 35(A)  Platelets 150 - 399 140(A) 222 167   CMP Latest Ref Rng &  Units 05/14/2020 04/23/2020 03/27/2020  BUN 4 - _0 Creatinine 0.5 - 1.1 0.7 0.7 0.6  Sodium 137 - 147 139 140 142  Potassium 3.4 - 5.3 3.2(A) 2.9(A) 3.1(A)  Chloride 99 - 108 106 103 107  CO2 13 - 22 25(A) 29(A) 28(A)  Calcium 8.7 - 10.7 9.4 9.3 9.2  Alkaline Phos 25 - 125 112 97 106  AST 13 - 35 18 23 64(A)  ALT 7 - 35 16 18 33     No results found for: CEA1 / No results found for: CEA1 No results found for: PSA1 No results found for: RFF638 No results found for: CAN125  No results found for: TOTALPROTELP, ALBUMINELP, A1GS, A2GS, BETS, BETA2SER, GAMS, MSPIKE, SPEI No results found for: TIBC, FERRITIN, IRONPCTSAT No results found for: LDH  STUDIES:  No results found.    HISTORY:   Past Medical History:  Diagnosis Date  . Breast cancer ([place])   . Diabetes mellitus without complication ([place])   . Heart disease   . 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 Hypertension     Past Surgical History:  Procedure Laterality Date  . ABDOMINAL HYSTERECTOMY    . BIOPSY THYROID  04/2019   2.9 cm mass, benign  . BREAST LUMPECTOMY Left 12/25/2019  . HYSTERECTOMY ABDOMINAL WITH SALPINGO-OOPHORECTOMY Bilateral 2004   due to menorrhagia  . MYOMECTOMY  1995   x6    Family History  Problem Relation Age of Onset  . Cancer Mother   . Cancer Maternal Aunt     Social History:  reports that she has been smoking cigarettes. She has a 32.00 pack-year smoking history. She has never used smokeless tobacco. She reports that she does not drink alcohol and does not use drugs.The patient is alone today.  Allergies:  Allergies  Allergen Reactions  . Metformin And Related Other (See Comments)    Twitching, nausea    Current Medications: Current Outpatient Medications  Medication Sig Dispense Refill  . amLODipine (NORVASC) 5 MG tablet Take 1 tablet (5 mg total) by mouth daily. 30 tablet 0  . COVID-19 mRNA vaccine, Moderna, 100 MCG/0.5ML injection USE AS DIRECTED .25 mL 0  .  dexamethasone (DECADRON) 4 MG tablet TAKE 2 TABLETS BY MOUTH TWICE A DAY THE DAY BEFORE CHEMO. AND FOR 2 DAYS AFTER 60 tablet 0  . gabapentin (NEURONTIN) 300 MG capsule Take 1 capsule (300 mg total) by mouth 3 (three) times daily. 90 capsule 0  . hydrochlorothiazide (HYDRODIURIL) 25 MG tablet TAKE 1 TABLET (25 MG TOTAL)  BY MOUTH DAILY. 30 tablet 0  . insulin glargine (LANTUS) 100 UNIT/ML Solostar Pen Inject into the skin. 36-41 units    . KLOR-CON M20 20 MEQ tablet TAKE 1 TABLET BY MOUTH TWICE A DAY 60 tablet 2  . losartan (COZAAR) 50 MG tablet Take 1 tablet (50 mg total) by mouth daily. 30 tablet 1  . nicotine polacrilex (NICORETTE) 2 MG gum Chew 1 piece in mouth every two hours as directed every 1-2 hours when you have urge to smoke, max 24 pieces per day    . omeprazole (PRILOSEC) 40 MG capsule Take 1 capsule (40 mg total) by mouth daily. 60 capsule 3  . ondansetron (ZOFRAN) 4 MG tablet Take 1 tablet (4 mg total) by mouth every 4 (four) hours as needed for nausea. 90 tablet 3  . ondansetron (ZOFRAN) 4 MG tablet Take 1 tablet (4 mg total) by mouth every 4 (four) hours as needed for nausea. 90 tablet 3  . oxyCODONE (OXY IR/ROXICODONE) 5 MG immediate release tablet SMARTSIG:1 By Mouth 4-5 Times Daily    . prochlorperazine (COMPAZINE) 10 MG tablet Take 1 tablet (10 mg total) by mouth every 6 (six) hours as needed for nausea or vomiting. 90 tablet 3   No current facility-administered medications for this visit.     ASSESSMENT & PLAN:   Assessment:  1. 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   She will proceed with a 6th and final cycle of adjuvant chemotherapy which now consists of carboplatin/trastuzumab/pertuzumab.  We will then continue with maintenance tastuzumab/pertuzumab every 3 weeks for a total of 1 year of HER2 targeted therapy.  2.  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 She has  persistent numbness, but improvement in her weakness in only occasional pain. Due to the numbness, she is at increased risk for falls.  I therefore still recommend a wheelchair for outings.  She still does not feel up to starting physical therapy.  We will plan to refer her to PT at her next visit if she is feeling better.  3. Tobacco abuse. We discussed the importance of smoking cessation. She has a prescription for Nicorette gum.  4. Recurrent hypokalemia, due to noncompliance with potassium supplement.  She will try taking this every other day and increasing potassium in her diet.  5. Mild bilateral pedal edema. I do not recommended diuretic at this time.  The patient will contact us if this worsens.  Plan:   We will proceed with a 6th and final cycle of chemotherapy/HER2 targeted therapy next week, which now consists of carboplatin/trastuzumab/pertuzumab.  We will plan to see her back in 3 weeks with a CBC and comprehensive metabolic panel prior to starting maintenance trastuzumab / pertuzumab every 3 weeks.  The patient understands the plans discussed today and is in agreement with them.  She knows to contact our office if she develops concerns prior to her next appointment.       Marvia Pickles, PA-C

## 2020-05-14 ENCOUNTER — Other Ambulatory Visit: Payer: Self-pay

## 2020-05-14 ENCOUNTER — Inpatient Hospital Stay: Payer: PRIVATE HEALTH INSURANCE | Attending: Hematology and Oncology

## 2020-05-14 ENCOUNTER — Encounter: Payer: Self-pay | Admitting: Hematology and Oncology

## 2020-05-14 ENCOUNTER — Inpatient Hospital Stay (INDEPENDENT_AMBULATORY_CARE_PROVIDER_SITE_OTHER): Payer: PRIVATE HEALTH INSURANCE | Admitting: Hematology and Oncology

## 2020-05-14 VITALS — BP 143/75 | HR 96 | Temp 98.1°F | Resp 18 | Ht 69.0 in | Wt 161.0 lb

## 2020-05-14 DIAGNOSIS — C50512 Malignant neoplasm of lower-outer quadrant of left female breast: Secondary | ICD-10-CM

## 2020-05-14 DIAGNOSIS — Z171 Estrogen receptor negative status [ER-]: Secondary | ICD-10-CM

## 2020-05-14 DIAGNOSIS — Z5112 Encounter for antineoplastic immunotherapy: Secondary | ICD-10-CM | POA: Insufficient documentation

## 2020-05-14 DIAGNOSIS — Z5111 Encounter for antineoplastic chemotherapy: Secondary | ICD-10-CM | POA: Insufficient documentation

## 2020-05-14 LAB — BASIC METABOLIC PANEL
BUN: 14 (ref 4–21)
CO2: 25 — AB (ref 13–22)
Chloride: 106 (ref 99–108)
Creatinine: 0.7 (ref 0.5–1.1)
Glucose: 124
Potassium: 3.2 — AB (ref 3.4–5.3)
Sodium: 139 (ref 137–147)

## 2020-05-14 LAB — COMPREHENSIVE METABOLIC PANEL
Albumin: 4.2 (ref 3.5–5.0)
Calcium: 9.4 (ref 8.7–10.7)

## 2020-05-14 LAB — CBC AND DIFFERENTIAL
HCT: 34 — AB (ref 36–46)
Hemoglobin: 11.3 — AB (ref 12.0–16.0)
Neutrophils Absolute: 2.59
Platelets: 140 — AB (ref 150–399)
WBC: 3.6

## 2020-05-14 LAB — HEPATIC FUNCTION PANEL
ALT: 16 (ref 7–35)
AST: 18 (ref 13–35)
Alkaline Phosphatase: 112 (ref 25–125)
Bilirubin, Total: 0.3

## 2020-05-14 LAB — CBC
MCV: 92 (ref 81–99)
RBC: 3.67 — AB (ref 3.87–5.11)

## 2020-05-14 LAB — MAGNESIUM: Magnesium: 1.8 (ref 1.6–2.3)

## 2020-05-14 NOTE — Progress Notes (Signed)
Patient is trying to get a wheelchair, she had provided the Rx to [facility]. I called and spoke with them today, they are needing the office notes. I faxed those in, will call back tomorrow and follow-up.

## 2020-05-20 ENCOUNTER — Inpatient Hospital Stay: Payer: PRIVATE HEALTH INSURANCE

## 2020-05-20 ENCOUNTER — Other Ambulatory Visit: Payer: Self-pay

## 2020-05-20 DIAGNOSIS — C50512 Malignant neoplasm of lower-outer quadrant of left female breast: Secondary | ICD-10-CM

## 2020-05-20 DIAGNOSIS — Z171 Estrogen receptor negative status [ER-]: Secondary | ICD-10-CM

## 2020-05-20 DIAGNOSIS — Z5112 Encounter for antineoplastic immunotherapy: Secondary | ICD-10-CM | POA: Diagnosis present

## 2020-05-20 DIAGNOSIS — Z5111 Encounter for antineoplastic chemotherapy: Secondary | ICD-10-CM | POA: Diagnosis present

## 2020-05-20 MED ORDER — PALONOSETRON HCL INJECTION 0.25 MG/5ML
INTRAVENOUS | Status: AC
  Filled 2020-05-20: qty 5

## 2020-05-20 MED ORDER — HEPARIN SOD (PORK) LOCK FLUSH 100 UNIT/ML IV SOLN
500.0000 [IU] | Freq: Once | INTRAVENOUS | Status: AC | PRN
  Administered 2020-05-20: 500 [IU]
  Filled 2020-05-20: qty 5

## 2020-05-20 MED ORDER — ACETAMINOPHEN 325 MG PO TABS
ORAL_TABLET | ORAL | Status: AC
  Filled 2020-05-20: qty 2

## 2020-05-20 MED ORDER — SODIUM CHLORIDE 0.9 % IV SOLN
150.0000 mg | Freq: Once | INTRAVENOUS | Status: AC
  Administered 2020-05-20: 150 mg via INTRAVENOUS
  Filled 2020-05-20: qty 150

## 2020-05-20 MED ORDER — SODIUM CHLORIDE 0.9 % IV SOLN
420.0000 mg | Freq: Once | INTRAVENOUS | Status: AC
  Administered 2020-05-20: 420 mg via INTRAVENOUS
  Filled 2020-05-20: qty 14

## 2020-05-20 MED ORDER — SODIUM CHLORIDE 0.9% FLUSH
10.0000 mL | INTRAVENOUS | Status: DC | PRN
  Administered 2020-05-20: 10 mL
  Filled 2020-05-20: qty 10

## 2020-05-20 MED ORDER — SODIUM CHLORIDE 0.9 % IV SOLN
Freq: Once | INTRAVENOUS | Status: AC
Start: 2020-05-20 — End: 2020-05-20
  Filled 2020-05-20: qty 250

## 2020-05-20 MED ORDER — DIPHENHYDRAMINE HCL 50 MG/ML IJ SOLN
INTRAMUSCULAR | Status: AC
  Filled 2020-05-20: qty 1

## 2020-05-20 MED ORDER — SODIUM CHLORIDE 0.9 % IV SOLN
6.0000 mg/kg | Freq: Once | INTRAVENOUS | Status: AC
  Administered 2020-05-20: 462 mg via INTRAVENOUS
  Filled 2020-05-20: qty 22

## 2020-05-20 MED ORDER — DIPHENHYDRAMINE HCL 50 MG/ML IJ SOLN
25.0000 mg | Freq: Once | INTRAMUSCULAR | Status: AC
  Administered 2020-05-20: 25 mg via INTRAVENOUS

## 2020-05-20 MED ORDER — SODIUM CHLORIDE 0.9 % IV SOLN
10.0000 mg | Freq: Once | INTRAVENOUS | Status: AC
  Administered 2020-05-20: 10 mg via INTRAVENOUS
  Filled 2020-05-20: qty 10

## 2020-05-20 MED ORDER — ACETAMINOPHEN 325 MG PO TABS
650.0000 mg | ORAL_TABLET | Freq: Once | ORAL | Status: AC
  Administered 2020-05-20: 650 mg via ORAL

## 2020-05-20 MED ORDER — SODIUM CHLORIDE 0.9 % IV SOLN
670.8000 mg | Freq: Once | INTRAVENOUS | Status: AC
  Administered 2020-05-20: 670 mg via INTRAVENOUS
  Filled 2020-05-20: qty 67

## 2020-05-20 MED ORDER — PALONOSETRON HCL INJECTION 0.25 MG/5ML
0.2500 mg | Freq: Once | INTRAVENOUS | Status: AC
  Administered 2020-05-20: 0.25 mg via INTRAVENOUS

## 2020-05-20 NOTE — Progress Notes (Signed)
Called and spoke with patient, Brittney Burgess Patient called her yesterday, her insurance will not cover the cost of her wheelchair. She did end up purchasing one at [facility].

## 2020-05-20 NOTE — Patient Instructions (Signed)
Carboplatin injection What is this medicine? CARBOPLATIN (KAR boe pla tin) is a chemotherapy drug. It targets fast dividing cells, like cancer cells, and causes these cells to die. This medicine is used to treat ovarian cancer and many other cancers. This medicine may be used for other purposes; ask your health care provider or pharmacist if you have questions. COMMON BRAND NAME(S): Paraplatin What should I tell my health care provider before I take this medicine? They need to know if you have any of these conditions:  blood disorders  hearing problems  kidney disease  recent or ongoing radiation therapy  an unusual or allergic reaction to carboplatin, cisplatin, other chemotherapy, other medicines, foods, dyes, or preservatives  pregnant or trying to get pregnant  breast-feeding How should I use this medicine? This drug is usually given as an infusion into a vein. It is administered in a hospital or clinic by a specially trained health care professional. Talk to your pediatrician regarding the use of this medicine in children. Special care may be needed. Overdosage: If you think you have taken too much of this medicine contact a poison control center or emergency room at once. NOTE: This medicine is only for you. Do not share this medicine with others. What if I miss a dose? It is important not to miss a dose. Call your doctor or health care professional if you are unable to keep an appointment. What may interact with this medicine?  medicines for seizures  medicines to increase blood counts like filgrastim, pegfilgrastim, sargramostim  some antibiotics like amikacin, gentamicin, neomycin, streptomycin, tobramycin  vaccines Talk to your doctor or health care professional before taking any of these medicines:  acetaminophen  aspirin  ibuprofen  ketoprofen  naproxen This list may not describe all possible interactions. Give your health care provider a list of all the  medicines, herbs, non-prescription drugs, or dietary supplements you use. Also tell them if you smoke, drink alcohol, or use illegal drugs. Some items may interact with your medicine. What should I watch for while using this medicine? Your condition will be monitored carefully while you are receiving this medicine. You will need important blood work done while you are taking this medicine. This drug may make you feel generally unwell. This is not uncommon, as chemotherapy can affect healthy cells as well as cancer cells. Report any side effects. Continue your course of treatment even though you feel ill unless your doctor tells you to stop. In some cases, you may be given additional medicines to help with side effects. Follow all directions for their use. Call your doctor or health care professional for advice if you get a fever, chills or sore throat, or other symptoms of a cold or flu. Do not treat yourself. This drug decreases your body's ability to fight infections. Try to avoid being around people who are sick. This medicine may increase your risk to bruise or bleed. Call your doctor or health care professional if you notice any unusual bleeding. Be careful brushing and flossing your teeth or using a toothpick because you may get an infection or bleed more easily. If you have any dental work done, tell your dentist you are receiving this medicine. Avoid taking products that contain aspirin, acetaminophen, ibuprofen, naproxen, or ketoprofen unless instructed by your doctor. These medicines may hide a fever. Do not become pregnant while taking this medicine. Women should inform their doctor if they wish to become pregnant or think they might be pregnant. There is a  potential for serious side effects to an unborn child. Talk to your health care professional or pharmacist for more information. Do not breast-feed an infant while taking this medicine. What side effects may I notice from receiving this  medicine? Side effects that you should report to your doctor or health care professional as soon as possible:  allergic reactions like skin rash, itching or hives, swelling of the face, lips, or tongue  signs of infection - fever or chills, cough, sore throat, pain or difficulty passing urine  signs of decreased platelets or bleeding - bruising, pinpoint red spots on the skin, black, tarry stools, nosebleeds  signs of decreased red blood cells - unusually weak or tired, fainting spells, lightheadedness  breathing problems  changes in hearing  changes in vision  chest pain  high blood pressure  low blood counts - This drug may decrease the number of white blood cells, red blood cells and platelets. You may be at increased risk for infections and bleeding.  nausea and vomiting  pain, swelling, redness or irritation at the injection site  pain, tingling, numbness in the hands or feet  problems with balance, talking, walking  trouble passing urine or change in the amount of urine Side effects that usually do not require medical attention (report to your doctor or health care professional if they continue or are bothersome):  hair loss  loss of appetite  metallic taste in the mouth or changes in taste This list may not describe all possible side effects. Call your doctor for medical advice about side effects. You may report side effects to FDA at 1-800-FDA-1088. Where should I keep my medicine? This drug is given in a hospital or clinic and will not be stored at home. NOTE: This sheet is a summary. It may not cover all possible information. If you have questions about this medicine, talk to your doctor, pharmacist, or health care provider.  2021 Elsevier/Gold Standard (2007-04-24 14:38:05) Pertuzumab injection What is this medicine? PERTUZUMAB (per TOOZ ue mab) is a monoclonal antibody. It is used to treat breast cancer. This medicine may be used for other purposes; ask  your health care provider or pharmacist if you have questions. COMMON BRAND NAME(S): PERJETA What should I tell my health care provider before I take this medicine? They need to know if you have any of these conditions:  heart disease  heart failure  high blood pressure  history of irregular heart beat  recent or ongoing radiation therapy  an unusual or allergic reaction to pertuzumab, other medicines, foods, dyes, or preservatives  pregnant or trying to get pregnant  breast-feeding How should I use this medicine? This medicine is for infusion into a vein. It is given by a health care professional in a hospital or clinic setting. Talk to your pediatrician regarding the use of this medicine in children. Special care may be needed. Overdosage: If you think you have taken too much of this medicine contact a poison control center or emergency room at once. NOTE: This medicine is only for you. Do not share this medicine with others. What if I miss a dose? It is important not to miss your dose. Call your doctor or health care professional if you are unable to keep an appointment. What may interact with this medicine? Interactions are not expected. Give your health care provider a list of all the medicines, herbs, non-prescription drugs, or dietary supplements you use. Also tell them if you smoke, drink alcohol, or use illegal  drugs. Some items may interact with your medicine. This list may not describe all possible interactions. Give your health care provider a list of all the medicines, herbs, non-prescription drugs, or dietary supplements you use. Also tell them if you smoke, drink alcohol, or use illegal drugs. Some items may interact with your medicine. What should I watch for while using this medicine? Your condition will be monitored carefully while you are receiving this medicine. Report any side effects. Continue your course of treatment even though you feel ill unless your doctor  tells you to stop. Do not become pregnant while taking this medicine or for 7 months after stopping it. Women should inform their doctor if they wish to become pregnant or think they might be pregnant. Women of child-bearing potential will need to have a negative pregnancy test before starting this medicine. There is a potential for serious side effects to an unborn child. Talk to your health care professional or pharmacist for more information. Do not breast-feed an infant while taking this medicine or for 7 months after stopping it. Women must use effective birth control with this medicine. Call your doctor or health care professional for advice if you get a fever, chills or sore throat, or other symptoms of a cold or flu. Do not treat yourself. Try to avoid being around people who are sick. You may experience fever, chills, and headache during the infusion. Report any side effects during the infusion to your health care professional. What side effects may I notice from receiving this medicine? Side effects that you should report to your doctor or health care professional as soon as possible:  breathing problems  chest pain or palpitations  dizziness  feeling faint or lightheaded  fever or chills  skin rash, itching or hives  sore throat  swelling of the face, lips, or tongue  swelling of the legs or ankles  unusually weak or tired Side effects that usually do not require medical attention (report to your doctor or health care professional if they continue or are bothersome):  diarrhea  hair loss  nausea, vomiting  tiredness This list may not describe all possible side effects. Call your doctor for medical advice about side effects. You may report side effects to FDA at 1-800-FDA-1088. Where should I keep my medicine? This drug is given in a hospital or clinic and will not be stored at home. NOTE: This sheet is a summary. It may not cover all possible information. If you have  questions about this medicine, talk to your doctor, pharmacist, or health care provider.  2021 Else[employer]/Gold Standard (2015-02-19 12:08:50) Trastuzumab injection for infusion What is this medicine? TRASTUZUMAB (tras TOO zoo mab) is a monoclonal antibody. It is used to treat breast cancer and stomach cancer. This medicine may be used for other purposes; ask your health care provider or pharmacist if you have questions. COMMON BRAND NAME(S): Herceptin, Galvin Proffer, Trazimera What should I tell my health care provider before I take this medicine? They need to know if you have any of these conditions:  heart disease  heart failure  lung or breathing disease, like asthma  an unusual or allergic reaction to trastuzumab, benzyl alcohol, or other medications, foods, dyes, or preservatives  pregnant or trying to get pregnant  breast-feeding How should I use this medicine? This drug is given as an infusion into a vein. It is administered in a hospital or clinic by a specially trained health care professional. Talk to your pediatrician  regarding the use of this medicine in children. This medicine is not approved for use in children. Overdosage: If you think you have taken too much of this medicine contact a poison control center or emergency room at once. NOTE: This medicine is only for you. Do not share this medicine with others. What if I miss a dose? It is important not to miss a dose. Call your doctor or health care professional if you are unable to keep an appointment. What may interact with this medicine? This medicine may interact with the following medications:  certain types of chemotherapy, such as daunorubicin, doxorubicin, epirubicin, and idarubicin This list may not describe all possible interactions. Give your health care provider a list of all the medicines, herbs, non-prescription drugs, or dietary supplements you use. Also tell them if you smoke, drink  alcohol, or use illegal drugs. Some items may interact with your medicine. What should I watch for while using this medicine? Visit your doctor for checks on your progress. Report any side effects. Continue your course of treatment even though you feel ill unless your doctor tells you to stop. Call your doctor or health care professional for advice if you get a fever, chills or sore throat, or other symptoms of a cold or flu. Do not treat yourself. Try to avoid being around people who are sick. You may experience fever, chills and shaking during your first infusion. These effects are usually mild and can be treated with other medicines. Report any side effects during the infusion to your health care professional. Fever and chills usually do not happen with later infusions. Do not become pregnant while taking this medicine or for 7 months after stopping it. Women should inform their doctor if they wish to become pregnant or think they might be pregnant. Women of child-bearing potential will need to have a negative pregnancy test before starting this medicine. There is a potential for serious side effects to an unborn child. Talk to your health care professional or pharmacist for more information. Do not breast-feed an infant while taking this medicine or for 7 months after stopping it. Women must use effective birth control with this medicine. What side effects may I notice from receiving this medicine? Side effects that you should report to your doctor or health care professional as soon as possible:  allergic reactions like skin rash, itching or hives, swelling of the face, lips, or tongue  chest pain or palpitations  cough  dizziness  feeling faint or lightheaded, falls  fever  general ill feeling or flu-like symptoms  signs of worsening heart failure like breathing problems; swelling in your legs and feet  unusually weak or tired Side effects that usually do not require medical  attention (report to your doctor or health care professional if they continue or are bothersome):  bone pain  changes in taste  diarrhea  joint pain  nausea/vomiting  weight loss This list may not describe all possible side effects. Call your doctor for medical advice about side effects. You may report side effects to FDA at 1-800-FDA-1088. Where should I keep my medicine? This drug is given in a hospital or clinic and will not be stored at home. NOTE: This sheet is a summary. It may not cover all possible information. If you have questions about this medicine, talk to your doctor, pharmacist, or health care provider.  2021 Elsevier/Gold Standard (2016-01-12 14:37:52)

## 2020-06-01 NOTE — Progress Notes (Signed)
Patient will be loosing her primary insurance 05/31/2020 and will just have [employer]. I sent in applications to [place] for free [place] and [place] for free [place].

## 2020-06-04 ENCOUNTER — Other Ambulatory Visit: Payer: Self-pay

## 2020-06-04 ENCOUNTER — Inpatient Hospital Stay: Payer: PRIVATE HEALTH INSURANCE | Attending: Hematology and Oncology | Admitting: Hematology and Oncology

## 2020-06-04 ENCOUNTER — Telehealth: Payer: Self-pay | Admitting: Hematology and Oncology

## 2020-06-04 ENCOUNTER — Inpatient Hospital Stay: Payer: PRIVATE HEALTH INSURANCE

## 2020-06-04 ENCOUNTER — Encounter: Payer: Self-pay | Admitting: Hematology and Oncology

## 2020-06-04 VITALS — BP 126/74 | HR 105 | Temp 97.5°F | Resp 18 | Ht 69.0 in | Wt 160.5 lb

## 2020-06-04 DIAGNOSIS — Z171 Estrogen receptor negative status [ER-]: Secondary | ICD-10-CM

## 2020-06-04 DIAGNOSIS — Z170 Estrogen receptor positive status [ER+]: Secondary | ICD-10-CM | POA: Insufficient documentation

## 2020-06-04 DIAGNOSIS — C50512 Malignant neoplasm of lower-outer quadrant of left female breast: Secondary | ICD-10-CM | POA: Diagnosis not present

## 2020-06-04 DIAGNOSIS — R634 Abnormal weight loss: Secondary | ICD-10-CM | POA: Insufficient documentation

## 2020-06-04 DIAGNOSIS — E876 Hypokalemia: Secondary | ICD-10-CM | POA: Insufficient documentation

## 2020-06-04 DIAGNOSIS — Z9221 Personal history of antineoplastic chemotherapy: Secondary | ICD-10-CM | POA: Insufficient documentation

## 2020-06-04 DIAGNOSIS — Z79899 Other long term (current) drug therapy: Secondary | ICD-10-CM | POA: Insufficient documentation

## 2020-06-04 DIAGNOSIS — Z923 Personal history of irradiation: Secondary | ICD-10-CM | POA: Insufficient documentation

## 2020-06-04 DIAGNOSIS — Z5112 Encounter for antineoplastic immunotherapy: Secondary | ICD-10-CM | POA: Insufficient documentation

## 2020-06-04 LAB — HEPATIC FUNCTION PANEL
ALT: 17 (ref 7–35)
AST: 18 (ref 13–35)
Alkaline Phosphatase: 118 (ref 25–125)
Bilirubin, Total: 0.2

## 2020-06-04 LAB — CBC AND DIFFERENTIAL
HCT: 34 — AB (ref 36–46)
Hemoglobin: 11.3 — AB (ref 12.0–16.0)
Neutrophils Absolute: 2.32
Platelets: 127 — AB (ref 150–399)
WBC: 3.8

## 2020-06-04 LAB — BASIC METABOLIC PANEL
BUN: 20 (ref 4–21)
CO2: 27 — AB (ref 13–22)
Chloride: 108 (ref 99–108)
Creatinine: 0.9 (ref 0.5–1.1)
Glucose: 112
Potassium: 3.5 (ref 3.4–5.3)
Sodium: 141 (ref 137–147)

## 2020-06-04 LAB — COMPREHENSIVE METABOLIC PANEL
Albumin: 4 (ref 3.5–5.0)
Calcium: 9.5 (ref 8.7–10.7)

## 2020-06-04 LAB — CBC: RBC: 3.68 — AB (ref 3.87–5.11)

## 2020-06-04 LAB — MAGNESIUM (CC13): Magnesium: 1.6

## 2020-06-04 NOTE — Progress Notes (Signed)
[facility]  [address] [place],  [place]  [address] [phone]  Clinic Day:  06/04/2020  Referring physician: Carolynne Edouard, MD   CHIEF COMPLAINT:  CC:  Stage IB HER2 receptor positive breast cancer  Current Treatment:  Docetaxel/carboplatin/trastuzumab/pertuzumab every 3 weeks   HISTORY OF PRESENT ILLNESS:  Brittney Burgess is a 60 y.o. female with stage IB (T1c N0 M0) HER2 receptor positiveleft breastcancer diagnosed in September 2021. Screening mammogram in August which revealed a mass with calcifications, a second asymmetry and possible dilated ducts within the left breast. Right breast was negative. Diagnostic unilateral left mammogram and left breast ultrasound confirmed a 1.6 cm elongated mass containing pleomorphic calcifications at 7 o'clock, a 5 mm hypoechoic indeterminate mass containing 2 benign-appearing calcification at 2 o'clock, and several retroareolar ecstatic ducts containing internal debris or possible tissue. Ultrasound guided biopsy in September of the mass at 5 o'clock revealed grade 3, invasive ductal carcinoma, grade 3.  Estrogen and progesterone receptors were negative with HER2 positive.  Ki67 was 50%. 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 Biopsy of the mass at 2 o'clock was consistent with fibrocystic changes, including sclerosing adenosis with calcifications. No atypia, in situ or invasive malignancy. Biopsy at 11:30 o'clock revealed a fragmented intraductal papilloma and dilated duct. No atypia, in situ or invasive malignancy identified.  She was treated with lumpectomy and sentinel lymph node biopsy in November. Surgical pathology from this procedure revealed a 1.2 cm, grade 3, invasive ductal carcinoma with high grade ductal carcinoma in situ. One sentinel lymph node was negative for malignancy.Margins were negative.  She also had placement of a port at that time.  She had a  hysterectomy and bilateral salpingo oophorectomy in 2004 due to menorrhagia. She is unsure if she was given hormone replacement.  She has baseline neuropathy of her feet secondary to diabetes.  She started adjuvant chemotherapy/HER2 targeted therapy with docetaxel/carboplatin/trastuzumab/pertuzumab (TCHP) in December. She was placed on gabapentin 300 mg 3 times daily in February for peripheral neuropathy of the bilateral feet and hands.  When she was seen prior to a 5th cycle of TCHP, she had worsening neuropathy with difficulty ambulating and was in a wheelchair. Due to this, we discontinued docetaxel.  INTERVAL HISTORY:  Brittney Burgess is here today evaluation after completing chemotherapy portion of her treatment plan. She is doing very well today. She denies fever, chills, nausea or vomiting. She denies issue with bowel or bladder. She denies shortness of breath, chest pain or cough. She states her neuropathy is improving. Her energy and appetite are good. We reviewed her plan to continue with trastuzumab/ pertuzumab every 3 weeks for one year and she is due to start adjuvant radiation therapy next week. CBC and CMP today are unremarkable.   REVIEW OF SYSTEMS:  Review of Systems  Constitutional: Negative for appetite change, chills, diaphoresis, fatigue, fever and unexpected weight change.  HENT:   Negative for hearing loss, lump/mass, mouth sores, nosebleeds, sore throat, tinnitus, trouble swallowing and voice change.   Eyes: Negative for eye problems and icterus.  Respiratory: Negative for chest tightness, cough, hemoptysis, shortness of breath and wheezing.   Cardiovascular: Negative for chest pain, leg swelling and palpitations.  Gastrointestinal: Negative for abdominal distention, abdominal pain, blood in stool, constipation, diarrhea, nausea, rectal pain and vomiting.  Endocrine: Negative for hot flashes.  Genitourinary: Negative for bladder incontinence, difficulty urinating, dyspareunia, dysuria,  frequency, hematuria and nocturia.   Musculoskeletal: Negative for arthralgias,  back pain, flank pain, gait problem, myalgias, neck pain and neck stiffness.  Skin: Negative for itching, rash and wound.  Neurological: Negative for dizziness, extremity weakness, gait problem, headaches, light-headedness, numbness, seizures and speech difficulty.  Hematological: Negative for adenopathy. Does not bruise/bleed easily.  Psychiatric/Behavioral: Negative for confusion, decreased concentration, depression, sleep disturbance and suicidal ideas. The patient is not nervous/anxious.     VITALS:  Blood pressure 126/74, pulse (!) 105, temperature (!) 97.5 F (36.4 C), temperature source Oral, resp. rate 18, height _0  (1.753 m), weight 160 lb 8 oz (72.8 kg), SpO2 100 %.  Wt Readings from Last 3 Encounters:  06/04/20 160 lb 8 oz (72.8 kg)  05/14/20 161 lb (73 kg)  04/29/20 158 lb 12 oz (72 kg)    Body mass index is 23.7 kg/m.  Performance status (ECOG): 3 - Symptomatic, >50% confined to bed  PHYSICAL EXAM:  Physical Exam Constitutional:      General: She is not in acute distress.    Appearance: Normal appearance. She is normal weight. She is not ill-appearing, toxic-appearing or diaphoretic.  HENT:     Head: Normocephalic and atraumatic.     Nose: Nose normal. No congestion or rhinorrhea.     Mouth/Throat:     Mouth: Mucous membranes are moist.     Pharynx: Oropharynx is clear. No oropharyngeal exudate or posterior oropharyngeal erythema.  Eyes:     General: No scleral icterus.       Right eye: No discharge.        Left eye: No discharge.     Extraocular Movements: Extraocular movements intact.     Conjunctiva/sclera: Conjunctivae normal.     Pupils: Pupils are equal, round, and reactive to light.  Neck:     Vascular: No carotid bruit.  Cardiovascular:     Rate and Rhythm: Normal rate and regular rhythm.     Heart sounds: No murmur heard. No friction rub. No gallop.   Pulmonary:      Effort: Pulmonary effort is normal. No respiratory distress.     Breath sounds: Normal breath sounds. No stridor. No wheezing, rhonchi or rales.  Chest:     Chest wall: No tenderness.  Abdominal:     General: Abdomen is flat. Bowel sounds are normal. There is no distension.     Palpations: There is no mass.     Tenderness: There is no abdominal tenderness. There is no right CVA tenderness, left CVA tenderness, guarding or rebound.     Hernia: No hernia is present.  Musculoskeletal:        General: No swelling, tenderness, deformity or signs of injury. Normal range of motion.     Cervical back: Normal range of motion and neck supple. No rigidity or tenderness.     Right lower leg: No edema.     Left lower leg: No edema.  Lymphadenopathy:     Cervical: No cervical adenopathy.  Skin:    General: Skin is warm and dry.     Capillary Refill: Capillary refill takes less than 2 seconds.     Coloration: Skin is not jaundiced or pale.     Findings: No bruising, erythema, lesion or rash.  Neurological:     General: No focal deficit present.     Mental Status: She is alert and oriented to person, place, and time. Mental status is at baseline.     Cranial Nerves: No cranial nerve deficit.     Sensory: No sensory deficit.  Motor: No weakness.     Coordination: Coordination normal.     Gait: Gait normal.     Deep Tendon Reflexes: Reflexes normal.  Psychiatric:        Mood and Affect: Mood normal.        Behavior: Behavior normal.        Thought Content: Thought content normal.        Judgment: Judgment normal.    LABS:   CBC Latest Ref Rng & Units 06/04/2020 05/14/2020 04/23/2020  WBC - 3.8 3.6 9.3  Hemoglobin 12.0 - 16.0 11.3(A) 11.3(A) 11.5(A)  Hematocrit 36 - 46 34(A) 34(A) 35(A)  Platelets 150 - 399 127(A) 140(A) 222   CMP Latest Ref Rng & Units 06/04/2020 05/14/2020 04/23/2020  BUN 4 - _0 Creatinine 0.5 - 1.1 0.9 0.7 0.7  Sodium 137 - 147 141 139 140  Potassium 3.4 - 5.3  3.5 3.2(A) 2.9(A)  Chloride 99 - 108 108 106 103  CO2 13 - 22 27(A) 25(A) 29(A)  Calcium 8.7 - 10.7 9.5 9.4 9.3  Alkaline Phos 25 - 125 118 112 97  AST 13 - 35 _1 ALT 7 - 35 _2 No results found for: CEA1 / No results found for: CEA1 No results found for: PSA1 No results found for: IAX655 No results found for: CAN125  No results found for: TOTALPROTELP, ALBUMINELP, A1GS, A2GS, BETS, BETA2SER, GAMS, MSPIKE, SPEI No results found for: TIBC, FERRITIN, IRONPCTSAT No results found for: LDH  STUDIES:  No results found.    HISTORY:   Past Medical History:  Diagnosis Date  . Breast cancer ([place])   . Diabetes mellitus without complication ([place])   . Heart disease   . 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 Hypertension     Past Surgical History:  Procedure Laterality Date  . ABDOMINAL HYSTERECTOMY    . BIOPSY THYROID  04/2019   2.9 cm mass, benign  . BREAST LUMPECTOMY Left 12/25/2019  . HYSTERECTOMY ABDOMINAL WITH SALPINGO-OOPHORECTOMY Bilateral 2004   due to menorrhagia  . MYOMECTOMY  1995   x6    Family History  Problem Relation Age of Onset  . Cancer Mother   . Cancer Maternal Aunt     Social History:  reports that she has been smoking cigarettes. She has a 32.00 pack-year smoking history. She has never used smokeless tobacco. She reports that she does not drink alcohol and does not use drugs.The patient is alone today.  Allergies:  Allergies  Allergen Reactions  . Metformin And Related Other (See Comments)    Twitching, nausea    Current Medications: Current Outpatient Medications  Medication Sig Dispense Refill  . amLODipine (NORVASC) 5 MG tablet Take 1 tablet (5 mg total) by mouth daily. 30 tablet 0  . COVID-19 mRNA vaccine, Moderna, 100 MCG/0.5ML injection USE AS DIRECTED .25 mL 0  . dexamethasone (DECADRON) 4 MG tablet TAKE 2 TABLETS BY MOUTH TWICE A DAY THE DAY BEFORE CHEMO. AND FOR 2 DAYS AFTER 60 tablet 0  . gabapentin (NEURONTIN) 300 MG  capsule Take 1 capsule (300 mg total) by mouth 3 (three) times daily. 90 capsule 0  . hydrochlorothiazide (HYDRODIURIL) 25 MG tablet TAKE 1 TABLET (25 MG TOTAL) BY MOUTH DAILY. 30 tablet 0  . insulin glargine (LANTUS) 100 UNIT/ML Solostar Pen Inject into the skin. 36-41 units    . JANUVIA 100 MG tablet Take 100 mg by mouth daily.    Marland Kitchen  KLOR-CON M20 20 MEQ tablet TAKE 1 TABLET BY MOUTH TWICE A DAY 60 tablet 2  . losartan (COZAAR) 50 MG tablet Take 1 tablet (50 mg total) by mouth daily. 30 tablet 1  . nicotine polacrilex (NICORETTE) 2 MG gum Chew 1 piece in mouth every two hours as directed every 1-2 hours when you have urge to smoke, max 24 pieces per day    . omeprazole (PRILOSEC) 40 MG capsule Take 1 capsule (40 mg total) by mouth daily. 60 capsule 3  . ondansetron (ZOFRAN) 4 MG tablet Take 1 tablet (4 mg total) by mouth every 4 (four) hours as needed for nausea. 90 tablet 3  . ondansetron (ZOFRAN) 4 MG tablet Take 1 tablet (4 mg total) by mouth every 4 (four) hours as needed for nausea. 90 tablet 3  . oxyCODONE (OXY IR/ROXICODONE) 5 MG immediate release tablet SMARTSIG:1 By Mouth 4-5 Times Daily    . prochlorperazine (COMPAZINE) 10 MG tablet Take 1 tablet (10 mg total) by mouth every 6 (six) hours as needed for nausea or vomiting. 90 tablet 3   No current facility-administered medications for this visit.     ASSESSMENT & PLAN:   Assessment:  1. 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 She completed chemotherapy at the end of April.  We will continue with maintenance tastuzumab/pertuzumab every 3 weeks for a total of 1 year of HER2 targeted therapy. She will also begin adjuvant radiation therapy. She has already met with Dr. Orlene Erm and will begin simulation next week.   2.  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 She has persistent numbness, but improvement in her weakness in only occasional pain.  Due to the numbness, she is at increased risk for falls.  This is much improved with the discontinuation of docetaxel and addition of gabapentin.     Plan:  She will continue with maintenance trastuzumab/ pertuzumab every 3 weeks for a total of 1 year. We reviewed the reasoning behind this plan. She will proceed with adjuvant radiation next week. We will see her back in clinic in 3 weeks for evaluation prior to next cycle of maintenance therapy.   She verbalizes understanding of and agreement to the plans discussed today. She knows to call the office should any new questions or concerns arise.   [name], NP

## 2020-06-04 NOTE — Progress Notes (Signed)
The following Medication: [facility] is approved for drug replacement program by [place]. The enrollment period is from 06/03/2020 to patient stops this treatment.  Reason for Assistance: No Insurance. ID: PAT - [id number] First DOS: 06/10/2020

## 2020-06-04 NOTE — Telephone Encounter (Signed)
Per 5/5 los next appt scheduled and given to patient 

## 2020-06-10 ENCOUNTER — Other Ambulatory Visit: Payer: Self-pay

## 2020-06-10 ENCOUNTER — Inpatient Hospital Stay: Payer: PRIVATE HEALTH INSURANCE

## 2020-06-10 VITALS — BP 148/94 | HR 101 | Temp 97.7°F | Resp 18 | Ht 69.0 in | Wt 162.1 lb

## 2020-06-10 DIAGNOSIS — Z79899 Other long term (current) drug therapy: Secondary | ICD-10-CM | POA: Diagnosis not present

## 2020-06-10 DIAGNOSIS — C50512 Malignant neoplasm of lower-outer quadrant of left female breast: Secondary | ICD-10-CM | POA: Diagnosis present

## 2020-06-10 DIAGNOSIS — Z170 Estrogen receptor positive status [ER+]: Secondary | ICD-10-CM | POA: Diagnosis not present

## 2020-06-10 DIAGNOSIS — E876 Hypokalemia: Secondary | ICD-10-CM | POA: Diagnosis not present

## 2020-06-10 DIAGNOSIS — Z923 Personal history of irradiation: Secondary | ICD-10-CM | POA: Diagnosis not present

## 2020-06-10 DIAGNOSIS — Z171 Estrogen receptor negative status [ER-]: Secondary | ICD-10-CM

## 2020-06-10 DIAGNOSIS — R634 Abnormal weight loss: Secondary | ICD-10-CM | POA: Diagnosis not present

## 2020-06-10 DIAGNOSIS — Z5112 Encounter for antineoplastic immunotherapy: Secondary | ICD-10-CM | POA: Diagnosis present

## 2020-06-10 DIAGNOSIS — Z9221 Personal history of antineoplastic chemotherapy: Secondary | ICD-10-CM | POA: Diagnosis not present

## 2020-06-10 MED ORDER — SODIUM CHLORIDE 0.9 % IV SOLN
420.0000 mg | Freq: Once | INTRAVENOUS | Status: AC
  Administered 2020-06-10: 420 mg via INTRAVENOUS
  Filled 2020-06-10: qty 14

## 2020-06-10 MED ORDER — HEPARIN SOD (PORK) LOCK FLUSH 100 UNIT/ML IV SOLN
500.0000 [IU] | Freq: Once | INTRAVENOUS | Status: AC | PRN
  Administered 2020-06-10: 500 [IU]
  Filled 2020-06-10: qty 5

## 2020-06-10 MED ORDER — DIPHENHYDRAMINE HCL 50 MG/ML IJ SOLN
INTRAMUSCULAR | Status: AC
  Filled 2020-06-10: qty 1

## 2020-06-10 MED ORDER — ACETAMINOPHEN 325 MG PO TABS
ORAL_TABLET | ORAL | Status: AC
  Filled 2020-06-10: qty 2

## 2020-06-10 MED ORDER — SODIUM CHLORIDE 0.9 % IV SOLN
Freq: Once | INTRAVENOUS | Status: AC
  Filled 2020-06-10: qty 250

## 2020-06-10 MED ORDER — TRASTUZUMAB-ANNS CHEMO 150 MG IV SOLR
6.0000 mg/kg | Freq: Once | INTRAVENOUS | Status: AC
  Administered 2020-06-10: 462 mg via INTRAVENOUS
  Filled 2020-06-10: qty 22

## 2020-06-10 MED ORDER — ACETAMINOPHEN 325 MG PO TABS
650.0000 mg | ORAL_TABLET | Freq: Once | ORAL | Status: AC
  Administered 2020-06-10: 650 mg via ORAL

## 2020-06-10 MED ORDER — DIPHENHYDRAMINE HCL 50 MG/ML IJ SOLN
25.0000 mg | Freq: Once | INTRAMUSCULAR | Status: AC
  Administered 2020-06-10: 25 mg via INTRAVENOUS

## 2020-06-10 NOTE — Patient Instructions (Signed)
[facility]  Discharge Instructions: Thank you for choosing [facility] to provide your oncology and hematology care.  If you have a lab appointment with the Cancer Center, please go directly to the Cancer Center and check in at the registration area.   Wear comfortable clothing and clothing appropriate for easy access to any Portacath or PICC line.   We strive to give you quality time with your provider. You may need to reschedule your appointment if you arrive late (15 or more minutes).  Arriving late affects you and other patients whose appointments are after yours.  Also, if you miss three or more appointments without notifying the office, you may be dismissed from the clinic at the provider's discretion.      For prescription refill requests, have your pharmacy contact our office and allow 72 hours for refills to be completed.    Today you received the following chemotherapy and/or immunotherapy agents Pertuzumab, Trastuzumab      To help prevent nausea and vomiting after your treatment, we encourage you to take your nausea medication as directed.  BELOW ARE SYMPTOMS THAT SHOULD BE REPORTED IMMEDIATELY: . *FEVER GREATER THAN 100.4 F (38 C) OR HIGHER . *CHILLS OR SWEATING . *NAUSEA AND VOMITING THAT IS NOT CONTROLLED WITH YOUR NAUSEA MEDICATION . *UNUSUAL SHORTNESS OF BREATH . *UNUSUAL BRUISING OR BLEEDING . *URINARY PROBLEMS (pain or burning when urinating, or frequent urination) . *BOWEL PROBLEMS (unusual diarrhea, constipation, pain near the anus) . TENDERNESS IN MOUTH AND THROAT WITH OR WITHOUT PRESENCE OF ULCERS (sore throat, sores in mouth, or a toothache) . UNUSUAL RASH, SWELLING OR PAIN  . UNUSUAL VAGINAL DISCHARGE OR ITCHING   Items with * indicate a potential emergency and should be followed up as soon as possible or go to the Emergency Department if any problems should occur.  Please show the CHEMOTHERAPY ALERT CARD or IMMUNOTHERAPY  ALERT CARD at check-in to the Emergency Department and triage nurse.  Should you have questions after your visit or need to cancel or reschedule your appointment, please contact [facility]  Dept: [phone]  and follow the prompts.  Office hours are 8:00 a.m. to 4:30 p.m. Monday - Friday. Please note that voicemails left after 4:00 p.m. may not be returned until the following business day.  We are closed weekends and major holidays. You have access to a nurse at all times for urgent questions. Please call the main number to the clinic Dept: [phone] and follow the prompts.  For any non-urgent questions, you may also contact your provider using MyChart. We now offer e-Visits for anyone 18 and older to request care online for non-urgent symptoms. For details visit [email or web address].   Also download the MyChart app! Go to the app store, search "MyChart", open the app, select [facility], and log in with your MyChart username and password.  Due to Covid, a mask is required upon entering the hospital/clinic. If you do not have a mask, one will be given to you upon arrival. For doctor visits, patients may have 1 support person aged 18 or older with them. For treatment visits, patients cannot have anyone with them due to current Covid guidelines and our immunocompromised population.    

## 2020-06-16 ENCOUNTER — Other Ambulatory Visit: Payer: Self-pay | Admitting: Hematology and Oncology

## 2020-06-16 DIAGNOSIS — R600 Localized edema: Secondary | ICD-10-CM

## 2020-06-22 ENCOUNTER — Encounter: Payer: Self-pay | Admitting: Hematology and Oncology

## 2020-06-22 ENCOUNTER — Encounter: Payer: Self-pay | Admitting: Oncology

## 2020-06-25 ENCOUNTER — Encounter: Payer: Self-pay | Admitting: Oncology

## 2020-06-25 ENCOUNTER — Encounter: Payer: Self-pay | Admitting: Hematology and Oncology

## 2020-06-25 ENCOUNTER — Inpatient Hospital Stay (INDEPENDENT_AMBULATORY_CARE_PROVIDER_SITE_OTHER): Payer: PRIVATE HEALTH INSURANCE | Admitting: Hematology and Oncology

## 2020-06-25 ENCOUNTER — Telehealth: Payer: Self-pay | Admitting: Hematology and Oncology

## 2020-06-25 ENCOUNTER — Inpatient Hospital Stay: Payer: PRIVATE HEALTH INSURANCE

## 2020-06-25 ENCOUNTER — Other Ambulatory Visit: Payer: Self-pay

## 2020-06-25 VITALS — BP 136/83 | HR 99 | Temp 97.8°F | Resp 18 | Ht 69.0 in | Wt 152.9 lb

## 2020-06-25 DIAGNOSIS — C50512 Malignant neoplasm of lower-outer quadrant of left female breast: Secondary | ICD-10-CM

## 2020-06-25 DIAGNOSIS — Z171 Estrogen receptor negative status [ER-]: Secondary | ICD-10-CM

## 2020-06-25 LAB — HEPATIC FUNCTION PANEL
ALT: 10 (ref 7–35)
AST: 17 (ref 13–35)
Alkaline Phosphatase: 114 (ref 25–125)
Bilirubin, Total: 0.6

## 2020-06-25 LAB — COMPREHENSIVE METABOLIC PANEL
Albumin: 4.7 (ref 3.5–5.0)
Calcium: 9.8 (ref 8.7–10.7)

## 2020-06-25 LAB — BASIC METABOLIC PANEL
BUN: 20 (ref 4–21)
CO2: 28 — AB (ref 13–22)
Chloride: 104 (ref 99–108)
Creatinine: 1.1 (ref 0.5–1.1)
Glucose: 70
Potassium: 3 — AB (ref 3.4–5.3)
Sodium: 143 (ref 137–147)

## 2020-06-25 LAB — CBC AND DIFFERENTIAL
HCT: 38 (ref 36–46)
Hemoglobin: 12.8 (ref 12.0–16.0)
Neutrophils Absolute: 5.62
Platelets: 218 (ref 150–399)
WBC: 7.2

## 2020-06-25 LAB — CBC: RBC: 4.23 (ref 3.87–5.11)

## 2020-06-25 MED ORDER — GABAPENTIN 300 MG PO CAPS: 300.0000 mg | ORAL_CAPSULE | Freq: Two times a day (BID) | ORAL | 0 refills | Status: DC

## 2020-06-25 MED ORDER — ONDANSETRON HCL 4 MG PO TABS: 4.0000 mg | ORAL_TABLET | ORAL | 3 refills | Status: DC | PRN

## 2020-06-25 MED ORDER — PROCHLORPERAZINE MALEATE 10 MG PO TABS: 10.0000 mg | ORAL_TABLET | Freq: Four times a day (QID) | ORAL | 3 refills | Status: DC | PRN

## 2020-06-25 NOTE — Telephone Encounter (Signed)
Per 5/26 los next appt scheduled and given to patient 

## 2020-06-25 NOTE — Progress Notes (Signed)
[facility]  [address] [place],    [address] [phone]  Clinic Day:  06/25/2020  Referring physician: Carolynne Edouard, MD   CHIEF COMPLAINT:  CC: A 60 year old female with history of Stage IB HER2 receptor positive breast cancer here for 3 week evaluation  Current Treatment:  Docetaxel/carboplatin/trastuzumab/pertuzumab every 3 weeks   HISTORY OF PRESENT ILLNESS:  Brittney Burgess is a 60 y.o. female with stage IB (T1c N0 M0) HER2 receptor positiveleft breastcancer diagnosed in September 2021. Screening mammogram in August which revealed a mass with calcifications, a second asymmetry and possible dilated ducts within the left breast. Right breast was negative. Diagnostic unilateral left mammogram and left breast ultrasound confirmed a 1.6 cm elongated mass containing pleomorphic calcifications at 7 o'clock, a 5 mm hypoechoic indeterminate mass containing 2 benign-appearing calcification at 2 o'clock, and several retroareolar ecstatic ducts containing internal debris or possible tissue. Ultrasound guided biopsy in September of the mass at 5 o'clock revealed grade 3, invasive ductal carcinoma, grade 3.  Estrogen and progesterone receptors were negative with HER2 positive.  Ki67 was 50%. 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 Biopsy of the mass at 2 o'clock was consistent with fibrocystic changes, including sclerosing adenosis with calcifications. No atypia, in situ or invasive malignancy. Biopsy at 11:30 o'clock revealed a fragmented intraductal papilloma and dilated duct. No atypia, in situ or invasive malignancy identified.  She was treated with lumpectomy and sentinel lymph node biopsy in November. Surgical pathology from this procedure revealed a 1.2 cm, grade 3, invasive ductal carcinoma with high grade ductal carcinoma in situ. One sentinel lymph node was negative for malignancy.Margins were negative.  She  also had placement of a port at that time.  She had a hysterectomy and bilateral salpingo oophorectomy in 2004 due to menorrhagia. She is unsure if she was given hormone replacement.  She has baseline neuropathy of her feet secondary to diabetes.  She started adjuvant chemotherapy/HER2 targeted therapy with docetaxel/carboplatin/trastuzumab/pertuzumab (TCHP) in December. She was placed on gabapentin 300 mg 3 times daily in February for peripheral neuropathy of the bilateral feet and hands.  When she was seen prior to a 5th cycle of TCHP, she had worsening neuropathy with difficulty ambulating and was in a wheelchair. Due to this, we discontinued docetaxel.  INTERVAL HISTORY:  Brittney Burgess is here today for evaluation prior to next cycle of maintenance HER2 targeted therapy. She has completed radiation therapy. Today she complains of decreased appetite and some nausea. She has lost 10 pounds since last visit. We discussed multiple plans to help her gain weight including increasing her shakes to 3 per day and using her nausea meds prior to meals. She also has some increase in fatigue, this could be related to radiation. She denies fever, chills, nausea or vomiting. She denies shortness of breath, chest pain or cough. She denies issue with bowel or bladder. CBC is unremarkable today. CMP reveals potassium 3.0. She admits that she has forgotten her doses the last couple of days.   REVIEW OF SYSTEMS:  Review of Systems  Constitutional: Positive for appetite change, fatigue and unexpected weight change. Negative for chills, diaphoresis and fever.  HENT:   Negative for hearing loss, lump/mass, mouth sores, nosebleeds, sore throat, tinnitus, trouble swallowing and voice change.   Eyes: Negative for eye problems and icterus.  Respiratory: Negative for chest tightness, cough, hemoptysis, shortness of breath and wheezing.   Cardiovascular: Negative for chest pain, leg swelling  and palpitations.  Gastrointestinal:  Positive for nausea. Negative for abdominal distention, abdominal pain, blood in stool, constipation, diarrhea, rectal pain and vomiting.  Endocrine: Negative for hot flashes.  Genitourinary: Negative for bladder incontinence, difficulty urinating, dyspareunia, dysuria, frequency, hematuria and nocturia.   Musculoskeletal: Negative for arthralgias, back pain, flank pain, gait problem, myalgias, neck pain and neck stiffness.  Skin: Negative for itching, rash and wound.  Neurological: Positive for numbness. Negative for dizziness, extremity weakness, gait problem, headaches, light-headedness, seizures and speech difficulty.  Hematological: Negative for adenopathy. Does not bruise/bleed easily.  Psychiatric/Behavioral: Negative for confusion, decreased concentration, depression, sleep disturbance and suicidal ideas. The patient is not nervous/anxious.     VITALS:  Blood pressure 136/83, pulse 99, temperature 97.8 F (36.6 C), temperature source Oral, resp. rate 18, height '5\' 9"'  (1.753 m), weight 152 lb 14.4 oz (69.4 kg), SpO2 98 %.  Wt Readings from Last 3 Encounters:  06/25/20 152 lb 14.4 oz (69.4 kg)  06/10/20 162 lb 2 oz (73.5 kg)  06/04/20 160 lb 8 oz (72.8 kg)    Body mass index is 22.58 kg/m.  Performance status (ECOG): 3 - Symptomatic, >50% confined to bed  PHYSICAL EXAM:  Physical Exam Constitutional:      General: She is not in acute distress.    Appearance: Normal appearance. She is normal weight. She is not ill-appearing, toxic-appearing or diaphoretic.  HENT:     Head: Normocephalic and atraumatic.     Nose: Nose normal. No congestion or rhinorrhea.     Mouth/Throat:     Mouth: Mucous membranes are moist.     Pharynx: Oropharynx is clear. No oropharyngeal exudate or posterior oropharyngeal erythema.  Eyes:     General: No scleral icterus.       Right eye: No discharge.        Left eye: No discharge.     Extraocular Movements: Extraocular movements intact.      Conjunctiva/sclera: Conjunctivae normal.     Pupils: Pupils are equal, round, and reactive to light.  Neck:     Vascular: No carotid bruit.  Cardiovascular:     Rate and Rhythm: Normal rate and regular rhythm.     Heart sounds: No murmur heard. No friction rub. No gallop.   Pulmonary:     Effort: Pulmonary effort is normal. No respiratory distress.     Breath sounds: Normal breath sounds. No stridor. No wheezing, rhonchi or rales.  Chest:     Chest wall: No tenderness.  Abdominal:     General: Abdomen is flat. Bowel sounds are normal. There is no distension.     Palpations: There is no mass.     Tenderness: There is no abdominal tenderness. There is no right CVA tenderness, left CVA tenderness, guarding or rebound.     Hernia: No hernia is present.  Musculoskeletal:        General: No swelling, tenderness, deformity or signs of injury. Normal range of motion.     Cervical back: Normal range of motion and neck supple. No rigidity or tenderness.     Right lower leg: No edema.     Left lower leg: No edema.  Lymphadenopathy:     Cervical: No cervical adenopathy.  Skin:    General: Skin is warm and dry.     Capillary Refill: Capillary refill takes less than 2 seconds.     Coloration: Skin is not jaundiced or pale.     Findings: No bruising, erythema, lesion or rash.  Neurological:     General: No focal deficit present.     Mental Status: She is alert and oriented to person, place, and time. Mental status is at baseline.     Cranial Nerves: No cranial nerve deficit.     Sensory: No sensory deficit.     Motor: No weakness.     Coordination: Coordination normal.     Gait: Gait normal.     Deep Tendon Reflexes: Reflexes normal.  Psychiatric:        Mood and Affect: Mood normal.        Behavior: Behavior normal.        Thought Content: Thought content normal.        Judgment: Judgment normal.    LABS:   CBC Latest Ref Rng & Units 06/25/2020 06/04/2020 05/14/2020  WBC - 7.2 3.8 3.6   Hemoglobin 12.0 - 16.0 12.8 11.3(A) 11.3(A)  Hematocrit 36 - 46 38 34(A) 34(A)  Platelets 150 - 399 218 127(A) 140(A)   CMP Latest Ref Rng & Units 06/25/2020 06/04/2020 05/14/2020  BUN 4 - '21 20 20 14  ' Creatinine 0.5 - 1.1 1.1 0.9 0.7  Sodium 137 - 147 143 141 139  Potassium 3.4 - 5.3 3.0(A) 3.5 3.2(A)  Chloride 99 - 108 104 108 106  CO2 13 - 22 28(A) 27(A) 25(A)  Calcium 8.7 - 10.7 9.8 9.5 9.4  Alkaline Phos 25 - 125 114 118 112  AST 13 - 35 '17 18 18  ' ALT 7 - 35 '10 17 16     ' No results found for: CEA1 / No results found for: CEA1 No results found for: PSA1 No results found for: FGH829 No results found for: CAN125  No results found for: TOTALPROTELP, ALBUMINELP, A1GS, A2GS, BETS, BETA2SER, GAMS, MSPIKE, SPEI No results found for: TIBC, FERRITIN, IRONPCTSAT No results found for: LDH  STUDIES:  No results found.    HISTORY:   Past Medical History:  Diagnosis Date  . Breast cancer ([facility])   . Diabetes mellitus without complication ([facility])   . Heart disease   . 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 Hypertension     Past Surgical History:  Procedure Laterality Date  . ABDOMINAL HYSTERECTOMY    . BIOPSY THYROID  04/2019   2.9 cm mass, benign  . BREAST LUMPECTOMY Left 12/25/2019  . HYSTERECTOMY ABDOMINAL WITH SALPINGO-OOPHORECTOMY Bilateral 2004   due to menorrhagia  . MYOMECTOMY  1995   x6    Family History  Problem Relation Age of Onset  . Cancer Mother   . Cancer Maternal Aunt     Social History:  reports that she has been smoking cigarettes. She has a 32.00 pack-year smoking history. She has never used smokeless tobacco. She reports that she does not drink alcohol and does not use drugs.The patient is alone today.  Allergies:  Allergies  Allergen Reactions  . Metformin And Related Other (See Comments)    Twitching, nausea    Current Medications: Current Outpatient Medications  Medication Sig Dispense Refill  . gabapentin (NEURONTIN) 300 MG capsule Take 1  capsule (300 mg total) by mouth 2 (two) times daily. 60 capsule 0  . loratadine (CLARITIN) 10 MG tablet Take 10 mg by mouth daily.    . ondansetron (ZOFRAN) 4 MG tablet Take 1 tablet (4 mg total) by mouth every 4 (four) hours as needed for nausea. 90 tablet 3  . prochlorperazine (COMPAZINE) 10 MG tablet Take 1 tablet (10 mg total) by mouth every  6 (six) hours as needed for nausea or vomiting. 90 tablet 3  . amLODipine (NORVASC) 5 MG tablet Take 1 tablet (5 mg total) by mouth daily. 30 tablet 0  . COVID-19 mRNA vaccine, Moderna, 100 MCG/0.5ML injection USE AS DIRECTED .25 mL 0  . dexamethasone (DECADRON) 4 MG tablet TAKE 2 TABLETS BY MOUTH TWICE A DAY THE DAY BEFORE CHEMO. AND FOR 2 DAYS AFTER 60 tablet 0  . hydrochlorothiazide (HYDRODIURIL) 25 MG tablet TAKE 1 TABLET (25 MG TOTAL) BY MOUTH DAILY. 30 tablet 0  . insulin glargine (LANTUS) 100 UNIT/ML Solostar Pen Inject into the skin. 36-41 units    . JANUVIA 100 MG tablet Take 100 mg by mouth daily.    Marland Kitchen KLOR-CON M20 20 MEQ tablet TAKE 1 TABLET BY MOUTH TWICE A DAY 60 tablet 2  . losartan (COZAAR) 50 MG tablet Take 1 tablet (50 mg total) by mouth daily. 30 tablet 1  . nicotine polacrilex (NICORETTE) 2 MG gum Chew 1 piece in mouth every two hours as directed every 1-2 hours when you have urge to smoke, max 24 pieces per day    . omeprazole (PRILOSEC) 40 MG capsule Take 1 capsule (40 mg total) by mouth daily. 60 capsule 3  . oxyCODONE (OXY IR/ROXICODONE) 5 MG immediate release tablet SMARTSIG:1 By Mouth 4-5 Times Daily     No current facility-administered medications for this visit.     ASSESSMENT & PLAN:   Assessment:  1. 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 She completed chemotherapy at the end of April.  We will continue with maintenance tastuzumab/pertuzumab every 3 weeks for a total of 1 year of HER2 targeted therapy. She will also begin adjuvant radiation therapy. She has  already met with Dr. Orlene Erm and will begin simulation next week. She has completed radiation therapy. She continues maintenance HER2 targeted therapy.   2.  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 She has persistent numbness, but improvement in her weakness in only occasional pain. Due to the numbness, she is at increased risk for falls. She continues to have increased neuropathy despite gabapentin. We will increase to twice daily.  3. Hypokalemia. We will increase to twice daily x one week and repeat level.  4. Weight loss. She has nausea when eating. She also has noticed some changes in taste. She has a 10 pound weight loss today.     Plan:  She will continue with maintenance trastuzumab/ pertuzumab every 3 weeks for a total of 1 year. She will use her nausea meds prior to meals and I will refill those meds today. She will increase her potassium to twice daily for one week and we will repeat her potassium level. She will increase her shakes from 2 to 3 a day. She will increase her gabapentin from once a day to twice a day. She will return to clinic in 3 weeks for repeat evaluation.   She verbalizes understanding of and agreement to the plans discussed today. She knows to call the office should any new questions or concerns arise.   [name], NP

## 2020-06-30 ENCOUNTER — Encounter: Payer: Self-pay | Admitting: Oncology

## 2020-07-01 ENCOUNTER — Other Ambulatory Visit: Payer: Self-pay

## 2020-07-01 ENCOUNTER — Encounter: Payer: Self-pay | Admitting: Oncology

## 2020-07-01 ENCOUNTER — Inpatient Hospital Stay: Payer: PRIVATE HEALTH INSURANCE

## 2020-07-01 ENCOUNTER — Inpatient Hospital Stay: Payer: PRIVATE HEALTH INSURANCE | Attending: Hematology and Oncology

## 2020-07-01 ENCOUNTER — Encounter: Payer: Self-pay | Admitting: Hematology and Oncology

## 2020-07-01 VITALS — BP 127/83 | HR 97 | Temp 97.3°F | Resp 18 | Ht 69.0 in | Wt 152.5 lb

## 2020-07-01 DIAGNOSIS — Z171 Estrogen receptor negative status [ER-]: Secondary | ICD-10-CM | POA: Diagnosis not present

## 2020-07-01 DIAGNOSIS — Z79899 Other long term (current) drug therapy: Secondary | ICD-10-CM | POA: Diagnosis not present

## 2020-07-01 DIAGNOSIS — Z5112 Encounter for antineoplastic immunotherapy: Secondary | ICD-10-CM | POA: Diagnosis present

## 2020-07-01 DIAGNOSIS — E876 Hypokalemia: Secondary | ICD-10-CM | POA: Insufficient documentation

## 2020-07-01 DIAGNOSIS — C50512 Malignant neoplasm of lower-outer quadrant of left female breast: Secondary | ICD-10-CM | POA: Diagnosis present

## 2020-07-01 DIAGNOSIS — R634 Abnormal weight loss: Secondary | ICD-10-CM | POA: Insufficient documentation

## 2020-07-01 MED ORDER — DIPHENHYDRAMINE HCL 50 MG/ML IJ SOLN
INTRAMUSCULAR | Status: AC
  Filled 2020-07-01: qty 1

## 2020-07-01 MED ORDER — DIPHENHYDRAMINE HCL 50 MG/ML IJ SOLN
25.0000 mg | Freq: Once | INTRAMUSCULAR | Status: AC
Start: 2020-07-01 — End: 2020-07-01
  Administered 2020-07-01: 25 mg via INTRAVENOUS

## 2020-07-01 MED ORDER — HEPARIN SOD (PORK) LOCK FLUSH 100 UNIT/ML IV SOLN
500.0000 [IU] | Freq: Once | INTRAVENOUS | Status: AC | PRN
  Administered 2020-07-01: 500 [IU]
  Filled 2020-07-01: qty 5

## 2020-07-01 MED ORDER — ACETAMINOPHEN 325 MG PO TABS
ORAL_TABLET | ORAL | Status: AC
  Filled 2020-07-01: qty 2

## 2020-07-01 MED ORDER — SODIUM CHLORIDE 0.9 % IV SOLN
420.0000 mg | Freq: Once | INTRAVENOUS | Status: AC
  Administered 2020-07-01: 420 mg via INTRAVENOUS
  Filled 2020-07-01: qty 14

## 2020-07-01 MED ORDER — TRASTUZUMAB-ANNS CHEMO 150 MG IV SOLR
6.0000 mg/kg | Freq: Once | INTRAVENOUS | Status: AC
  Administered 2020-07-01: 420 mg via INTRAVENOUS
  Filled 2020-07-01: qty 20

## 2020-07-01 MED ORDER — ACETAMINOPHEN 325 MG PO TABS
650.0000 mg | ORAL_TABLET | Freq: Once | ORAL | Status: AC
Start: 2020-07-01 — End: 2020-07-01
  Administered 2020-07-01: 650 mg via ORAL

## 2020-07-01 MED ORDER — SODIUM CHLORIDE 0.9 % IV SOLN
Freq: Once | INTRAVENOUS | Status: AC
Start: 2020-07-01 — End: 2020-07-01
  Filled 2020-07-01: qty 250

## 2020-07-01 NOTE — Patient Instructions (Signed)
[facility]  Discharge Instructions: Thank you for choosing [facility] to provide your oncology and hematology care.  If you have a lab appointment with the [facility], please go directly to the [facility] and check in at the registration area.   Wear comfortable clothing and clothing appropriate for easy access to any Portacath or PICC line.   We strive to give you quality time with your provider. You may need to reschedule your appointment if you arrive late (15 or more minutes).  Arriving late affects you and other patients whose appointments are after yours.  Also, if you miss three or more appointments without notifying the office, you may be dismissed from the clinic at the provider's discretion.      For prescription refill requests, have your pharmacy contact our office and allow 72 hours for refills to be completed.    Today you received the following chemotherapy and/or immunotherapy agents Pertuzumab, Trastuzumab      To help prevent nausea and vomiting after your treatment, we encourage you to take your nausea medication as directed.  BELOW ARE SYMPTOMS THAT SHOULD BE REPORTED IMMEDIATELY: . *FEVER GREATER THAN 100.4 F (38 C) OR HIGHER . *CHILLS OR SWEATING . *NAUSEA AND VOMITING THAT IS NOT CONTROLLED WITH YOUR NAUSEA MEDICATION . *UNUSUAL SHORTNESS OF BREATH . *UNUSUAL BRUISING OR BLEEDING . *URINARY PROBLEMS (pain or burning when urinating, or frequent urination) . *BOWEL PROBLEMS (unusual diarrhea, constipation, pain near the anus) . TENDERNESS IN MOUTH AND THROAT WITH OR WITHOUT PRESENCE OF ULCERS (sore throat, sores in mouth, or a toothache) . UNUSUAL RASH, SWELLING OR PAIN  . UNUSUAL VAGINAL DISCHARGE OR ITCHING   Items with * indicate a potential emergency and should be followed up as soon as possible or go to the Emergency Department if any problems should occur.  Please show the CHEMOTHERAPY ALERT CARD or IMMUNOTHERAPY  ALERT CARD at check-in to the Emergency Department and triage nurse.  Should you have questions after your visit or need to cancel or reschedule your appointment, please contact [facility]  Dept: [phone]  and follow the prompts.  Office hours are 8:00 a.m. to 4:30 p.m. Monday - Friday. Please note that voicemails left after 4:00 p.m. may not be returned until the following business day.  We are closed weekends and major holidays. You have access to a nurse at all times for urgent questions. Please call the main number to the clinic Dept: [phone] and follow the prompts.  For any non-urgent questions, you may also contact your provider using MyChart. We now offer e-Visits for anyone 47 and older to request care online for non-urgent symptoms. For details visit [email or web address].   Also download the MyChart app! Go to the app store, search "MyChart", open the app, select [facility], and log in with your MyChart username and password.  Due to Covid, a mask is required upon entering the hospital/clinic. If you do not have a mask, one will be given to you upon arrival. For doctor visits, patients may have 1 support person aged 80 or older with them. For treatment visits, patients cannot have anyone with them due to current Covid guidelines and our immunocompromised population.

## 2020-07-06 ENCOUNTER — Encounter: Payer: Self-pay | Admitting: Oncology

## 2020-07-09 ENCOUNTER — Other Ambulatory Visit: Payer: Self-pay | Admitting: Hematology and Oncology

## 2020-07-09 DIAGNOSIS — R600 Localized edema: Secondary | ICD-10-CM

## 2020-07-09 DIAGNOSIS — I10 Essential (primary) hypertension: Secondary | ICD-10-CM

## 2020-07-14 ENCOUNTER — Other Ambulatory Visit: Payer: Self-pay | Admitting: Hematology and Oncology

## 2020-07-14 DIAGNOSIS — R600 Localized edema: Secondary | ICD-10-CM

## 2020-07-15 NOTE — Progress Notes (Signed)
[facility]  [address] [place],  [place]  [address] [phone]  Clinic Day:  07/16/2020  Referring physician: Carolynne Edouard, MD   CHIEF COMPLAINT:  CC: A 60 year old female with history of Stage IB HER2 receptor positive breast cancer here for 3 week evaluation  Current Treatment:  Maintenance trastuzumab/pertuzumab every 3 weeks   HISTORY OF PRESENT ILLNESS:  Brittney Burgess is a 60 y.o. 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  Screening mammogram in August which revealed a mass with calcifications, a second asymmetry and possible dilated ducts within the left breast.  Right breast was negative.  Diagnostic unilateral left mammogram and left breast ultrasound confirmed a 1.6 cm elongated mass containing pleomorphic calcifications at 7 o'clock, a 5 mm hypoechoic indeterminate mass containing 2 benign-appearing calcification at 2 o'clock, and several retroareolar ecstatic ducts containing internal debris or possible tissue.  Ultrasound guided biopsy in September of the mass at 5 o'clock revealed grade 3, invasive ductal carcinoma, grade 3.  Estrogen and progesterone receptors were negative with HER2 positive.  Ki67 was 50%. 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  Biopsy of the mass at 2 o'clock was consistent with fibrocystic changes, including sclerosing adenosis with calcifications.  No atypia, in situ or invasive malignancy. Biopsy at 11:30 o'clock revealed a fragmented intraductal papilloma and dilated duct.  No atypia, in situ or invasive malignancy identified.  She was treated with lumpectomy and sentinel lymph node biopsy in November.  Surgical pathology from this procedure revealed a 1.2 cm, grade 3, invasive ductal carcinoma with high grade ductal carcinoma in situ.  One sentinel lymph node was negative for malignancy.  Margins were negative.  She also had  placement of a port at that time.  She had a hysterectomy and bilateral salpingo oophorectomy in 2004 due to menorrhagia.  She is unsure if she was given hormone replacement.  She has baseline neuropathy of her feet secondary to diabetes.  She started adjuvant chemotherapy/HER2 targeted therapy with docetaxel/carboplatin/trastuzumab/pertuzumab (TCHP) in December. She was placed on gabapentin 300 mg 3 times daily in February for peripheral neuropathy of the bilateral feet and hands.  When she was seen prior to a 5th cycle of TCHP, she had worsening neuropathy with difficulty ambulating and was in a wheelchair. Due to this, we discontinued docetaxel.  INTERVAL HISTORY:  Brittney Burgess is here today for evaluation prior to next cycle of maintenance HER2 targeted therapy which is due 07/22/2020. She continues with radiation therapy.Today her weight has improved. She states her appetite is better. She does feel weak today. She complains of pain to the left breast at the site of radiation. She denies fever, chills, nausea or vomiting. She denies shortness or breath, chest pain or cough. She denies issue with bowel or bladder.   REVIEW OF SYSTEMS:  Review of Systems  Constitutional:  Positive for fatigue. Negative for chills, diaphoresis and fever.  HENT:   Negative for hearing loss, lump/mass, mouth sores, nosebleeds, sore throat, tinnitus, trouble swallowing and voice change.   Eyes:  Negative for eye problems and icterus.  Respiratory:  Negative for chest tightness, cough, hemoptysis, shortness of breath and wheezing.   Cardiovascular:  Negative for chest pain, leg swelling and palpitations.  Gastrointestinal:  Negative for abdominal distention, abdominal pain, blood in stool, constipation, diarrhea, nausea, rectal pain and vomiting.  Endocrine: Negative for hot flashes.  Genitourinary:  Negative  for bladder incontinence, difficulty urinating, dyspareunia, dysuria, frequency, hematuria and nocturia.    Musculoskeletal:  Negative for arthralgias, back pain, flank pain, gait problem, myalgias, neck pain and neck stiffness.  Skin:  Negative for itching, rash and wound.  Neurological:  Positive for extremity weakness and numbness. Negative for dizziness, gait problem, headaches, light-headedness, seizures and speech difficulty.  Hematological:  Negative for adenopathy. Does not bruise/bleed easily.  Psychiatric/Behavioral:  Negative for confusion, decreased concentration, depression, sleep disturbance and suicidal ideas. The patient is not nervous/anxious.    VITALS:  Blood pressure 132/77, pulse 91, temperature 97.7 F (36.5 C), temperature source Oral, resp. rate 18, height '5\' 9"'  (1.753 m), weight 155 lb 11.2 oz (70.6 kg), SpO2 98 %.  Wt Readings from Last 3 Encounters:  07/16/20 155 lb 11.2 oz (70.6 kg)  07/01/20 152 lb 8 oz (69.2 kg)  06/25/20 152 lb 14.4 oz (69.4 kg)    Body mass index is 22.99 kg/m.  Performance status (ECOG): 3 - Symptomatic, >50% confined to bed  PHYSICAL EXAM:  Physical Exam Constitutional:      General: She is not in acute distress.    Appearance: Normal appearance. She is normal weight. She is not ill-appearing, toxic-appearing or diaphoretic.  HENT:     Head: Normocephalic and atraumatic.     Nose: Nose normal. No congestion or rhinorrhea.     Mouth/Throat:     Mouth: Mucous membranes are moist.     Pharynx: Oropharynx is clear. No oropharyngeal exudate or posterior oropharyngeal erythema.  Eyes:     General: No scleral icterus.       Right eye: No discharge.        Left eye: No discharge.     Extraocular Movements: Extraocular movements intact.     Conjunctiva/sclera: Conjunctivae normal.     Pupils: Pupils are equal, round, and reactive to light.  Neck:     Vascular: No carotid bruit.  Cardiovascular:     Rate and Rhythm: Normal rate and regular rhythm.     Heart sounds: No murmur heard.   No friction rub. No gallop.  Pulmonary:     Effort:  Pulmonary effort is normal. No respiratory distress.     Breath sounds: Normal breath sounds. No stridor. No wheezing, rhonchi or rales.  Chest:     Chest wall: No tenderness.     Comments: Deferred breast exam today Abdominal:     General: Abdomen is flat. Bowel sounds are normal. There is no distension.     Palpations: There is no mass.     Tenderness: There is no abdominal tenderness. There is no right CVA tenderness, left CVA tenderness, guarding or rebound.     Hernia: No hernia is present.  Musculoskeletal:        General: No swelling, tenderness, deformity or signs of injury. Normal range of motion.     Cervical back: Normal range of motion and neck supple. No rigidity or tenderness.     Right lower leg: No edema.     Left lower leg: No edema.  Lymphadenopathy:     Cervical: No cervical adenopathy.  Skin:    General: Skin is warm and dry.     Capillary Refill: Capillary refill takes less than 2 seconds.     Coloration: Skin is not jaundiced or pale.     Findings: No bruising, erythema, lesion or rash.  Neurological:     General: No focal deficit present.     Mental Status: She  is alert and oriented to person, place, and time. Mental status is at baseline.     Cranial Nerves: No cranial nerve deficit.     Sensory: No sensory deficit.     Motor: No weakness.     Coordination: Coordination normal.     Gait: Gait normal.     Deep Tendon Reflexes: Reflexes normal.  Psychiatric:        Mood and Affect: Mood normal.        Behavior: Behavior normal.        Thought Content: Thought content normal.        Judgment: Judgment normal.   LABS:   CBC Latest Ref Rng & Units 07/16/2020 06/25/2020 06/04/2020  WBC - 6.3 7.2 3.8  Hemoglobin 12.0 - 16.0 12.4 12.8 11.3(A)  Hematocrit 36 - 46 37 38 34(A)  Platelets 150 - 399 167 218 127(A)   CMP Latest Ref Rng & Units 07/16/2020 06/25/2020 06/04/2020  BUN 4 - '21 15 20 20  ' Creatinine 0.5 - 1.1 0.8 1.1 0.9  Sodium 137 - 147 138 143 141   Potassium 3.4 - 5.3 3.4 3.0(A) 3.5  Chloride 99 - 108 106 104 108  CO2 13 - 22 24(A) 28(A) 27(A)  Calcium 8.7 - 10.7 9.8 9.8 9.5  Alkaline Phos 25 - 125 78 114 118  AST 13 - 35 '17 17 18  ' ALT 7 - 35 '11 10 17     ' No results found for: CEA1 / No results found for: CEA1 No results found for: PSA1 No results found for: WSF681 No results found for: CAN125  No results found for: TOTALPROTELP, ALBUMINELP, A1GS, A2GS, BETS, BETA2SER, GAMS, MSPIKE, SPEI No results found for: TIBC, FERRITIN, IRONPCTSAT No results found for: LDH  STUDIES:  No results found.    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 She has a 32.00 pack-year smoking history. She has never used smokeless tobacco. She reports that she does not drink alcohol and does not use drugs.The patient is alone today.  Allergies:  Allergies  Allergen Reactions   Metformin And Related Other (See Comments)    Twitching, nausea    Current Medications: Current Outpatient Medications  Medication Sig Dispense Refill   amLODipine (NORVASC) 5 MG tablet Take 1 tablet (5 mg total) by mouth daily. 30 tablet 0   COVID-19 mRNA vaccine, Moderna, 100 MCG/0.5ML injection USE AS DIRECTED .25 mL 0   dexamethasone (DECADRON) 4 MG tablet TAKE 2 TABLETS BY MOUTH TWICE A DAY THE DAY BEFORE CHEMO. AND FOR 2 DAYS AFTER 60 tablet 0   gabapentin (NEURONTIN) 300 MG  capsule Take 1 capsule (300 mg total) by mouth 2 (two) times daily. (Patient taking differently: Take 300 mg by mouth 2 (two) times daily. 2 in the morning and  1 in the afternoon before bed) 60 capsule 0   hydrochlorothiazide (HYDRODIURIL) 25 MG tablet TAKE 1 TABLET (25 MG TOTAL) BY MOUTH DAILY. 30 tablet 0   insulin glargine (LANTUS) 100 UNIT/ML Solostar Pen Inject into the skin. 10 units in the morning and 40 units at bedtime     JANUVIA 100 MG tablet Take 100 mg by mouth daily.     KLOR-CON M20 20 MEQ tablet TAKE 1 TABLET BY MOUTH TWICE A DAY 60 tablet 2   loratadine (CLARITIN) 10 MG tablet Take 10 mg by mouth daily.     losartan (COZAAR) 50 MG tablet TAKE 1 TABLET BY MOUTH EVERY DAY 30 tablet 1   nicotine polacrilex (NICORETTE) 2 MG gum Chew 1 piece in mouth every two hours as directed every 1-2 hours when you have urge to smoke, max 24 pieces per day     omeprazole (PRILOSEC) 40 MG capsule Take 1 capsule (40 mg total) by mouth daily. 60 capsule 3   ondansetron (ZOFRAN) 4 MG tablet Take 1 tablet (4 mg total) by mouth every 4 (four) hours as needed for nausea. 90 tablet 3   oxyCODONE (OXY IR/ROXICODONE) 5 MG immediate release tablet SMARTSIG:1 By Mouth 4-5 Times Daily     oxyCODONE (OXY IR/ROXICODONE) 5 MG immediate release tablet Take 1 tablet (5 mg total) by mouth every 4 (four) hours as needed for severe pain. 90 tablet 0   prochlorperazine (COMPAZINE) 10 MG tablet Take 1 tablet (10 mg total) by mouth every 6 (six) hours as needed for nausea or vomiting. 90 tablet 3   No current facility-administered medications for this visit.     ASSESSMENT & PLAN:   Assessment:  1. 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 She completed chemotherapy at the end of April.  We will continue with maintenance tastuzumab/pertuzumab every 3 weeks for a total of 1 year of HER2 targeted therapy. She will also begin adjuvant radiation therapy. She has  already met with Dr. Orlene Erm and will begin simulation next week. She continues with radiation therapy. She continues maintenance HER2 targeted therapy.    2.  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 She has persistent numbness, but improvement in her weakness in only occasional pain. Due to the numbness, she is at increased risk for falls. She continues to have increased neuropathy despite gabapentin. We will increase to twice daily.  3. Hypokalemia. We will increase to twice daily x one week and repeat level. This has improved.   4. Weight loss. This has improved    Plan:  She will continue with maintenance trastuzumab/ pertuzumab every 3 weeks for a total of 1 year. She is scheduled for Echocardiogram tomorrow. Her weight has improved. She has pain at the left breast radiation site. I will send in pain meds today. She will continue with daily potassium. She will return in 3 weeks for re-evaluation.   She verbalizes understanding of and agreement to the plans discussed today. She knows to call the office should any new questions or concerns arise.   [name], NP

## 2020-07-16 ENCOUNTER — Encounter: Payer: Self-pay | Admitting: Hematology and Oncology

## 2020-07-16 ENCOUNTER — Inpatient Hospital Stay (INDEPENDENT_AMBULATORY_CARE_PROVIDER_SITE_OTHER): Payer: PRIVATE HEALTH INSURANCE | Admitting: Hematology and Oncology

## 2020-07-16 ENCOUNTER — Inpatient Hospital Stay: Payer: PRIVATE HEALTH INSURANCE

## 2020-07-16 ENCOUNTER — Telehealth: Payer: Self-pay | Admitting: Hematology and Oncology

## 2020-07-16 VITALS — BP 132/77 | HR 91 | Temp 97.7°F | Resp 18 | Ht 69.0 in | Wt 155.7 lb

## 2020-07-16 DIAGNOSIS — C50512 Malignant neoplasm of lower-outer quadrant of left female breast: Secondary | ICD-10-CM | POA: Diagnosis not present

## 2020-07-16 DIAGNOSIS — Z171 Estrogen receptor negative status [ER-]: Secondary | ICD-10-CM | POA: Diagnosis not present

## 2020-07-16 LAB — CBC AND DIFFERENTIAL
HCT: 37 (ref 36–46)
Hemoglobin: 12.4 (ref 12.0–16.0)
Neutrophils Absolute: 5.23
Platelets: 167 (ref 150–399)
WBC: 6.3

## 2020-07-16 LAB — COMPREHENSIVE METABOLIC PANEL
Albumin: 4.3 (ref 3.5–5.0)
Calcium: 9.8 (ref 8.7–10.7)

## 2020-07-16 LAB — HEPATIC FUNCTION PANEL
ALT: 11 (ref 7–35)
AST: 17 (ref 13–35)
Alkaline Phosphatase: 78 (ref 25–125)
Bilirubin, Total: 0.5

## 2020-07-16 LAB — CBC: RBC: 4.09 (ref 3.87–5.11)

## 2020-07-16 LAB — BASIC METABOLIC PANEL
BUN: 15 (ref 4–21)
CO2: 24 — AB (ref 13–22)
Chloride: 106 (ref 99–108)
Creatinine: 0.8 (ref 0.5–1.1)
Glucose: 82
Potassium: 3.4 (ref 3.4–5.3)
Sodium: 138 (ref 137–147)

## 2020-07-16 MED ORDER — OXYCODONE HCL 5 MG PO TABS: 5.0000 mg | ORAL_TABLET | ORAL | 0 refills | Status: DC | PRN

## 2020-07-16 NOTE — Telephone Encounter (Signed)
Per 6/16 los next appt scheduled and confirmed by patient 

## 2020-07-17 DIAGNOSIS — I517 Cardiomegaly: Secondary | ICD-10-CM | POA: Diagnosis not present

## 2020-07-21 ENCOUNTER — Encounter: Payer: Self-pay | Admitting: Oncology

## 2020-07-22 ENCOUNTER — Other Ambulatory Visit: Payer: Self-pay

## 2020-07-22 ENCOUNTER — Inpatient Hospital Stay: Payer: PRIVATE HEALTH INSURANCE

## 2020-07-22 VITALS — BP 145/75 | HR 96 | Temp 98.4°F | Resp 16 | Ht 69.0 in | Wt 158.0 lb

## 2020-07-22 DIAGNOSIS — Z5112 Encounter for antineoplastic immunotherapy: Secondary | ICD-10-CM | POA: Diagnosis not present

## 2020-07-22 DIAGNOSIS — C50512 Malignant neoplasm of lower-outer quadrant of left female breast: Secondary | ICD-10-CM

## 2020-07-22 MED ORDER — HEPARIN SOD (PORK) LOCK FLUSH 100 UNIT/ML IV SOLN
500.0000 [IU] | Freq: Once | INTRAVENOUS | Status: AC | PRN
  Administered 2020-07-22: 500 [IU]
  Filled 2020-07-22: qty 5

## 2020-07-22 MED ORDER — SODIUM CHLORIDE 0.9 % IV SOLN
Freq: Once | INTRAVENOUS | Status: AC
  Filled 2020-07-22: qty 250

## 2020-07-22 MED ORDER — ACETAMINOPHEN 325 MG PO TABS: 650.0000 mg | ORAL_TABLET | Freq: Once | ORAL | Status: DC

## 2020-07-22 MED ORDER — SODIUM CHLORIDE 0.9 % IV SOLN
420.0000 mg | Freq: Once | INTRAVENOUS | Status: AC
  Administered 2020-07-22: 420 mg via INTRAVENOUS
  Filled 2020-07-22: qty 14

## 2020-07-22 MED ORDER — DIPHENHYDRAMINE HCL 50 MG/ML IJ SOLN: 25.0000 mg | Freq: Once | INTRAMUSCULAR | Status: DC

## 2020-07-22 MED ORDER — TRASTUZUMAB-ANNS CHEMO 150 MG IV SOLR
6.0000 mg/kg | Freq: Once | INTRAVENOUS | Status: AC
  Administered 2020-07-22: 420 mg via INTRAVENOUS
  Filled 2020-07-22: qty 20

## 2020-07-22 NOTE — Patient Instructions (Signed)
Brittney Burgess  Discharge Instructions: Thank you for choosing [facility] to provide your oncology and hematology care.  If you have a lab appointment with the [facility], please go directly to the [facility] and check in at the registration area.   Wear comfortable clothing and clothing appropriate for easy access to any Portacath or PICC line.   We strive to give you quality time with your provider. You may need to reschedule your appointment if you arrive late (15 or more minutes).  Arriving late affects you and other patients whose appointments are after yours.  Also, if you miss three or more appointments without notifying the office, you may be dismissed from the clinic at the provider's discretion.      For prescription refill requests, have your pharmacy contact our office and allow 72 hours for refills to be completed.    Today you received the following chemotherapy and/or immunotherapy agents Trastuzumab and pertuzumab     To help prevent nausea and vomiting after your treatment, we encourage you to take your nausea medication as directed.  BELOW ARE SYMPTOMS THAT SHOULD BE REPORTED IMMEDIATELY: *FEVER GREATER THAN 100.4 F (38 C) OR HIGHER *CHILLS OR SWEATING *NAUSEA AND VOMITING THAT IS NOT CONTROLLED WITH YOUR NAUSEA MEDICATION *UNUSUAL SHORTNESS OF BREATH *UNUSUAL BRUISING OR BLEEDING *URINARY PROBLEMS (pain or burning when urinating, or frequent urination) *BOWEL PROBLEMS (unusual diarrhea, constipation, pain near the anus) TENDERNESS IN MOUTH AND THROAT WITH OR WITHOUT PRESENCE OF ULCERS (sore throat, sores in mouth, or a toothache) UNUSUAL RASH, SWELLING OR PAIN  UNUSUAL VAGINAL DISCHARGE OR ITCHING   Items with * indicate a potential emergency and should be followed up as soon as possible or go to the Emergency Department if any problems should occur.  Please show the CHEMOTHERAPY ALERT CARD or IMMUNOTHERAPY ALERT CARD at  check-in to the Emergency Department and triage nurse.  Should you have questions after your visit or need to cancel or reschedule your appointment, please contact [facility]  Dept: [phone]  and follow the prompts.  Office hours are 8:00 a.m. to 4:30 p.m. Monday - Friday. Please note that voicemails left after 4:00 p.m. may not be returned until the following business day.  We are closed weekends and major holidays. You have access to a nurse at all times for urgent questions. Please call the main number to the clinic Dept: [phone] and follow the prompts.  For any non-urgent questions, you may also contact your provider using MyChart. We now offer e-Visits for anyone 17 and older to request care online for non-urgent symptoms. For details visit [email or web address].   Also download the MyChart app! Go to the app store, search "MyChart", open the app, select [facility], and log in with your MyChart username and password.  Due to Covid, a mask is required upon entering the hospital/clinic. If you do not have a mask, one will be given to you upon arrival. For doctor visits, patients may have 1 support person aged 67 or older with them. For treatment visits, patients cannot have anyone with them due to current Covid guidelines and our immunocompromised population.

## 2020-07-28 DIAGNOSIS — G620 Drug-induced polyneuropathy: Secondary | ICD-10-CM

## 2020-07-28 HISTORY — DX: Drug-induced polyneuropathy: G62.0

## 2020-08-01 ENCOUNTER — Other Ambulatory Visit: Payer: PRIVATE HEALTH INSURANCE | Admitting: Hematology and Oncology

## 2020-08-01 DIAGNOSIS — I10 Essential (primary) hypertension: Secondary | ICD-10-CM

## 2020-08-04 NOTE — Telephone Encounter (Signed)
I called [facility] and asked them to send this to Carolynne Edouard - PCP from [facility]

## 2020-08-05 NOTE — Progress Notes (Signed)
[facility]  [address] [place],  [place]  [address] [phone]  Clinic Day:  08/06/2020  Referring physician: Carolynne Edouard, MD   CHIEF COMPLAINT:  CC: A 60 year old female with history of Stage IB HER2 receptor positive breast cancer here for 3 week evaluation  Current Treatment:  Maintenance trastuzumab/pertuzumab every 3 weeks   HISTORY OF PRESENT ILLNESS:  Brittney Burgess is a 60 y.o. 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  Screening mammogram in August which revealed a mass with calcifications, a second asymmetry and possible dilated ducts within the left breast.  Right breast was negative.  Diagnostic unilateral left mammogram and left breast ultrasound confirmed a 1.6 cm elongated mass containing pleomorphic calcifications at 7 o'clock, a 5 mm hypoechoic indeterminate mass containing 2 benign-appearing calcification at 2 o'clock, and several retroareolar ecstatic ducts containing internal debris or possible tissue.  Ultrasound guided biopsy in September of the mass at 5 o'clock revealed grade 3, invasive ductal carcinoma, grade 3.  Estrogen and progesterone receptors were negative with HER2 positive.  Ki67 was 50%. 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  Biopsy of the mass at 2 o'clock was consistent with fibrocystic changes, including sclerosing adenosis with calcifications.  No atypia, in situ or invasive malignancy. Biopsy at 11:30 o'clock revealed a fragmented intraductal papilloma and dilated duct.  No atypia, in situ or invasive malignancy identified.  She was treated with lumpectomy and sentinel lymph node biopsy in November.  Surgical pathology from this procedure revealed a 1.2 cm, grade 3, invasive ductal carcinoma with high grade ductal carcinoma in situ.  One sentinel lymph node was negative for malignancy.  Margins were negative.  She also had  placement of a port at that time.  She had a hysterectomy and bilateral salpingo oophorectomy in 2004 due to menorrhagia.  She is unsure if she was given hormone replacement.  She has baseline neuropathy of her feet secondary to diabetes.  She started adjuvant chemotherapy/HER2 targeted therapy with docetaxel/carboplatin/trastuzumab/pertuzumab (TCHP) in December. She was placed on gabapentin 300 mg 3 times daily in February for peripheral neuropathy of the bilateral feet and hands.  When she was seen prior to a 5th cycle of TCHP, she had worsening neuropathy with difficulty ambulating and was in a wheelchair. Due to this, we discontinued docetaxel.  INTERVAL HISTORY:  [name] is here today for evaluation prior to next cycle of maintenance HER2 targeted therapy. She is doing well. She continues to have some skin discomfort to her left axilla and breast from radiation, but this appears to be healing and she states the cream she was given is helping. She is using her pain med of oxycodone 10 mg every 4 hours prn which is also helpful. She denies fever, chills, nausea or vomiting. She denies issue with bowel or bladder. She denies shortness of breath, chest pain or cough. CBC and CMP today are unremarkable.  REVIEW OF SYSTEMS:  Review of Systems  Constitutional:  Positive for fatigue. Negative for chills, diaphoresis and fever.  HENT:   Negative for hearing loss, lump/mass, mouth sores, nosebleeds, sore throat, tinnitus, trouble swallowing and voice change.   Eyes:  Negative for eye problems and icterus.  Respiratory:  Negative for chest tightness, cough, hemoptysis, shortness of breath and wheezing.   Cardiovascular:  Negative for chest pain, leg swelling and palpitations.  Gastrointestinal:  Negative for abdominal distention, abdominal  pain, blood in stool, constipation, diarrhea, nausea, rectal pain and vomiting.  Endocrine: Negative for hot flashes.  Genitourinary:  Negative for bladder incontinence,  difficulty urinating, dyspareunia, dysuria, frequency, hematuria and nocturia.   Musculoskeletal:  Negative for arthralgias, back pain, flank pain, gait problem, myalgias, neck pain and neck stiffness.  Skin:  Negative for itching, rash and wound.  Neurological:  Positive for extremity weakness and numbness. Negative for dizziness, gait problem, headaches, light-headedness, seizures and speech difficulty.  Hematological:  Negative for adenopathy. Does not bruise/bleed easily.  Psychiatric/Behavioral:  Negative for confusion, decreased concentration, depression, sleep disturbance and suicidal ideas. The patient is not nervous/anxious.    VITALS:  Blood pressure (!) 149/76, pulse 92, temperature 98.6 F (37 C), temperature source Oral, resp. rate 18, height _0  (1.753 m), weight 154 lb 9.6 oz (70.1 kg), SpO2 99 %.  Wt Readings from Last 3 Encounters:  08/06/20 154 lb 9.6 oz (70.1 kg)  07/22/20 158 lb (71.7 kg)  07/16/20 155 lb 11.2 oz (70.6 kg)    Body mass index is 22.83 kg/m.  Performance status (ECOG): 3 - Symptomatic, >50% confined to bed  PHYSICAL EXAM:  Physical Exam Constitutional:      General: She is not in acute distress.    Appearance: Normal appearance. She is normal weight. She is not ill-appearing, toxic-appearing or diaphoretic.  HENT:     Head: Normocephalic and atraumatic.     Nose: Nose normal. No congestion or rhinorrhea.     Mouth/Throat:     Mouth: Mucous membranes are moist.     Pharynx: Oropharynx is clear. No oropharyngeal exudate or posterior oropharyngeal erythema.  Eyes:     General: No scleral icterus.       Right eye: No discharge.        Left eye: No discharge.     Extraocular Movements: Extraocular movements intact.     Conjunctiva/sclera: Conjunctivae normal.     Pupils: Pupils are equal, round, and reactive to light.  Neck:     Vascular: No carotid bruit.  Cardiovascular:     Rate and Rhythm: Normal rate and regular rhythm.     Heart sounds:  No murmur heard.   No friction rub. No gallop.  Pulmonary:     Effort: Pulmonary effort is normal. No respiratory distress.     Breath sounds: Normal breath sounds. No stridor. No wheezing, rhonchi or rales.  Chest:     Chest wall: No tenderness.     Comments: Deferred breast exam today Abdominal:     General: Abdomen is flat. Bowel sounds are normal. There is no distension.     Palpations: There is no mass.     Tenderness: There is no abdominal tenderness. There is no right CVA tenderness, left CVA tenderness, guarding or rebound.     Hernia: No hernia is present.  Musculoskeletal:        General: No swelling, tenderness, deformity or signs of injury. Normal range of motion.     Cervical back: Normal range of motion and neck supple. No rigidity or tenderness.     Right lower leg: No edema.     Left lower leg: No edema.  Lymphadenopathy:     Cervical: No cervical adenopathy.  Skin:    General: Skin is warm and dry.     Capillary Refill: Capillary refill takes less than 2 seconds.     Coloration: Skin is not jaundiced or pale.     Findings: No bruising, erythema, lesion  or rash.  Neurological:     General: No focal deficit present.     Mental Status: She is alert and oriented to person, place, and time. Mental status is at baseline.     Cranial Nerves: No cranial nerve deficit.     Sensory: No sensory deficit.     Motor: No weakness.     Coordination: Coordination normal.     Gait: Gait normal.     Deep Tendon Reflexes: Reflexes normal.  Psychiatric:        Mood and Affect: Mood normal.        Behavior: Behavior normal.        Thought Content: Thought content normal.        Judgment: Judgment normal.   LABS:   CBC Latest Ref Rng & Units 08/06/2020 07/16/2020 06/25/2020  WBC - 6.4 6.3 7.2  Hemoglobin 12.0 - 16.0 11.9(A) 12.4 12.8  Hematocrit 36 - 46 35(A) 37 38  Platelets 150 - 399 217 167 218   CMP Latest Ref Rng & Units 08/06/2020 07/16/2020 07/15/2020  BUN 4 - 21 17 - 15   Creatinine 0.5 - 1.1 0.8 - 0.8  Sodium 137 - 147 142 - 138  Potassium 3.4 - 5.3 3.6 - 3.4  Chloride 99 - 108 104 - 106  CO2 13 - 22 27(A) - 24(A)  Calcium 8.7 - 10.7 10.0 9.8 -  Alkaline Phos 25 - 125 103 78 -  AST 13 - 35 18 17 -  ALT 7 - 35 10 11 -     No results found for: CEA1 / No results found for: CEA1 No results found for: PSA1 No results found for: BZM080 No results found for: EMV361  No results found for: TOTALPROTELP, ALBUMINELP, A1GS, A2GS, BETS, BETA2SER, GAMS, MSPIKE, SPEI No results found for: TIBC, FERRITIN, IRONPCTSAT No results found for: LDH  STUDIES:  No results found.    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 She has a 32.00 pack-year smoking history. She has never used smokeless tobacco. She reports that she does not drink alcohol and does not use drugs.The patient is alone today.  Allergies:  Allergies  Allergen Reactions   Metformin And Related Other (See Comments)    Twitching, nausea    Current Medications: Current Outpatient Medications  Medication Sig Dispense Refill   amLODipine (NORVASC) 5 MG tablet Take 1 tablet (5 mg total) by mouth daily. 30 tablet 0   COVID-19 mRNA vaccine, Moderna, 100 MCG/0.5ML injection USE AS DIRECTED .25 mL 0   dexamethasone (DECADRON) 4 MG tablet TAKE 2 TABLETS BY MOUTH TWICE A DAY THE DAY BEFORE CHEMO. AND FOR 2 DAYS  AFTER 60 tablet 0   gabapentin (NEURONTIN) 300 MG capsule Take 1 capsule (300 mg total) by mouth 2 (  two) times daily. (Patient taking differently: Take 300 mg by mouth 2 (two) times daily. 2 in the morning and  1 in the afternoon before bed) 60 capsule 0   hydrochlorothiazide (HYDRODIURIL) 25 MG tablet TAKE 1 TABLET (25 MG TOTAL) BY MOUTH DAILY. 30 tablet 0   insulin glargine (LANTUS) 100 UNIT/ML Solostar Pen Inject into the skin. 10 units in the morning and 40 units at bedtime     JANUVIA 100 MG tablet Take 100 mg by mouth daily.     KLOR-CON M20 20 MEQ tablet TAKE 1 TABLET BY MOUTH TWICE A DAY 60 tablet 2   loratadine (CLARITIN) 10 MG tablet Take 10 mg by mouth daily.     losartan (COZAAR) 50 MG tablet TAKE 1 TABLET BY MOUTH EVERY DAY 30 tablet 1   nicotine polacrilex (NICORETTE) 2 MG gum Chew 1 piece in mouth every two hours as directed every 1-2 hours when you have urge to smoke, max 24 pieces per day     omeprazole (PRILOSEC) 40 MG capsule Take 1 capsule (40 mg total) by mouth daily. 60 capsule 3   ondansetron (ZOFRAN) 4 MG tablet Take 1 tablet (4 mg total) by mouth every 4 (four) hours as needed for nausea. 90 tablet 3   oxyCODONE (OXY IR/ROXICODONE) 5 MG immediate release tablet SMARTSIG:1 By Mouth 4-5 Times Daily     oxyCODONE (OXY IR/ROXICODONE) 5 MG immediate release tablet Take 1 tablet (5 mg total) by mouth every 4 (four) hours as needed for severe pain. 90 tablet 0   prochlorperazine (COMPAZINE) 10 MG tablet Take 1 tablet (10 mg total) by mouth every 6 (six) hours as needed for nausea or vomiting. 90 tablet 3   No current facility-administered medications for this visit.     ASSESSMENT & PLAN:   Assessment:  1. 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 She completed chemotherapy at the end of April.  We will continue with maintenance tastuzumab/pertuzumab every 3 weeks for a total of 1 year of HER2 targeted therapy. She will  also begin adjuvant radiation therapy. She has already met with Dr. Orlene Erm and will begin simulation next week. She continues with radiation therapy. She continues maintenance HER2 targeted therapy. Her next cycle is due 07/13.    2.  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 She has persistent numbness, but improvement in her weakness in only occasional pain. Due to the numbness, she is at increased risk for falls. This has improved by increasing her doses to twice daily.  3. Hypokalemia. We will increase to twice daily x one week and repeat level. This has improved.   4. Weight loss. This has improved    Plan:  She will continue with maintenance trastuzumab/ pertuzumab every 3 weeks for a total of 1 year.Echocardiogram was normal. I will refill pain meds today. She will return to clinic in 3 weeks for repeat evaluation.   She verbalizes understanding of and agreement to the plans discussed today. She knows to call the office should any new questions or concerns arise.   [name], NP

## 2020-08-06 ENCOUNTER — Inpatient Hospital Stay: Payer: PRIVATE HEALTH INSURANCE

## 2020-08-06 ENCOUNTER — Telehealth: Payer: Self-pay | Admitting: Hematology and Oncology

## 2020-08-06 ENCOUNTER — Other Ambulatory Visit: Payer: Self-pay | Admitting: Hematology and Oncology

## 2020-08-06 ENCOUNTER — Inpatient Hospital Stay: Payer: PRIVATE HEALTH INSURANCE | Attending: Hematology and Oncology | Admitting: Hematology and Oncology

## 2020-08-06 ENCOUNTER — Other Ambulatory Visit: Payer: Self-pay

## 2020-08-06 ENCOUNTER — Encounter: Payer: Self-pay | Admitting: Hematology and Oncology

## 2020-08-06 VITALS — BP 149/76 | HR 92 | Temp 98.6°F | Resp 18 | Ht 69.0 in | Wt 154.6 lb

## 2020-08-06 DIAGNOSIS — Z171 Estrogen receptor negative status [ER-]: Secondary | ICD-10-CM | POA: Diagnosis not present

## 2020-08-06 DIAGNOSIS — C50512 Malignant neoplasm of lower-outer quadrant of left female breast: Secondary | ICD-10-CM | POA: Insufficient documentation

## 2020-08-06 DIAGNOSIS — Z5112 Encounter for antineoplastic immunotherapy: Secondary | ICD-10-CM | POA: Insufficient documentation

## 2020-08-06 DIAGNOSIS — Z79899 Other long term (current) drug therapy: Secondary | ICD-10-CM | POA: Insufficient documentation

## 2020-08-06 DIAGNOSIS — Z170 Estrogen receptor positive status [ER+]: Secondary | ICD-10-CM | POA: Insufficient documentation

## 2020-08-06 DIAGNOSIS — Z794 Long term (current) use of insulin: Secondary | ICD-10-CM | POA: Insufficient documentation

## 2020-08-06 DIAGNOSIS — E1142 Type 2 diabetes mellitus with diabetic polyneuropathy: Secondary | ICD-10-CM | POA: Insufficient documentation

## 2020-08-06 DIAGNOSIS — Z923 Personal history of irradiation: Secondary | ICD-10-CM | POA: Insufficient documentation

## 2020-08-06 LAB — BASIC METABOLIC PANEL
BUN: 17 (ref 4–21)
CO2: 27 — AB (ref 13–22)
Chloride: 104 (ref 99–108)
Creatinine: 0.8 (ref 0.5–1.1)
Glucose: 86
Potassium: 3.6 (ref 3.4–5.3)
Sodium: 142 (ref 137–147)

## 2020-08-06 LAB — CBC AND DIFFERENTIAL
HCT: 35 — AB (ref 36–46)
Hemoglobin: 11.9 — AB (ref 12.0–16.0)
Neutrophils Absolute: 5.18
Platelets: 217 (ref 150–399)
WBC: 6.4

## 2020-08-06 LAB — MAGNESIUM: Magnesium: 2.1

## 2020-08-06 LAB — COMPREHENSIVE METABOLIC PANEL
Albumin: 4.5 (ref 3.5–5.0)
Calcium: 10 (ref 8.7–10.7)

## 2020-08-06 LAB — HEPATIC FUNCTION PANEL
ALT: 10 (ref 7–35)
AST: 18 (ref 13–35)
Alkaline Phosphatase: 103 (ref 25–125)
Bilirubin, Total: 0.5

## 2020-08-06 LAB — CBC: RBC: 3.98 (ref 3.87–5.11)

## 2020-08-06 MED ORDER — OXYCODONE HCL 5 MG PO TABS: 5.0000 mg | ORAL_TABLET | ORAL | 0 refills | Status: DC | PRN

## 2020-08-06 NOTE — Telephone Encounter (Signed)
Per 7/7 los next appt scheduled and given to patient 

## 2020-08-07 ENCOUNTER — Other Ambulatory Visit: Payer: Self-pay | Admitting: Hematology and Oncology

## 2020-08-07 DIAGNOSIS — R600 Localized edema: Secondary | ICD-10-CM

## 2020-08-12 ENCOUNTER — Inpatient Hospital Stay: Payer: PRIVATE HEALTH INSURANCE

## 2020-08-12 ENCOUNTER — Other Ambulatory Visit: Payer: Self-pay

## 2020-08-12 VITALS — BP 128/75 | HR 101 | Temp 98.1°F | Resp 18 | Ht 69.0 in | Wt 151.0 lb

## 2020-08-12 DIAGNOSIS — Z5112 Encounter for antineoplastic immunotherapy: Secondary | ICD-10-CM | POA: Diagnosis present

## 2020-08-12 DIAGNOSIS — Z79899 Other long term (current) drug therapy: Secondary | ICD-10-CM | POA: Diagnosis not present

## 2020-08-12 DIAGNOSIS — E1142 Type 2 diabetes mellitus with diabetic polyneuropathy: Secondary | ICD-10-CM | POA: Diagnosis not present

## 2020-08-12 DIAGNOSIS — Z794 Long term (current) use of insulin: Secondary | ICD-10-CM | POA: Diagnosis not present

## 2020-08-12 DIAGNOSIS — Z170 Estrogen receptor positive status [ER+]: Secondary | ICD-10-CM | POA: Diagnosis not present

## 2020-08-12 DIAGNOSIS — Z923 Personal history of irradiation: Secondary | ICD-10-CM | POA: Diagnosis not present

## 2020-08-12 DIAGNOSIS — Z171 Estrogen receptor negative status [ER-]: Secondary | ICD-10-CM

## 2020-08-12 DIAGNOSIS — C50512 Malignant neoplasm of lower-outer quadrant of left female breast: Secondary | ICD-10-CM | POA: Diagnosis present

## 2020-08-12 MED ORDER — SODIUM CHLORIDE 0.9% FLUSH
10.0000 mL | INTRAVENOUS | Status: DC | PRN
  Administered 2020-08-12: 10 mL
  Filled 2020-08-12: qty 10

## 2020-08-12 MED ORDER — HEPARIN SOD (PORK) LOCK FLUSH 100 UNIT/ML IV SOLN
500.0000 [IU] | Freq: Once | INTRAVENOUS | Status: AC | PRN
  Administered 2020-08-12: 500 [IU]
  Filled 2020-08-12: qty 5

## 2020-08-12 MED ORDER — SODIUM CHLORIDE 0.9 % IV SOLN
420.0000 mg | Freq: Once | INTRAVENOUS | Status: AC
  Administered 2020-08-12: 420 mg via INTRAVENOUS
  Filled 2020-08-12: qty 14

## 2020-08-12 MED ORDER — DIPHENHYDRAMINE HCL 50 MG/ML IJ SOLN: 25.0000 mg | Freq: Once | INTRAMUSCULAR | Status: DC

## 2020-08-12 MED ORDER — SODIUM CHLORIDE 0.9 % IV SOLN
Freq: Once | INTRAVENOUS | Status: AC
  Filled 2020-08-12: qty 250

## 2020-08-12 MED ORDER — TRASTUZUMAB-ANNS CHEMO 150 MG IV SOLR
6.0000 mg/kg | Freq: Once | INTRAVENOUS | Status: AC
  Administered 2020-08-12: 420 mg via INTRAVENOUS
  Filled 2020-08-12: qty 20

## 2020-08-12 MED ORDER — ACETAMINOPHEN 325 MG PO TABS: 650.0000 mg | ORAL_TABLET | Freq: Once | ORAL | Status: DC

## 2020-08-12 NOTE — Patient Instructions (Signed)
[facility]  Discharge Instructions: Thank you for choosing [facility] to provide your oncology and hematology care.  If you have a lab appointment with the [facility], please go directly to the [facility] and check in at the registration area.   Wear comfortable clothing and clothing appropriate for easy access to any Portacath or PICC line.   We strive to give you quality time with your provider. You may need to reschedule your appointment if you arrive late (15 or more minutes).  Arriving late affects you and other patients whose appointments are after yours.  Also, if you miss three or more appointments without notifying the office, you may be dismissed from the clinic at the provider's discretion.      For prescription refill requests, have your pharmacy contact our office and allow 72 hours for refills to be completed.    Today you received the following chemotherapy and/or immunotherapy agents kanjiniti/perjeta   To help prevent nausea and vomiting after your treatment, we encourage you to take your nausea medication as directed.  BELOW ARE SYMPTOMS THAT SHOULD BE REPORTED IMMEDIATELY: *FEVER GREATER THAN 100.4 F (38 C) OR HIGHER *CHILLS OR SWEATING *NAUSEA AND VOMITING THAT IS NOT CONTROLLED WITH YOUR NAUSEA MEDICATION *UNUSUAL SHORTNESS OF BREATH *UNUSUAL BRUISING OR BLEEDING *URINARY PROBLEMS (pain or burning when urinating, or frequent urination) *BOWEL PROBLEMS (unusual diarrhea, constipation, pain near the anus) TENDERNESS IN MOUTH AND THROAT WITH OR WITHOUT PRESENCE OF ULCERS (sore throat, sores in mouth, or a toothache) UNUSUAL RASH, SWELLING OR PAIN  UNUSUAL VAGINAL DISCHARGE OR ITCHING   Items with * indicate a potential emergency and should be followed up as soon as possible or go to the Emergency Department if any problems should occur.  Please show the CHEMOTHERAPY ALERT CARD or IMMUNOTHERAPY ALERT CARD at check-in to the  Emergency Department and triage nurse.  Should you have questions after your visit or need to cancel or reschedule your appointment, please contact [facility]  Dept: [phone]  and follow the prompts.  Office hours are 8:00 a.m. to 4:30 p.m. Monday - Friday. Please note that voicemails left after 4:00 p.m. may not be returned until the following business day.  We are closed weekends and major holidays. You have access to a nurse at all times for urgent questions. Please call the main number to the clinic Dept: [phone] and follow the prompts.  For any non-urgent questions, you may also contact your provider using MyChart. We now offer e-Visits for anyone 8 and older to request care online for non-urgent symptoms. For details visit [email or web address].   Also download the MyChart app! Go to the app store, search "MyChart", open the app, select [facility], and log in with your MyChart username and password.  Due to Covid, a mask is required upon entering the hospital/clinic. If you do not have a mask, one will be given to you upon arrival. For doctor visits, patients may have 1 support person aged 22 or older with them. For treatment visits, patients cannot have anyone with them due to current Covid guidelines and our immunocompromised population.   Trastuzumab injection for infusion What is this medication? TRASTUZUMAB (tras TOO zoo mab) is a monoclonal antibody. It is used to treatbreast cancer and stomach cancer. This medicine may be used for other purposes; ask your health care provider orpharmacist if you have questions. COMMON BRAND NAME(S): Herceptin, Donnald Garre What  should I tell my care team before I take this medication? They need to know if you have any of these conditions: heart disease heart failure lung or breathing disease, like asthma an unusual or allergic reaction to trastuzumab, benzyl alcohol, or other  medications, foods, dyes, or preservatives pregnant or trying to get pregnant breast-feeding How should I use this medication? This drug is given as an infusion into a vein. It is administered in a hospitalor clinic by a specially trained health care professional. Talk to your pediatrician regarding the use of this medicine in children. Thismedicine is not approved for use in children. Overdosage: If you think you have taken too much of this medicine contact apoison control center or emergency room at once. NOTE: This medicine is only for you. Do not share this medicine with others. What if I miss a dose? It is important not to miss a dose. Call your doctor or health careprofessional if you are unable to keep an appointment. What may interact with this medication? This medicine may interact with the following medications: certain types of chemotherapy, such as daunorubicin, doxorubicin, epirubicin, and idarubicin This list may not describe all possible interactions. Give your health care provider a list of all the medicines, herbs, non-prescription drugs, or dietary supplements you use. Also tell them if you smoke, drink alcohol, or use illegaldrugs. Some items may interact with your medicine. What should I watch for while using this medication? Visit your doctor for checks on your progress. Report any side effects. Continue your course of treatment even though you feel ill unless your doctortells you to stop. Call your doctor or health care professional for advice if you get a fever, chills or sore throat, or other symptoms of a cold or flu. Do not treatyourself. Try to avoid being around people who are sick. You may experience fever, chills and shaking during your first infusion. These effects are usually mild and can be treated with other medicines. Report any side effects during the infusion to your health care professional. Fever andchills usually do not happen with later infusions. Do not  become pregnant while taking this medicine or for 7 months after stopping it. Women should inform their doctor if they wish to become pregnant or think they might be pregnant. Women of child-bearing potential will need to have a negative pregnancy test before starting this medicine. There is a potential for serious side effects to an unborn child. Talk to your health care professional or pharmacist for more information. Do not breast-feed an infantwhile taking this medicine or for 7 months after stopping it. Women must use effective birth control with this medicine. What side effects may I notice from receiving this medication? Side effects that you should report to your doctor or health care professionalas soon as possible: allergic reactions like skin rash, itching or hives, swelling of the face, lips, or tongue chest pain or palpitations cough dizziness feeling faint or lightheaded, falls fever general ill feeling or flu-like symptoms signs of worsening heart failure like breathing problems; swelling in your legs and feet unusually weak or tired Side effects that usually do not require medical attention (report to yourdoctor or health care professional if they continue or are bothersome): bone pain changes in taste diarrhea joint pain nausea/vomiting weight loss This list may not describe all possible side effects. Call your doctor for medical advice about side effects. You may report side effects to FDA at1-800-FDA-1088. Where should I keep my medication? This drug is given   in a hospital or clinic and will not be stored at home. NOTE: This sheet is a summary. It may not cover all possible information. If you have questions about this medicine, talk to your doctor, pharmacist, orhealth care provider.  2022 Elsevier/Gold Standard (2016-01-12 14:37:52) Pertuzumab injection What is this medication? PERTUZUMAB (per TOOZ ue mab) is a monoclonal antibody. It is used to treatbreast  cancer. This medicine may be used for other purposes; ask your health care provider orpharmacist if you have questions. COMMON BRAND NAME(S): PERJETA What should I tell my care team before I take this medication? They need to know if you have any of these conditions: heart disease heart failure high blood pressure history of irregular heart beat recent or ongoing radiation therapy an unusual or allergic reaction to pertuzumab, other medicines, foods, dyes, or preservatives pregnant or trying to get pregnant breast-feeding How should I use this medication? This medicine is for infusion into a vein. It is given by a health careprofessional in a hospital or clinic setting. Talk to your pediatrician regarding the use of this medicine in children.Special care may be needed. Overdosage: If you think you have taken too much of this medicine contact apoison control center or emergency room at once. NOTE: This medicine is only for you. Do not share this medicine with others. What if I miss a dose? It is important not to miss your dose. Call your doctor or health careprofessional if you are unable to keep an appointment. What may interact with this medication? Interactions are not expected. Give your health care provider a list of all the medicines, herbs, non-prescription drugs, or dietary supplements you use. Also tell them if you smoke, drink alcohol, or use illegal drugs. Some items may interact with yourmedicine. This list may not describe all possible interactions. Give your health care provider a list of all the medicines, herbs, non-prescription drugs, or dietary supplements you use. Also tell them if you smoke, drink alcohol, or use illegaldrugs. Some items may interact with your medicine. What should I watch for while using this medication? Your condition will be monitored carefully while you are receiving this medicine. Report any side effects. Continue your course of treatment eventhough  you feel ill unless your doctor tells you to stop. Do not become pregnant while taking this medicine or for 7 months after stopping it. Women should inform their doctor if they wish to become pregnant or think they might be pregnant. Women of child-bearing potential will need to have a negative pregnancy test before starting this medicine. There is a potential for serious side effects to an unborn child. Talk to your health care professional or pharmacist for more information. Do not breast-feed an infantwhile taking this medicine or for 7 months after stopping it. Women must use effective birth control with this medicine. Call your doctor or health care professional for advice if you get a fever, chills or sore throat, or other symptoms of a cold or flu. Do not treatyourself. Try to avoid being around people who are sick. You may experience fever, chills, and headache during the infusion. Report anyside effects during the infusion to your health care professional. What side effects may I notice from receiving this medication? Side effects that you should report to your doctor or health care professionalas soon as possible: breathing problems chest pain or palpitations dizziness feeling faint or lightheaded fever or chills skin rash, itching or hives sore throat swelling of the face, lips, or tongue swelling of  the legs or ankles unusually weak or tired Side effects that usually do not require medical attention (report to yourdoctor or health care professional if they continue or are bothersome): diarrhea hair loss nausea, vomiting tiredness This list may not describe all possible side effects. Call your doctor for medical advice about side effects. You may report side effects to FDA at1-800-FDA-1088. Where should I keep my medication? This drug is given in a hospital or clinic and will not be stored at home. NOTE: This sheet is a summary. It may not cover all possible information. If you  have questions about this medicine, talk to your doctor, pharmacist, orhealth care provider.  2022 Elsevier/Gold Standard (2015-02-19 12:08:50)

## 2020-08-17 ENCOUNTER — Encounter: Payer: Self-pay | Admitting: Hematology and Oncology

## 2020-08-17 ENCOUNTER — Encounter: Payer: Self-pay | Admitting: Oncology

## 2020-08-20 NOTE — Progress Notes (Signed)
[facility]  [address] [place],  [place]  [id number] [phone]  Clinic Day:  08/27/2020  Referring physician: Carolynne Edouard, MD  This document serves as a record of services personally performed by Hosie Poisson, MD. It was created on their behalf by Curry,Lauren E, a trained medical scribe. The creation of this record is based on the scribe's personal observations and the provider's statements to them.  CHIEF COMPLAINT:  CC: Stage IB HER2 receptor positive breast cancer   Current Treatment:  Maintenance trastuzumab/pertuzumab every 3 weeks   HISTORY OF PRESENT ILLNESS:  Brittney Burgess is a 60 y.o. 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  Screening mammogram in August which revealed a mass with calcifications, a second asymmetry and possible dilated ducts within the left breast.  Right breast was negative.  Diagnostic unilateral left mammogram and left breast ultrasound confirmed a 1.6 cm elongated mass containing pleomorphic calcifications at 7 o'clock, a 5 mm hypoechoic indeterminate mass containing 2 benign-appearing calcification at 2 o'clock, and several retroareolar ecstatic ducts containing internal debris or possible tissue.  Ultrasound guided biopsy in September of the mass at 5 o'clock revealed grade 3, invasive ductal carcinoma, grade 3.  Estrogen and progesterone receptors were negative with HER2 positive.  Ki67 was 50%. 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  Biopsy of the mass at 2 o'clock was consistent with fibrocystic changes, including sclerosing adenosis with calcifications.  No atypia, in situ or invasive malignancy. Biopsy at 11:30 o'clock revealed a fragmented intraductal papilloma and dilated duct.  No atypia, in situ or invasive malignancy identified.  She was treated with lumpectomy and sentinel lymph node biopsy in November.  Surgical  pathology from this procedure revealed a 1.2 cm, grade 3, invasive ductal carcinoma with high grade ductal carcinoma in situ.  One sentinel lymph node was negative for malignancy.  Margins were negative.  She also had placement of a port at that time.  She had a hysterectomy and bilateral salpingo oophorectomy in 2004 due to menorrhagia.  She is unsure if she was given hormone replacement.  She has baseline neuropathy of her feet secondary to diabetes.  She started adjuvant chemotherapy/HER2 targeted therapy with docetaxel/carboplatin/trastuzumab/pertuzumab (TCHP) in December. She was placed on gabapentin 300 mg 3 times daily in February for peripheral neuropathy of the bilateral feet and hands.  When she was seen prior to a 5th cycle of TCHP, she had worsening neuropathy with difficulty ambulating and was in a wheelchair. Due to this, we discontinued docetaxel.  She completed radiation therapy 50 Gy in late June 2022; only 1 treatment was missed.  INTERVAL HISTORY:  Brittney Burgess is here for follow up prior to her next maintenance HER2 targeted therapy.  She continues to report significant neuropathy of her feet and lower extremities despite gabapentin 300 mg BID.  She rates this as a 9/10, but it improves if she stays off her feet.  She uses a walker or cane to help with ambulation.  Otherwise, she is doing well.  Blood counts and chemistries are unremarkable except for a BUN of 20 and a creatinine of 1.2, previously 0.8.  I advised that she push fluids.  Her  appetite is good, and she has lost 2 pounds since her last visit.  She denies fever, chills or other signs of infection.  She denies nausea, vomiting, bowel issues, or abdominal pain.  She denies  sore throat, cough, dyspnea, or chest pain.  REVIEW OF SYSTEMS:  Review of Systems  Constitutional: Negative.  Negative for appetite change, chills, fatigue, fever and unexpected weight change.  HENT:  Negative.    Eyes: Negative.   Respiratory: Negative.   Negative for chest tightness, cough, hemoptysis, shortness of breath and wheezing.   Cardiovascular: Negative.  Negative for chest pain, leg swelling and palpitations.  Gastrointestinal: Negative.  Negative for abdominal distention, abdominal pain, blood in stool, constipation, diarrhea, nausea and vomiting.  Endocrine: Negative.   Genitourinary: Negative.  Negative for difficulty urinating, dysuria, frequency and hematuria.   Musculoskeletal: Negative.  Negative for arthralgias, back pain, flank pain, gait problem and myalgias.  Skin: Negative.   Neurological:  Positive for numbness (significant, of the feet and lower extremities). Negative for dizziness, extremity weakness, gait problem, headaches, light-headedness, seizures and speech difficulty.  Hematological: Negative.   Psychiatric/Behavioral: Negative.  Negative for depression and sleep disturbance. The patient is not nervous/anxious.    VITALS:  Blood pressure (!) 142/78, pulse 99, temperature 97.6 F (36.4 C), temperature source Oral, resp. rate 18, height _0  (1.753 m), weight 152 lb 1.6 oz (69 kg), SpO2 98 %.  Wt Readings from Last 3 Encounters:  08/27/20 152 lb 1.6 oz (69 kg)  08/12/20 151 lb 0.6 oz (68.5 kg)  08/06/20 154 lb 9.6 oz (70.1 kg)    Body mass index is 22.46 kg/m.  Performance status (ECOG): 1 - Symptomatic but completely ambulatory  PHYSICAL EXAM:  Physical Exam Constitutional:      General: She is not in acute distress.    Appearance: Normal appearance. She is normal weight.  HENT:     Head: Normocephalic and atraumatic.  Eyes:     General: No scleral icterus.    Extraocular Movements: Extraocular movements intact.     Conjunctiva/sclera: Conjunctivae normal.     Pupils: Pupils are equal, round, and reactive to light.  Cardiovascular:     Rate and Rhythm: Normal rate and regular rhythm.     Pulses: Normal pulses.     Heart sounds: Normal heart sounds. No murmur heard.   No friction rub. No gallop.   Pulmonary:     Effort: Pulmonary effort is normal. No respiratory distress.     Breath sounds: Normal breath sounds.  Chest:     Comments: Hyperpigmentation of the left breast and mild desquamation.  No tenderness. Abdominal:     General: Bowel sounds are normal. There is no distension.     Palpations: Abdomen is soft. There is no hepatomegaly, splenomegaly or mass.     Tenderness: There is no abdominal tenderness.  Musculoskeletal:        General: Normal range of motion.     Cervical back: Normal range of motion and neck supple.     Right lower leg: No edema.     Left lower leg: No edema.  Lymphadenopathy:     Cervical: No cervical adenopathy.  Skin:    General: Skin is warm and dry.  Neurological:     General: No focal deficit present.     Mental Status: She is alert and oriented to person, place, and time. Mental status is at baseline.  Psychiatric:        Mood and Affect: Mood normal.        Behavior: Behavior normal.        Thought Content: Thought content normal.        Judgment: Judgment normal.   LABS:  CBC Latest Ref Rng & Units 08/27/2020 08/06/2020 07/16/2020  WBC - 6.3 6.4 6.3  Hemoglobin 12.0 - 16.0 12.0 11.9(A) 12.4  Hematocrit 36 - 46 36 35(A) 37  Platelets 150 - 399 241 217 167   CMP Latest Ref Rng & Units 08/27/2020 08/06/2020 07/16/2020  BUN 4 - _0 -  Creatinine 0.5 - 1.1 1.2(A) 0.8 -  Sodium 137 - 147 143 142 -  Potassium 3.4 - 5.3 3.6 3.6 -  Chloride 99 - 108 107 104 -  CO2 13 - 22 25(A) 27(A) -  Calcium 8.7 - 10.7 9.9 10.0 9.8  Alkaline Phos 25 - 125 97 103 78  AST 13 - 35 _1 ALT 7 - 35 _2 STUDIES:  No results found.    HISTORY:   Allergies:  Allergies  Allergen Reactions   Metformin And Related Other (See Comments)    Twitching, nausea    Current Medications: Current Outpatient Medications  Medication Sig Dispense Refill   amLODipine (NORVASC) 5 MG tablet Take 1 tablet (5 mg total) by mouth daily. 30 tablet 0    COVID-19 mRNA vaccine, Moderna, 100 MCG/0.5ML injection USE AS DIRECTED .25 mL 0   dexamethasone (DECADRON) 4 MG tablet TAKE 2 TABLETS BY MOUTH TWICE A DAY THE DAY BEFORE CHEMO. AND FOR 2 DAYS AFTER 60 tablet 0   gabapentin (NEURONTIN) 300 MG capsule Take 1 capsule (300 mg total) by mouth 2 (two) times daily. (Patient taking differently: Take 300 mg by mouth 2 (two) times daily. 2 in the morning and  1 in the afternoon before bed) 60 capsule 0   hydrochlorothiazide (HYDRODIURIL) 25 MG tablet TAKE 1 TABLET (25 MG TOTAL) BY MOUTH DAILY. 30 tablet 0   insulin glargine (LANTUS) 100 UNIT/ML Solostar Pen Inject into the skin. 10 units in the morning and 40 units at bedtime     JANUVIA 100 MG tablet Take 100 mg by mouth daily.     KLOR-CON M20 20 MEQ tablet TAKE 1 TABLET BY MOUTH TWICE A DAY 60 tablet 2   loratadine (CLARITIN) 10 MG tablet Take 10 mg by mouth daily.     losartan (COZAAR) 50 MG tablet TAKE 1 TABLET BY MOUTH EVERY DAY 30 tablet 1   nicotine polacrilex (NICORETTE) 2 MG gum Chew 1 piece in mouth every two hours as directed every 1-2 hours when you have urge to smoke, max 24 pieces per day     omeprazole (PRILOSEC) 40 MG capsule Take 1 capsule (40 mg total) by mouth daily. 60 capsule 3   ondansetron (ZOFRAN) 4 MG tablet Take 1 tablet (4 mg total) by mouth every 4 (four) hours as needed for nausea. 90 tablet 3   oxyCODONE (OXY IR/ROXICODONE) 5 MG immediate release tablet Take 1-2 tablets (5-10 mg total) by mouth every 4 (four) hours as needed for severe pain. 120 tablet 0   prochlorperazine (COMPAZINE) 10 MG tablet Take 1 tablet (10 mg total) by mouth every 6 (six) hours as needed for nausea or vomiting. 90 tablet 3   No current facility-administered medications for this visit.     ASSESSMENT & PLAN:   Assessment:  1. 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 She completed chemotherapy at the end of April.  We will continue with  maintenance tastuzumab/pertuzumab every 3 weeks for a total of 1 year of HER2 targeted therapy.  She completed radiation therapy  in June.    2.  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 She has persistent numbness, but improvement in her weakness and occasional pain. Due to the numbness, she is at increased risk for falls. This has mildly improved with gabapentin 300 mg BID.  I will increase her dose to three times daily to try and improve her symptoms.  3. Hypokalemia, resolved.  I advised that she continue oral supplement 20 meq BID.   4. Weight loss. This has improved.  Plan:   She will proceed with maintenance trastuzumab/pertuzumab on August 3rd, and will continue every 3 weeks for a total of 1 year.  I will increase her gabapentin 300 mg to three times daily to try and improve her severe neuropathy. We did review the [facility] [place] [facility] Neuropathy Study today, and she is interested, so I will contact the clinical research nurse.  She will return to clinic in 3 weeks for repeat evaluation.  She verbalizes understanding of and agreement to the plans discussed today. She knows to call the office should any new questions or concerns arise.   I, Rita Ohara, am acting as scribe for Derwood Kaplan, MD  I have reviewed this report as typed by the medical scribe, and it is complete and accurate.

## 2020-08-25 ENCOUNTER — Other Ambulatory Visit: Payer: Self-pay | Admitting: Hematology and Oncology

## 2020-08-25 DIAGNOSIS — I10 Essential (primary) hypertension: Secondary | ICD-10-CM

## 2020-08-26 ENCOUNTER — Other Ambulatory Visit: Payer: Self-pay | Admitting: Oncology

## 2020-08-27 ENCOUNTER — Telehealth: Payer: Self-pay

## 2020-08-27 ENCOUNTER — Inpatient Hospital Stay (INDEPENDENT_AMBULATORY_CARE_PROVIDER_SITE_OTHER): Payer: PRIVATE HEALTH INSURANCE | Admitting: Oncology

## 2020-08-27 ENCOUNTER — Inpatient Hospital Stay: Payer: PRIVATE HEALTH INSURANCE

## 2020-08-27 ENCOUNTER — Encounter: Payer: Self-pay | Admitting: Oncology

## 2020-08-27 ENCOUNTER — Other Ambulatory Visit: Payer: Self-pay | Admitting: Oncology

## 2020-08-27 VITALS — BP 142/78 | HR 99 | Temp 97.6°F | Resp 18 | Ht 69.0 in | Wt 152.1 lb

## 2020-08-27 DIAGNOSIS — Z171 Estrogen receptor negative status [ER-]: Secondary | ICD-10-CM

## 2020-08-27 DIAGNOSIS — T451X5A Adverse effect of antineoplastic and immunosuppressive drugs, initial encounter: Secondary | ICD-10-CM

## 2020-08-27 DIAGNOSIS — G620 Drug-induced polyneuropathy: Secondary | ICD-10-CM

## 2020-08-27 DIAGNOSIS — C50512 Malignant neoplasm of lower-outer quadrant of left female breast: Secondary | ICD-10-CM

## 2020-08-27 LAB — CBC AND DIFFERENTIAL
HCT: 36 (ref 36–46)
Hemoglobin: 12 (ref 12.0–16.0)
Neutrophils Absolute: 4.91
Platelets: 241 (ref 150–399)
WBC: 6.3

## 2020-08-27 LAB — COMPREHENSIVE METABOLIC PANEL
Albumin: 4.4 (ref 3.5–5.0)
Calcium: 9.9 (ref 8.7–10.7)

## 2020-08-27 LAB — BASIC METABOLIC PANEL
BUN: 20 (ref 4–21)
CO2: 25 — AB (ref 13–22)
Chloride: 107 (ref 99–108)
Creatinine: 1.2 — AB (ref 0.5–1.1)
Glucose: 160
Potassium: 3.6 (ref 3.4–5.3)
Sodium: 143 (ref 137–147)

## 2020-08-27 LAB — CBC: RBC: 4.12 (ref 3.87–5.11)

## 2020-08-27 LAB — HEPATIC FUNCTION PANEL
ALT: 12 (ref 7–35)
AST: 20 (ref 13–35)
Alkaline Phosphatase: 97 (ref 25–125)
Bilirubin, Total: 0.3

## 2020-08-27 MED ORDER — GABAPENTIN 300 MG PO CAPS: 300.0000 mg | ORAL_CAPSULE | Freq: Three times a day (TID) | ORAL | 5 refills | Status: DC

## 2020-08-27 NOTE — Telephone Encounter (Signed)
[facility] [id number] Introduction:   Research nurse called patient this afternoon for referral from Dr. Hinton Rao for the [facility] protocol for chemotherapy induced peripheral neuropathy. The patient confirmed identity x 2 identifiers. The patient states that Dr. Hinton Rao had briefly reviewed the protocol with her in the clinic today and she is interested in participating in the study. Research nurse will mail a copy of the consent / Hipaa to the patient for her to read prior to her visit on August 18th with Dr. Hinton Rao, with a business card and clinical trial brochure. The patient was encouraged to please call the research department if she has any questions for concerns prior to her next appointment. She does rate her pain to be a 9 out of 10 on the average. She is currently taking gabapentin that is not effective and would like to try anything to help.   Jeral Fruit, RN 08/27/20 3:45 PM

## 2020-08-30 ENCOUNTER — Encounter: Payer: Self-pay | Admitting: Oncology

## 2020-08-30 ENCOUNTER — Encounter: Payer: Self-pay | Admitting: Hematology and Oncology

## 2020-08-31 ENCOUNTER — Encounter: Payer: Self-pay | Admitting: Oncology

## 2020-09-01 ENCOUNTER — Other Ambulatory Visit: Payer: Self-pay | Admitting: Hematology and Oncology

## 2020-09-01 DIAGNOSIS — R600 Localized edema: Secondary | ICD-10-CM

## 2020-09-02 ENCOUNTER — Encounter: Payer: Self-pay | Admitting: Oncology

## 2020-09-02 ENCOUNTER — Other Ambulatory Visit: Payer: Self-pay

## 2020-09-02 ENCOUNTER — Encounter: Payer: Self-pay | Admitting: Hematology and Oncology

## 2020-09-02 ENCOUNTER — Inpatient Hospital Stay: Payer: Medicaid Other | Attending: Hematology and Oncology

## 2020-09-02 VITALS — BP 143/80 | HR 93 | Temp 97.8°F | Resp 18 | Ht 69.0 in | Wt 154.5 lb

## 2020-09-02 DIAGNOSIS — Z79899 Other long term (current) drug therapy: Secondary | ICD-10-CM | POA: Diagnosis not present

## 2020-09-02 DIAGNOSIS — Z923 Personal history of irradiation: Secondary | ICD-10-CM | POA: Diagnosis not present

## 2020-09-02 DIAGNOSIS — C50512 Malignant neoplasm of lower-outer quadrant of left female breast: Secondary | ICD-10-CM | POA: Insufficient documentation

## 2020-09-02 DIAGNOSIS — Z9221 Personal history of antineoplastic chemotherapy: Secondary | ICD-10-CM | POA: Insufficient documentation

## 2020-09-02 DIAGNOSIS — Z171 Estrogen receptor negative status [ER-]: Secondary | ICD-10-CM

## 2020-09-02 DIAGNOSIS — Z5112 Encounter for antineoplastic immunotherapy: Secondary | ICD-10-CM | POA: Insufficient documentation

## 2020-09-02 MED ORDER — SODIUM CHLORIDE 0.9 % IV SOLN
420.0000 mg | Freq: Once | INTRAVENOUS | Status: AC
  Administered 2020-09-02: 420 mg via INTRAVENOUS
  Filled 2020-09-02: qty 14

## 2020-09-02 MED ORDER — DIPHENHYDRAMINE HCL 50 MG/ML IJ SOLN: 25.0000 mg | Freq: Once | INTRAMUSCULAR | Status: DC

## 2020-09-02 MED ORDER — SODIUM CHLORIDE 0.9 % IV SOLN
Freq: Once | INTRAVENOUS | Status: AC
  Filled 2020-09-02: qty 250

## 2020-09-02 MED ORDER — SODIUM CHLORIDE 0.9% FLUSH
10.0000 mL | INTRAVENOUS | Status: DC | PRN
  Administered 2020-09-02: 10 mL
  Filled 2020-09-02: qty 10

## 2020-09-02 MED ORDER — TRASTUZUMAB-ANNS CHEMO 150 MG IV SOLR
6.0000 mg/kg | Freq: Once | INTRAVENOUS | Status: AC
  Administered 2020-09-02: 420 mg via INTRAVENOUS
  Filled 2020-09-02: qty 20

## 2020-09-02 MED ORDER — ACETAMINOPHEN 325 MG PO TABS: 650.0000 mg | ORAL_TABLET | Freq: Once | ORAL | Status: DC

## 2020-09-02 MED ORDER — HEPARIN SOD (PORK) LOCK FLUSH 100 UNIT/ML IV SOLN
500.0000 [IU] | Freq: Once | INTRAVENOUS | Status: AC | PRN
  Administered 2020-09-02: 500 [IU]
  Filled 2020-09-02: qty 5

## 2020-09-02 NOTE — Patient Instructions (Signed)
Brittney Burgess  Discharge Instructions: Thank you for choosing [facility] to provide your oncology and hematology care.  If you have a lab appointment with the [facility], please go directly to the [facility] and check in at the registration area.   Wear comfortable clothing and clothing appropriate for easy access to any Portacath or PICC line.   We strive to give you quality time with your provider. You may need to reschedule your appointment if you arrive late (15 or more minutes).  Arriving late affects you and other patients whose appointments are after yours.  Also, if you miss three or more appointments without notifying the office, you may be dismissed from the clinic at the provider's discretion.      For prescription refill requests, have your pharmacy contact our office and allow 72 hours for refills to be completed.    Today you received the following chemotherapy and/or immunotherapy agents trastuzumab ,pertuzumab      To help prevent nausea and vomiting after your treatment, we encourage you to take your nausea medication as directed.  BELOW ARE SYMPTOMS THAT SHOULD BE REPORTED IMMEDIATELY: *FEVER GREATER THAN 100.4 F (38 C) OR HIGHER *CHILLS OR SWEATING *NAUSEA AND VOMITING THAT IS NOT CONTROLLED WITH YOUR NAUSEA MEDICATION *UNUSUAL SHORTNESS OF BREATH *UNUSUAL BRUISING OR BLEEDING *URINARY PROBLEMS (pain or burning when urinating, or frequent urination) *BOWEL PROBLEMS (unusual diarrhea, constipation, pain near the anus) TENDERNESS IN MOUTH AND THROAT WITH OR WITHOUT PRESENCE OF ULCERS (sore throat, sores in mouth, or a toothache) UNUSUAL RASH, SWELLING OR PAIN  UNUSUAL VAGINAL DISCHARGE OR ITCHING   Items with * indicate a potential emergency and should be followed up as soon as possible or go to the Emergency Department if any problems should occur.  Please show the CHEMOTHERAPY ALERT CARD or IMMUNOTHERAPY ALERT CARD at  check-in to the Emergency Department and triage nurse.  Should you have questions after your visit or need to cancel or reschedule your appointment, please contact [facility]  Dept: [phone]  and follow the prompts.  Office hours are 8:00 a.m. to 4:30 p.m. Monday - Friday. Please note that voicemails left after 4:00 p.m. may not be returned until the following business day.  We are closed weekends and major holidays. You have access to a nurse at all times for urgent questions. Please call the main number to the clinic Dept: [phone] and follow the prompts.  For any non-urgent questions, you may also contact your provider using MyChart. We now offer e-Visits for anyone 75 and older to request care online for non-urgent symptoms. For details visit [email or web address].   Also download the MyChart app! Go to the app store, search "MyChart", open the app, select [facility], and log in with your MyChart username and password.  Due to Covid, a mask is required upon entering the hospital/clinic. If you do not have a mask, one will be given to you upon arrival. For doctor visits, patients may have 1 support person aged 59 or older with them. For treatment visits, patients cannot have anyone with them due to current Covid guidelines and our immunocompromised population.

## 2020-09-08 ENCOUNTER — Encounter: Payer: Self-pay | Admitting: Oncology

## 2020-09-08 ENCOUNTER — Encounter: Payer: Self-pay | Admitting: Hematology and Oncology

## 2020-09-17 ENCOUNTER — Encounter: Payer: Self-pay | Admitting: Hematology and Oncology

## 2020-09-17 ENCOUNTER — Inpatient Hospital Stay (INDEPENDENT_AMBULATORY_CARE_PROVIDER_SITE_OTHER): Payer: Medicaid Other | Admitting: Hematology and Oncology

## 2020-09-17 ENCOUNTER — Other Ambulatory Visit: Payer: Self-pay | Admitting: Hematology and Oncology

## 2020-09-17 ENCOUNTER — Telehealth: Payer: Self-pay | Admitting: Hematology and Oncology

## 2020-09-17 ENCOUNTER — Inpatient Hospital Stay: Payer: Medicaid Other

## 2020-09-17 VITALS — BP 191/84 | HR 99 | Temp 97.6°F | Resp 18 | Ht 69.0 in | Wt 163.8 lb

## 2020-09-17 DIAGNOSIS — C50512 Malignant neoplasm of lower-outer quadrant of left female breast: Secondary | ICD-10-CM

## 2020-09-17 DIAGNOSIS — Z171 Estrogen receptor negative status [ER-]: Secondary | ICD-10-CM

## 2020-09-17 LAB — BASIC METABOLIC PANEL
BUN: 18 (ref 4–21)
CO2: 24 — AB (ref 13–22)
Chloride: 109 — AB (ref 99–108)
Creatinine: 0.9 (ref 0.5–1.1)
Glucose: 122
Potassium: 3.9 (ref 3.4–5.3)
Sodium: 143 (ref 137–147)

## 2020-09-17 LAB — HEPATIC FUNCTION PANEL
ALT: 15 (ref 7–35)
AST: 26 (ref 13–35)
Alkaline Phosphatase: 93 (ref 25–125)
Bilirubin, Total: 0.4

## 2020-09-17 LAB — COMPREHENSIVE METABOLIC PANEL
Albumin: 4.3 (ref 3.5–5.0)
Calcium: 9.6 (ref 8.7–10.7)

## 2020-09-17 LAB — CBC AND DIFFERENTIAL
HCT: 35 — AB (ref 36–46)
Hemoglobin: 11.9 — AB (ref 12.0–16.0)
Neutrophils Absolute: 4.8
Platelets: 208 (ref 150–399)
WBC: 6.4

## 2020-09-17 LAB — CBC: RBC: 3.94 (ref 3.87–5.11)

## 2020-09-17 MED ORDER — OXYCODONE HCL 10 MG PO TABS: 10.0000 mg | ORAL_TABLET | ORAL | 0 refills | Status: DC | PRN

## 2020-09-17 NOTE — Progress Notes (Signed)
[facility]  [address] [place],  [place]  [address] [phone]  Clinic Day:  09/17/2020  Referring physician: Carolynne Edouard, MD   CHIEF COMPLAINT:  CC: A 60 year old female with history of Stage IB HER2 receptor positive breast cancer here for 3 week follow up prior to cycle 12 trastuzumab/ pertuzumab  Current Treatment:  Maintenance trastuzumab/pertuzumab every 3 weeks   HISTORY OF PRESENT ILLNESS:  Brittney Burgess is a 60 y.o. 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  Screening mammogram in August which revealed a mass with calcifications, a second asymmetry and possible dilated ducts within the left breast.  Right breast was negative.  Diagnostic unilateral left mammogram and left breast ultrasound confirmed a 1.6 cm elongated mass containing pleomorphic calcifications at 7 o'clock, a 5 mm hypoechoic indeterminate mass containing 2 benign-appearing calcification at 2 o'clock, and several retroareolar ecstatic ducts containing internal debris or possible tissue.  Ultrasound guided biopsy in September of the mass at 5 o'clock revealed grade 3, invasive ductal carcinoma, grade 3.  Estrogen and progesterone receptors were negative with HER2 positive.  Ki67 was 50%. 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  Biopsy of the mass at 2 o'clock was consistent with fibrocystic changes, including sclerosing adenosis with calcifications.  No atypia, in situ or invasive malignancy. Biopsy at 11:30 o'clock revealed a fragmented intraductal papilloma and dilated duct.  No atypia, in situ or invasive malignancy identified.  She was treated with lumpectomy and sentinel lymph node biopsy in November.  Surgical pathology from this procedure revealed a 1.2 cm, grade 3, invasive ductal carcinoma with high grade ductal carcinoma in situ.  One sentinel lymph node was negative for malignancy.   Margins were negative.  She also had placement of a port at that time.  She had a hysterectomy and bilateral salpingo oophorectomy in 2004 due to menorrhagia.  She is unsure if she was given hormone replacement.  She has baseline neuropathy of her feet secondary to diabetes.  She started adjuvant chemotherapy/HER2 targeted therapy with docetaxel/carboplatin/trastuzumab/pertuzumab (TCHP) in December. She was placed on gabapentin 300 mg 3 times daily in February for peripheral neuropathy of the bilateral feet and hands.  When she was seen prior to a 5th cycle of TCHP, she had worsening neuropathy with difficulty ambulating and was in a wheelchair. Due to this, we discontinued docetaxel.  She completed radiation therapy 50 Gy in late June 2022; only 1 treatment was missed.  INTERVAL HISTORY:  Brittney Burgess is here for follow up prior to her next maintenance HER2 targeted therapy. She has been well since last visit. She continues to have neuropathy, but feels it is somewhat improved with gabapentin 300 three times a day. She states her oxycodone does relieve the pain when she takes 10 mg. She denies fever, chills, nausea or vomiting. She denies shortness of breath, chest pain or cough. She denies issue with bowel or bladder. CBC and CMP are unremarkable today. Her appetite and energy are good and she has gained weight since her last visit.   REVIEW OF SYSTEMS:  Review of Systems  Constitutional: Negative.  Negative for appetite change, chills, fatigue, fever and unexpected weight change.  HENT:  Negative.    Eyes: Negative.   Respiratory: Negative.  Negative for chest tightness, cough, hemoptysis, shortness of breath and wheezing.   Cardiovascular: Negative.  Negative for chest pain, leg swelling and palpitations.  Gastrointestinal: Negative.  Negative for abdominal distention, abdominal pain, blood in stool, constipation, diarrhea, nausea and vomiting.  Endocrine: Negative.   Genitourinary: Negative.  Negative  for difficulty urinating, dysuria, frequency and hematuria.   Musculoskeletal: Negative.  Negative for arthralgias, back pain, flank pain, gait problem and myalgias.  Skin: Negative.   Neurological:  Positive for numbness (significant, of the feet and lower extremities). Negative for dizziness, extremity weakness, gait problem, headaches, light-headedness, seizures and speech difficulty.  Hematological: Negative.   Psychiatric/Behavioral: Negative.  Negative for depression and sleep disturbance. The patient is not nervous/anxious.    VITALS:  Blood pressure (!) 191/84, pulse 99, temperature 97.6 F (36.4 C), temperature source Oral, resp. rate 18, height '5\' 9"'  (1.753 m), weight 163 lb 12.8 oz (74.3 kg), SpO2 97 %.  Wt Readings from Last 3 Encounters:  09/17/20 163 lb 12.8 oz (74.3 kg)  09/02/20 154 lb 8 oz (70.1 kg)  08/27/20 152 lb 1.6 oz (69 kg)    Body mass index is 24.19 kg/m.  Performance status (ECOG): 1 - Symptomatic but completely ambulatory  PHYSICAL EXAM:  Physical Exam Constitutional:      General: She is not in acute distress.    Appearance: Normal appearance. She is normal weight.  HENT:     Head: Normocephalic and atraumatic.  Eyes:     General: No scleral icterus.    Extraocular Movements: Extraocular movements intact.     Conjunctiva/sclera: Conjunctivae normal.     Pupils: Pupils are equal, round, and reactive to light.  Cardiovascular:     Rate and Rhythm: Normal rate and regular rhythm.     Pulses: Normal pulses.     Heart sounds: Normal heart sounds. No murmur heard.   No friction rub. No gallop.  Pulmonary:     Effort: Pulmonary effort is normal. No respiratory distress.     Breath sounds: Normal breath sounds.  Chest:     Comments: Hyperpigmentation of the left breast and mild desquamation.  No tenderness.  No masses in either breast.  Port site is stable. Abdominal:     General: Bowel sounds are normal. There is no distension.     Palpations: Abdomen  is soft. There is no hepatomegaly, splenomegaly or mass.     Tenderness: There is no abdominal tenderness.  Musculoskeletal:        General: Normal range of motion.     Cervical back: Normal range of motion and neck supple.     Right lower leg: No edema.     Left lower leg: No edema.  Lymphadenopathy:     Cervical: No cervical adenopathy.  Skin:    General: Skin is warm and dry.  Neurological:     General: No focal deficit present.     Mental Status: She is alert and oriented to person, place, and time. Mental status is at baseline.  Psychiatric:        Mood and Affect: Mood normal.        Behavior: Behavior normal.        Thought Content: Thought content normal.        Judgment: Judgment normal.   LABS:   CBC Latest Ref Rng & Units 09/17/2020 08/27/2020 08/06/2020  WBC - 6.4 6.3 6.4  Hemoglobin 12.0 - 16.0 11.9(A) 12.0 11.9(A)  Hematocrit 36 - 46 35(A) 36 35(A)  Platelets 150 - 399 208 241 217   CMP Latest Ref Rng & Units 09/17/2020 08/27/2020 08/06/2020  BUN 4 - 21 18  20 17  Creatinine 0.5 - 1.1 0.9 1.2(A) 0.8  Sodium 137 - 147 143 143 142  Potassium 3.4 - 5.3 3.9 3.6 3.6  Chloride 99 - 108 109(A) 107 104  CO2 13 - 22 24(A) 25(A) 27(A)  Calcium 8.7 - 10.7 9.6 9.9 10.0  Alkaline Phos 25 - 125 93 97 103  AST 13 - 35 '26 20 18  ' ALT 7 - 35 '15 12 10    ' STUDIES:  No results found.    HISTORY:   Allergies:  Allergies  Allergen Reactions   Metformin And Related Other (See Comments)    Twitching, nausea    Current Medications: Current Outpatient Medications  Medication Sig Dispense Refill   amLODipine (NORVASC) 5 MG tablet Take 1 tablet (5 mg total) by mouth daily. 30 tablet 0   COVID-19 mRNA vaccine, Moderna, 100 MCG/0.5ML injection USE AS DIRECTED .25 mL 0   gabapentin (NEURONTIN) 300 MG capsule Take 1 capsule (300 mg total) by mouth 3 (three) times daily. 90 capsule 5   hydrochlorothiazide (HYDRODIURIL) 25 MG tablet TAKE 1 TABLET (25 MG TOTAL) BY MOUTH DAILY. 30 tablet  2   insulin glargine (LANTUS) 100 UNIT/ML Solostar Pen Inject into the skin. 10 units in the morning and 40 units at bedtime     JANUVIA 100 MG tablet Take 100 mg by mouth daily.     KLOR-CON M20 20 MEQ tablet TAKE 1 TABLET BY MOUTH TWICE A DAY 60 tablet 2   losartan (COZAAR) 50 MG tablet TAKE 1 TABLET BY MOUTH EVERY DAY 30 tablet 2   ondansetron (ZOFRAN) 4 MG tablet Take 1 tablet (4 mg total) by mouth every 4 (four) hours as needed for nausea. 90 tablet 3   oxyCODONE 10 MG TABS Take 1-1.5 tablets (10-15 mg total) by mouth every 4 (four) hours as needed for severe pain. 120 tablet 0   prochlorperazine (COMPAZINE) 10 MG tablet Take 1 tablet (10 mg total) by mouth every 6 (six) hours as needed for nausea or vomiting. 90 tablet 3   No current facility-administered medications for this visit.     ASSESSMENT & PLAN:   Assessment:  1. 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 She completed chemotherapy at the end of April.  We will continue with maintenance tastuzumab/pertuzumab every 3 weeks for a total of 1 year of HER2 targeted therapy.  She completed radiation therapy in June. She is due her next cycle next week.    2.  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 She has persistent numbness, but improvement in her weakness and occasional pain. Due to the numbness, she is at increased risk for falls. This has mildly improved with gabapentin 300 mg BID.  I will increase her dose to three times daily to try and improve her symptoms. She has had   3. Hypokalemia, resolved.  I advised that she continue oral supplement 20 meq BID.   4. Weight loss. This has improved.  Plan:   She will proceed with maintenance trastuzumab/pertuzumab on August 24. I will refill her oxycodone today. She will continue with daily potassium. We will see her back in clinic in 3 weeks for repeat evaluation prior to next cycle HER2  treatment.   Dayton Scrape, FNP- [facility]

## 2020-09-17 NOTE — Telephone Encounter (Signed)
Per 8/18 LOS next appt scheduled and confirmed with patient

## 2020-09-22 ENCOUNTER — Encounter: Payer: Self-pay | Admitting: Oncology

## 2020-09-23 ENCOUNTER — Ambulatory Visit: Payer: PRIVATE HEALTH INSURANCE

## 2020-09-24 ENCOUNTER — Other Ambulatory Visit: Payer: Self-pay

## 2020-09-24 ENCOUNTER — Encounter: Payer: Self-pay | Admitting: Hematology and Oncology

## 2020-09-24 ENCOUNTER — Inpatient Hospital Stay: Payer: Medicaid Other

## 2020-09-24 ENCOUNTER — Encounter: Payer: Self-pay | Admitting: Oncology

## 2020-09-24 VITALS — BP 136/70 | HR 91 | Temp 97.6°F | Resp 18 | Ht 69.0 in | Wt 169.2 lb

## 2020-09-24 DIAGNOSIS — Z5112 Encounter for antineoplastic immunotherapy: Secondary | ICD-10-CM | POA: Diagnosis not present

## 2020-09-24 DIAGNOSIS — C50512 Malignant neoplasm of lower-outer quadrant of left female breast: Secondary | ICD-10-CM

## 2020-09-24 MED ORDER — ACETAMINOPHEN 325 MG PO TABS: 650.0000 mg | ORAL_TABLET | Freq: Once | ORAL | Status: DC

## 2020-09-24 MED ORDER — SODIUM CHLORIDE 0.9 % IV SOLN: Freq: Once | INTRAVENOUS | Status: AC

## 2020-09-24 MED ORDER — SODIUM CHLORIDE 0.9% FLUSH
10.0000 mL | INTRAVENOUS | Status: DC | PRN
  Administered 2020-09-24: 10 mL

## 2020-09-24 MED ORDER — DIPHENHYDRAMINE HCL 50 MG/ML IJ SOLN: 25.0000 mg | Freq: Once | INTRAMUSCULAR | Status: DC

## 2020-09-24 MED ORDER — SODIUM CHLORIDE 0.9 % IV SOLN
6.0000 mg/kg | Freq: Once | INTRAVENOUS | Status: AC
  Administered 2020-09-24: 420 mg via INTRAVENOUS
  Filled 2020-09-24: qty 20

## 2020-09-24 MED ORDER — HEPARIN SOD (PORK) LOCK FLUSH 100 UNIT/ML IV SOLN
500.0000 [IU] | Freq: Once | INTRAVENOUS | Status: AC | PRN
Start: 2020-09-24 — End: 2020-09-24
  Administered 2020-09-24: 500 [IU]

## 2020-09-24 MED ORDER — SODIUM CHLORIDE 0.9 % IV SOLN
420.0000 mg | Freq: Once | INTRAVENOUS | Status: AC
  Administered 2020-09-24: 420 mg via INTRAVENOUS
  Filled 2020-09-24: qty 14

## 2020-09-24 NOTE — Patient Instructions (Signed)
[facility]  Discharge Instructions: Thank you for choosing [facility] to provide your oncology and hematology care.  If you have a lab appointment with the [facility], please go directly to the [facility] and check in at the registration area.   Wear comfortable clothing and clothing appropriate for easy access to any Portacath or PICC line.   We strive to give you quality time with your provider. You may need to reschedule your appointment if you arrive late (15 or more minutes).  Arriving late affects you and other patients whose appointments are after yours.  Also, if you miss three or more appointments without notifying the office, you may be dismissed from the clinic at the provider's discretion.      For prescription refill requests, have your pharmacy contact our office and allow 72 hours for refills to be completed.    Today you received the following chemotherapy and/or immunotherapy agents Pertuzumab,Trastuzumab    To help prevent nausea and vomiting after your treatment, we encourage you to take your nausea medication as directed.  BELOW ARE SYMPTOMS THAT SHOULD BE REPORTED IMMEDIATELY: *FEVER GREATER THAN 100.4 F (38 C) OR HIGHER *CHILLS OR SWEATING *NAUSEA AND VOMITING THAT IS NOT CONTROLLED WITH YOUR NAUSEA MEDICATION *UNUSUAL SHORTNESS OF BREATH *UNUSUAL BRUISING OR BLEEDING *URINARY PROBLEMS (pain or burning when urinating, or frequent urination) *BOWEL PROBLEMS (unusual diarrhea, constipation, pain near the anus) TENDERNESS IN MOUTH AND THROAT WITH OR WITHOUT PRESENCE OF ULCERS (sore throat, sores in mouth, or a toothache) UNUSUAL RASH, SWELLING OR PAIN  UNUSUAL VAGINAL DISCHARGE OR ITCHING   Items with * indicate a potential emergency and should be followed up as soon as possible or go to the Emergency Department if any problems should occur.  Please show the CHEMOTHERAPY ALERT CARD or IMMUNOTHERAPY ALERT CARD at check-in  to the Emergency Department and triage nurse.  Should you have questions after your visit or need to cancel or reschedule your appointment, please contact [facility]  Dept: [phone]  and follow the prompts.  Office hours are 8:00 a.m. to 4:30 p.m. Monday - Friday. Please note that voicemails left after 4:00 p.m. may not be returned until the following business day.  We are closed weekends and major holidays. You have access to a nurse at all times for urgent questions. Please call the main number to the clinic Dept: [phone] and follow the prompts.  For any non-urgent questions, you may also contact your provider using MyChart. We now offer e-Visits for anyone 104 and older to request care online for non-urgent symptoms. For details visit [email or web address].   Also download the MyChart app! Go to the app store, search "MyChart", open the app, select [facility], and log in with your MyChart username and password.  Due to Covid, a mask is required upon entering the hospital/clinic. If you do not have a mask, one will be given to you upon arrival. For doctor visits, patients may have 1 support person aged 50 or older with them. For treatment visits, patients cannot have anyone with them due to current Covid guidelines and our immunocompromised population.

## 2020-09-24 NOTE — Progress Notes (Signed)
1139: PT STABLE AT TIME OF DISCHARGE

## 2020-10-06 ENCOUNTER — Encounter: Payer: Self-pay | Admitting: Hematology and Oncology

## 2020-10-06 ENCOUNTER — Encounter: Payer: Self-pay | Admitting: Oncology

## 2020-10-07 ENCOUNTER — Encounter: Payer: Self-pay | Admitting: Oncology

## 2020-10-07 ENCOUNTER — Encounter: Payer: Self-pay | Admitting: Hematology and Oncology

## 2020-10-08 ENCOUNTER — Inpatient Hospital Stay: Payer: Medicaid Other | Attending: Hematology and Oncology | Admitting: Hematology and Oncology

## 2020-10-08 ENCOUNTER — Inpatient Hospital Stay: Payer: Medicaid Other

## 2020-10-08 ENCOUNTER — Encounter: Payer: Self-pay | Admitting: Hematology and Oncology

## 2020-10-08 ENCOUNTER — Other Ambulatory Visit: Payer: Self-pay

## 2020-10-08 ENCOUNTER — Other Ambulatory Visit: Payer: Self-pay | Admitting: Hematology and Oncology

## 2020-10-08 DIAGNOSIS — Z171 Estrogen receptor negative status [ER-]: Secondary | ICD-10-CM | POA: Diagnosis not present

## 2020-10-08 DIAGNOSIS — C50512 Malignant neoplasm of lower-outer quadrant of left female breast: Secondary | ICD-10-CM | POA: Diagnosis not present

## 2020-10-08 DIAGNOSIS — Z5112 Encounter for antineoplastic immunotherapy: Secondary | ICD-10-CM | POA: Insufficient documentation

## 2020-10-08 DIAGNOSIS — Z9221 Personal history of antineoplastic chemotherapy: Secondary | ICD-10-CM | POA: Insufficient documentation

## 2020-10-08 DIAGNOSIS — Z79899 Other long term (current) drug therapy: Secondary | ICD-10-CM | POA: Insufficient documentation

## 2020-10-08 LAB — CBC
MCV: 87 (ref 81–99)
RBC: 4.15 (ref 3.87–5.11)

## 2020-10-08 LAB — BASIC METABOLIC PANEL
BUN: 20 (ref 4–21)
CO2: 23 — AB (ref 13–22)
Chloride: 108 (ref 99–108)
Creatinine: 1 (ref 0.5–1.1)
Glucose: 143
Potassium: 3.6 (ref 3.4–5.3)
Sodium: 140 (ref 137–147)

## 2020-10-08 LAB — CBC AND DIFFERENTIAL
HCT: 36 (ref 36–46)
Hemoglobin: 12.1 (ref 12.0–16.0)
Neutrophils Absolute: 5.03
Platelets: 204 (ref 150–399)
WBC: 6.7

## 2020-10-08 LAB — COMPREHENSIVE METABOLIC PANEL
Albumin: 4.1 (ref 3.5–5.0)
Calcium: 9.5 (ref 8.7–10.7)

## 2020-10-08 LAB — HEPATIC FUNCTION PANEL
ALT: 13 (ref 7–35)
AST: 21 (ref 13–35)
Alkaline Phosphatase: 100 (ref 25–125)
Bilirubin, Total: 0.3

## 2020-10-08 NOTE — Progress Notes (Addendum)
[facility]  [address] [place],  [place]  [address] [phone]  Clinic Day:  10/08/2020  Referring physician: Carolynne Edouard, MD  ASSESSMENT & PLAN:   Assessment & Plan: Breast cancer [facility]) She presents today for evaluation prior to cycle 13 maintenance herceptin/ perjeta. She is tolerating well with some side effects, but states these are manageable. She will proceed with treatment as scheduled tomorrow. She will return to clinic in 3 weeks for repeat evaluation prior to cycle 14 HER2 therapy.   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 She continues on gabapentin three times a day and feels this is tolerable. She will let us know if we need to increase dosing. She also uses oxycodone 15 mg as needed every 4 hours for pain.   The patient understands the plans discussed today and is in agreement with them.  She knows to contact our office if she develops concerns prior to her next appointment.   [name], NP  [facility] [address] [place] [address] Dept: [phone] Dept Fax: [phone]   No orders of the defined types were placed in this encounter.     CHIEF COMPLAINT:  CC: A 60 year old female with history of breast cancer here for 3 week evaluation prior to cycle 13 maintenance herceptin/ perjeta  Current Treatment:  Herceptin/ Perjeta every 3 weeks   HISTORY OF PRESENT ILLNESS:   Oncology History  Breast cancer ([place])  12/08/2019 Initial Diagnosis   Breast cancer ([place])   01/01/2020 Cancer Staging   Staging form: Breast, AJCC 8th Edition - Clinical stage from 01/01/2020: Stage IA (cT1c, cN0(sn), cM0, G3, ER-, PR-, HER2+) - Signed by Derwood Kaplan, MD on 01/09/2020   01/29/2020 -  Chemotherapy    Patient is on Treatment Plan: BREAST  DOCETAXEL +  CARBOPLATIN + TRASTUZUMAB + PERTUZUMAB  (TCHP) Q21D / TRASTUZUMAB + PERTUZUMAB Q21D          INTERVAL HISTORY:  Brittney Burgess is here today for repeat clinical assessment prior to cycle 13 treatment. She has been well since last visit. She continues with gabapentin three times a day for neuropathy with prn oxycodone. She denies fever, chills, nausea or vomiting. She denies shortness of breath, chest pain or cough. She denies issue with bowel or bladder. CBC and CMP are unremarkable today.   REVIEW OF SYSTEMS:  Review of Systems  Constitutional:  Negative for appetite change, chills, diaphoresis, fatigue, fever and unexpected weight change.  HENT:   Negative for hearing loss, lump/mass, mouth sores, nosebleeds, sore throat, tinnitus, trouble swallowing and voice change.   Eyes:  Negative for eye problems and icterus.  Respiratory:  Negative for chest tightness, cough, hemoptysis, shortness of breath and wheezing.   Cardiovascular:  Negative for chest pain, leg swelling and palpitations.  Gastrointestinal:  Negative for abdominal distention, abdominal pain, blood in stool, constipation, diarrhea, nausea, rectal pain and vomiting.  Endocrine: Negative for hot flashes.  Genitourinary:  Negative for bladder incontinence, difficulty urinating, dyspareunia, dysuria, frequency, hematuria and nocturia.   Musculoskeletal:  Negative for arthralgias, back pain, flank pain, gait problem, myalgias, neck pain and neck stiffness.  Skin:  Negative for itching, rash and wound.  Neurological:  Positive for numbness. Negative for dizziness, extremity weakness, gait problem, headaches, light-headedness, seizures and speech difficulty.  Hematological:  Negative for adenopathy.  Does not bruise/bleed easily.  Psychiatric/Behavioral:  Negative for confusion, decreased concentration, depression, sleep disturbance and suicidal ideas. The patient is not nervous/anxious.     VITALS:  Blood pressure 131/63, pulse (!) 18,  temperature 98.2 F (36.8 C), temperature source Oral, resp. rate 18, height 5' 9" (1.753 m), weight 166 lb 11.2 oz (75.6 kg), SpO2 99 %.  Wt Readings from Last 3 Encounters:  10/08/20 166 lb 11.2 oz (75.6 kg)  09/24/20 169 lb 4 oz (76.8 kg)  09/17/20 163 lb 12.8 oz (74.3 kg)    Body mass index is 24.62 kg/m.  Performance status (ECOG): 1 - Symptomatic but completely ambulatory  PHYSICAL EXAM:  Physical Exam Constitutional:      General: She is not in acute distress.    Appearance: Normal appearance. She is normal weight. She is not ill-appearing, toxic-appearing or diaphoretic.  HENT:     Head: Normocephalic and atraumatic.     Nose: Nose normal. No congestion or rhinorrhea.     Mouth/Throat:     Mouth: Mucous membranes are moist.     Pharynx: Oropharynx is clear. No oropharyngeal exudate or posterior oropharyngeal erythema.  Eyes:     General: No scleral icterus.       Right eye: No discharge.        Left eye: No discharge.     Extraocular Movements: Extraocular movements intact.     Conjunctiva/sclera: Conjunctivae normal.     Pupils: Pupils are equal, round, and reactive to light.  Neck:     Vascular: No carotid bruit.  Cardiovascular:     Rate and Rhythm: Normal rate and regular rhythm.     Heart sounds: No murmur heard.   No friction rub. No gallop.  Pulmonary:     Effort: Pulmonary effort is normal. No respiratory distress.     Breath sounds: Normal breath sounds. No stridor. No wheezing, rhonchi or rales.  Chest:     Chest wall: No tenderness.  Abdominal:     General: Abdomen is flat. Bowel sounds are normal. There is no distension.     Palpations: There is no mass.     Tenderness: There is no abdominal tenderness. There is no right CVA tenderness, left CVA tenderness, guarding or rebound.     Hernia: No hernia is present.  Musculoskeletal:        General: No swelling, tenderness, deformity or signs of injury. Normal range of motion.     Cervical back: Normal  range of motion and neck supple. No rigidity or tenderness.     Right lower leg: No edema.     Left lower leg: No edema.  Lymphadenopathy:     Cervical: No cervical adenopathy.  Skin:    General: Skin is warm and dry.     Capillary Refill: Capillary refill takes less than 2 seconds.     Coloration: Skin is not jaundiced or pale.     Findings: No bruising, erythema, lesion or rash.  Neurological:     General: No focal deficit present.     Mental Status: She is alert and oriented to person, place, and time. Mental status is at baseline.     Cranial Nerves: No cranial nerve deficit.     Sensory: No sensory deficit.     Motor: No weakness.     Coordination: Coordination normal.     Gait: Gait normal.     Deep Tendon Reflexes: Reflexes normal.  Psychiatric:        Mood  and Affect: Mood normal.        Behavior: Behavior normal.        Thought Content: Thought content normal.        Judgment: Judgment normal.    LABS:   CBC Latest Ref Rng & Units 10/08/2020 09/17/2020 08/27/2020  WBC - 6.7 6.4 6.3  Hemoglobin 12.0 - 16.0 12.1 11.9(A) 12.0  Hematocrit 36 - 46 36 35(A) 36  Platelets 150 - 399 204 208 241   CMP Latest Ref Rng & Units 10/08/2020 09/17/2020 08/27/2020  BUN 4 - _0 Creatinine 0.5 - 1.1 1.0 0.9 1.2(A)  Sodium 137 - 147 140 143 143  Potassium 3.4 - 5.3 3.6 3.9 3.6  Chloride 99 - 108 108 109(A) 107  CO2 13 - 22 23(A) 24(A) 25(A)  Calcium 8.7 - 10.7 9.5 9.6 9.9  Alkaline Phos 25 - 125 100 93 97  AST 13 - 35 _1 ALT 7 - 35 _2 No results found for: CEA1 / No results found for: CEA1 No results found for: PSA1 No results found for: LTJ030 No results found for: SPQ330  No results found for: TOTALPROTELP, ALBUMINELP, A1GS, A2GS, BETS, BETA2SER, GAMS, MSPIKE, SPEI No results found for: TIBC, FERRITIN, IRONPCTSAT No results found for: LDH  STUDIES:  No results found.    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 She has a 32.00 pack-year smoking history. She has never used smokeless tobacco. She reports that she does not drink alcohol and does not use drugs.The patient is alone  today.  Allergies:  Allergies  Allergen Reactions   Metformin And Related Other (See Comments)    Twitching, nausea    Current Medications: Current Outpatient Medications  Medication Sig Dispense Refill   amLODipine (NORVASC) 5 MG tablet Take 1 tablet (5 mg total) by mouth daily. 30 tablet 0   COVID-19 mRNA vaccine, Moderna, 100 MCG/0.5ML injection USE AS DIRECTED .25 mL 0   gabapentin (NEURONTIN) 300 MG capsule Take 1 capsule (300 mg total) by mouth 3 (three) times daily. 90 capsule 5   hydrochlorothiazide (HYDRODIURIL) 25 MG tablet TAKE 1 TABLET (25 MG TOTAL) BY MOUTH DAILY. 30 tablet 2   insulin glargine (LANTUS) 100 UNIT/ML Solostar Pen Inject into the skin. 10 units in the morning and 40 units at bedtime     JANUVIA 100 MG tablet Take 100 mg by mouth daily.     KLOR-CON M20 20 MEQ tablet TAKE 1 TABLET BY MOUTH TWICE A DAY 60 tablet 2   losartan (COZAAR) 50 MG tablet TAKE 1 TABLET BY MOUTH EVERY DAY 30 tablet 2   ondansetron (ZOFRAN) 4 MG tablet Take 1 tablet (4 mg total) by mouth every 4 (four) hours  as needed for nausea. 90 tablet 3   oxyCODONE 10 MG TABS Take 1-1.5 tablets (10-15 mg total) by mouth every 4 (four) hours as needed for severe pain. 120 tablet 0    prochlorperazine (COMPAZINE) 10 MG tablet Take 1 tablet (10 mg total) by mouth every 6 (six) hours as needed for nausea or vomiting. 90 tablet 3   No current facility-administered medications for this visit.

## 2020-10-08 NOTE — Assessment & Plan Note (Signed)
She continues to have neuropathy with pain greater in her left leg more than her right. She continues on gabapentin three times a day and feels this is tolerable. She will let us know if we need to increase dosing. She also uses oxycodone 15 mg as needed every 4 hours for pain.

## 2020-10-08 NOTE — Assessment & Plan Note (Addendum)
She presents today for evaluation prior to cycle 13 maintenance herceptin/ perjeta. She is tolerating well with some side effects, but states these are manageable. She will proceed with treatment as scheduled tomorrow. She will return to clinic in 3 weeks for repeat evaluation prior to cycle 14 HER2 therapy.

## 2020-10-14 ENCOUNTER — Encounter: Payer: Self-pay | Admitting: Oncology

## 2020-10-14 MED FILL — Pertuzumab Soln for IV Infusion 420 MG/14ML (30 MG/ML): INTRAVENOUS | Qty: 14 | Status: AC

## 2020-10-14 MED FILL — Trastuzumab-anns For IV Soln 150 MG: INTRAVENOUS | Qty: 20 | Status: AC

## 2020-10-15 ENCOUNTER — Other Ambulatory Visit: Payer: Self-pay

## 2020-10-15 ENCOUNTER — Inpatient Hospital Stay: Payer: Medicaid Other

## 2020-10-15 VITALS — BP 147/76 | HR 99 | Temp 98.1°F | Resp 18 | Ht 69.0 in | Wt 167.0 lb

## 2020-10-15 DIAGNOSIS — C50512 Malignant neoplasm of lower-outer quadrant of left female breast: Secondary | ICD-10-CM | POA: Diagnosis present

## 2020-10-15 DIAGNOSIS — Z171 Estrogen receptor negative status [ER-]: Secondary | ICD-10-CM | POA: Diagnosis not present

## 2020-10-15 DIAGNOSIS — Z9221 Personal history of antineoplastic chemotherapy: Secondary | ICD-10-CM | POA: Diagnosis not present

## 2020-10-15 DIAGNOSIS — Z79899 Other long term (current) drug therapy: Secondary | ICD-10-CM | POA: Diagnosis not present

## 2020-10-15 DIAGNOSIS — Z5112 Encounter for antineoplastic immunotherapy: Secondary | ICD-10-CM | POA: Diagnosis not present

## 2020-10-15 MED ORDER — DIPHENHYDRAMINE HCL 50 MG/ML IJ SOLN: 25.0000 mg | Freq: Once | INTRAMUSCULAR | Status: DC

## 2020-10-15 MED ORDER — TRASTUZUMAB-ANNS CHEMO 150 MG IV SOLR
6.0000 mg/kg | Freq: Once | INTRAVENOUS | Status: AC
  Administered 2020-10-15: 420 mg via INTRAVENOUS
  Filled 2020-10-15: qty 20

## 2020-10-15 MED ORDER — SODIUM CHLORIDE 0.9 % IV SOLN
420.0000 mg | Freq: Once | INTRAVENOUS | Status: AC
  Administered 2020-10-15: 420 mg via INTRAVENOUS
  Filled 2020-10-15: qty 14

## 2020-10-15 MED ORDER — SODIUM CHLORIDE 0.9 % IV SOLN: Freq: Once | INTRAVENOUS | Status: AC

## 2020-10-15 MED ORDER — HEPARIN SOD (PORK) LOCK FLUSH 100 UNIT/ML IV SOLN
500.0000 [IU] | Freq: Once | INTRAVENOUS | Status: AC | PRN
  Administered 2020-10-15: 500 [IU]

## 2020-10-15 MED ORDER — ACETAMINOPHEN 325 MG PO TABS: 650.0000 mg | ORAL_TABLET | Freq: Once | ORAL | Status: DC

## 2020-10-15 MED ORDER — SODIUM CHLORIDE 0.9% FLUSH
10.0000 mL | INTRAVENOUS | Status: DC | PRN
  Administered 2020-10-15: 10 mL

## 2020-10-15 NOTE — Progress Notes (Signed)
1041: PT STABLE AT TIME OF DISCHARGE

## 2020-10-15 NOTE — Patient Instructions (Signed)
[facility]  Discharge Instructions: Thank you for choosing [facility] to provide your oncology and hematology care.  If you have a lab appointment with the [facility], please go directly to the [facility] and check in at the registration area.   Wear comfortable clothing and clothing appropriate for easy access to any Portacath or PICC line.   We strive to give you quality time with your provider. You may need to reschedule your appointment if you arrive late (15 or more minutes).  Arriving late affects you and other patients whose appointments are after yours.  Also, if you miss three or more appointments without notifying the office, you may be dismissed from the clinic at the provider's discretion.      For prescription refill requests, have your pharmacy contact our office and allow 72 hours for refills to be completed.    Today you received the following chemotherapy and/or immunotherapy agents Pertuzumab,Trastuzumab     To help prevent nausea and vomiting after your treatment, we encourage you to take your nausea medication as directed.  BELOW ARE SYMPTOMS THAT SHOULD BE REPORTED IMMEDIATELY: *FEVER GREATER THAN 100.4 F (38 C) OR HIGHER *CHILLS OR SWEATING *NAUSEA AND VOMITING THAT IS NOT CONTROLLED WITH YOUR NAUSEA MEDICATION *UNUSUAL SHORTNESS OF BREATH *UNUSUAL BRUISING OR BLEEDING *URINARY PROBLEMS (pain or burning when urinating, or frequent urination) *BOWEL PROBLEMS (unusual diarrhea, constipation, pain near the anus) TENDERNESS IN MOUTH AND THROAT WITH OR WITHOUT PRESENCE OF ULCERS (sore throat, sores in mouth, or a toothache) UNUSUAL RASH, SWELLING OR PAIN  UNUSUAL VAGINAL DISCHARGE OR ITCHING   Items with * indicate a potential emergency and should be followed up as soon as possible or go to the Emergency Department if any problems should occur.  Please show the CHEMOTHERAPY ALERT CARD or IMMUNOTHERAPY ALERT CARD at check-in  to the Emergency Department and triage nurse.  Should you have questions after your visit or need to cancel or reschedule your appointment, please contact [facility]  Dept: [phone]  and follow the prompts.  Office hours are 8:00 a.m. to 4:30 p.m. Monday - Friday. Please note that voicemails left after 4:00 p.m. may not be returned until the following business day.  We are closed weekends and major holidays. You have access to a nurse at all times for urgent questions. Please call the main number to the clinic Dept: [phone] and follow the prompts.  For any non-urgent questions, you may also contact your provider using MyChart. We now offer e-Visits for anyone 31 and older to request care online for non-urgent symptoms. For details visit [email or web address].   Also download the MyChart app! Go to the app store, search "MyChart", open the app, select [facility], and log in with your MyChart username and password.  Due to Covid, a mask is required upon entering the hospital/clinic. If you do not have a mask, one will be given to you upon arrival. For doctor visits, patients may have 1 support person aged 71 or older with them. For treatment visits, patients cannot have anyone with them due to current Covid guidelines and our immunocompromised population.

## 2020-10-19 ENCOUNTER — Other Ambulatory Visit: Payer: Self-pay | Admitting: Hematology and Oncology

## 2020-10-19 DIAGNOSIS — T50905A Adverse effect of unspecified drugs, medicaments and biological substances, initial encounter: Secondary | ICD-10-CM

## 2020-10-19 DIAGNOSIS — E876 Hypokalemia: Secondary | ICD-10-CM

## 2020-10-20 ENCOUNTER — Encounter: Payer: Self-pay | Admitting: Oncology

## 2020-10-20 ENCOUNTER — Encounter: Payer: Self-pay | Admitting: Hematology and Oncology

## 2020-10-29 ENCOUNTER — Other Ambulatory Visit: Payer: PRIVATE HEALTH INSURANCE

## 2020-10-29 ENCOUNTER — Ambulatory Visit: Payer: PRIVATE HEALTH INSURANCE | Admitting: Oncology

## 2020-10-29 DIAGNOSIS — I517 Cardiomegaly: Secondary | ICD-10-CM | POA: Diagnosis not present

## 2020-11-02 ENCOUNTER — Other Ambulatory Visit: Payer: Self-pay | Admitting: Pharmacist

## 2020-11-03 ENCOUNTER — Telehealth: Payer: Self-pay | Admitting: Hematology and Oncology

## 2020-11-03 ENCOUNTER — Inpatient Hospital Stay: Payer: Medicaid Other

## 2020-11-03 ENCOUNTER — Encounter: Payer: Self-pay | Admitting: Hematology and Oncology

## 2020-11-03 ENCOUNTER — Other Ambulatory Visit: Payer: Self-pay | Admitting: Hematology and Oncology

## 2020-11-03 ENCOUNTER — Inpatient Hospital Stay: Payer: Medicaid Other | Attending: Hematology and Oncology | Admitting: Hematology and Oncology

## 2020-11-03 DIAGNOSIS — Z5112 Encounter for antineoplastic immunotherapy: Secondary | ICD-10-CM | POA: Insufficient documentation

## 2020-11-03 DIAGNOSIS — C50512 Malignant neoplasm of lower-outer quadrant of left female breast: Secondary | ICD-10-CM | POA: Diagnosis not present

## 2020-11-03 DIAGNOSIS — G620 Drug-induced polyneuropathy: Secondary | ICD-10-CM

## 2020-11-03 DIAGNOSIS — Z170 Estrogen receptor positive status [ER+]: Secondary | ICD-10-CM | POA: Insufficient documentation

## 2020-11-03 DIAGNOSIS — D0512 Intraductal carcinoma in situ of left breast: Secondary | ICD-10-CM | POA: Insufficient documentation

## 2020-11-03 DIAGNOSIS — G629 Polyneuropathy, unspecified: Secondary | ICD-10-CM | POA: Insufficient documentation

## 2020-11-03 DIAGNOSIS — Z23 Encounter for immunization: Secondary | ICD-10-CM | POA: Insufficient documentation

## 2020-11-03 DIAGNOSIS — Z9221 Personal history of antineoplastic chemotherapy: Secondary | ICD-10-CM | POA: Insufficient documentation

## 2020-11-03 DIAGNOSIS — Z171 Estrogen receptor negative status [ER-]: Secondary | ICD-10-CM

## 2020-11-03 DIAGNOSIS — Z79899 Other long term (current) drug therapy: Secondary | ICD-10-CM | POA: Insufficient documentation

## 2020-11-03 DIAGNOSIS — T451X5A Adverse effect of antineoplastic and immunosuppressive drugs, initial encounter: Secondary | ICD-10-CM | POA: Diagnosis not present

## 2020-11-03 DIAGNOSIS — Z923 Personal history of irradiation: Secondary | ICD-10-CM | POA: Insufficient documentation

## 2020-11-03 LAB — COMPREHENSIVE METABOLIC PANEL
Albumin: 4.2 (ref 3.5–5.0)
Calcium: 9.6 (ref 8.7–10.7)

## 2020-11-03 LAB — HEPATIC FUNCTION PANEL
ALT: 12 (ref 7–35)
AST: 20 (ref 13–35)
Alkaline Phosphatase: 111 (ref 25–125)
Bilirubin, Total: 0.3

## 2020-11-03 LAB — BASIC METABOLIC PANEL
BUN: 19 (ref 4–21)
CO2: 107 — AB (ref 13–22)
Creatinine: 1.1 (ref 0.5–1.1)
Glucose: 160
Potassium: 3.7 (ref 3.4–5.3)
Sodium: 140 (ref 137–147)

## 2020-11-03 LAB — CBC AND DIFFERENTIAL
HCT: 36 (ref 36–46)
Hemoglobin: 12.1 (ref 12.0–16.0)
Neutrophils Absolute: 6.8
Platelets: 216 (ref 150–399)
WBC: 8.4

## 2020-11-03 LAB — CBC
MCV: 86 (ref 81–99)
RBC: 4.2 (ref 3.87–5.11)

## 2020-11-03 MED FILL — Pertuzumab Soln for IV Infusion 420 MG/14ML (30 MG/ML): INTRAVENOUS | Qty: 14 | Status: AC

## 2020-11-03 MED FILL — Trastuzumab-anns For IV Soln 150 MG: INTRAVENOUS | Qty: 20 | Status: AC

## 2020-11-03 NOTE — Progress Notes (Signed)
[facility]  [address] 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 She will continue with gabapentin three times daily and I will refill her oxycodone.   Breast cancer ([place]) She is doing well on maintenance trastuzumab and pertuzumab. She will proceed with cycle 14 this week. Most recent Echo was normal. She will return to clinic in 3 weeks for repeat evaluation.    The patient understands the plans discussed today and is in agreement with them.  She knows to contact our office if she develops concerns prior to her next appointment.     [name], NP  [facility] [address] [place] [address] Dept: [phone] Dept Fax: [phone]   No orders of the defined types were placed in this encounter.     CHIEF COMPLAINT:  CC: A 60 year old female with history of breast cancer here for 3 week evaluation prior to cycle 14 maintenance Herceptin/ Perjeta  Current Treatment:  Herceptin/ Perjeta IV every 3 weeks   HISTORY OF PRESENT ILLNESS:   Oncology History  Breast cancer ([place])  12/08/2019 Initial Diagnosis   Breast cancer ([place])   01/01/2020 Cancer Staging   Staging form: Breast, AJCC 8th Edition - Clinical stage from 01/01/2020: Stage IA (cT1c, cN0(sn), cM0, G3, ER-, PR-, HER2+) - Signed by Derwood Kaplan, MD on 01/09/2020   01/29/2020 -  Chemotherapy   Patient is on Treatment Plan : BREAST  Docetaxel + Carboplatin + Trastuzumab + Pertuzumab  (TCHP) q21d / Trastuzumab + Pertuzumab q21d        INTERVAL HISTORY:  Genessa is here today for repeat clinical assessment. She has been well since last visit. Her appetite has improved  and she is gaining weight. She continues to have neuropathy which is treated with three times daily gabapentin and she has begun using lidocaine spray on her toes with increased relief. She denies fever, chills, nausea or vomiting. She denies shortness of breath, chest pain or cough. She denies issue with bowel or bladder. CBC and CMP are unremarkable today. REVIEW OF SYSTEMS:  Review of Systems  Constitutional:  Positive for fatigue. Negative for appetite change, chills, diaphoresis, fever and unexpected weight change.  HENT:   Negative for hearing loss, lump/mass, mouth sores, nosebleeds, sore throat, tinnitus, trouble swallowing and voice change.   Eyes:  Negative for eye problems and icterus.  Respiratory:  Negative for chest tightness, cough, hemoptysis, shortness of breath and wheezing.   Cardiovascular:  Negative for chest pain, leg swelling and palpitations.  Gastrointestinal:  Negative for abdominal distention, abdominal pain, blood in stool, constipation, diarrhea, nausea, rectal pain and vomiting.  Endocrine: Negative for hot flashes.  Genitourinary:  Negative for bladder incontinence, difficulty urinating, dyspareunia, dysuria, frequency, hematuria and nocturia.   Musculoskeletal:  Negative for arthralgias, back pain, flank pain, gait problem, myalgias, neck pain and neck stiffness.  Skin:  Negative for itching, rash and wound.  Neurological:  Positive for numbness. Negative for dizziness, extremity weakness, gait problem, headaches, light-headedness, seizures and speech difficulty.  Hematological:  Negative for adenopathy. Does not bruise/bleed easily.  Psychiatric/Behavioral:  Negative for confusion, decreased concentration, depression, sleep disturbance and suicidal ideas. The patient is not nervous/anxious.  VITALS:  Blood pressure 121/61, pulse 90, temperature 97.6 F (36.4 C), temperature source Oral, resp. rate 18, height 5' 9" (1.753 m), weight 169 lb 9.6 oz (76.9 kg), SpO2  97 %.  Wt Readings from Last 3 Encounters:  11/03/20 169 lb 9.6 oz (76.9 kg)  10/15/20 167 lb (75.8 kg)  10/08/20 166 lb 11.2 oz (75.6 kg)    Body mass index is 25.05 kg/m.  Performance status (ECOG): 1 - Symptomatic but completely ambulatory  PHYSICAL EXAM:  Physical Exam Constitutional:      General: She is not in acute distress.    Appearance: Normal appearance. She is normal weight. She is not ill-appearing, toxic-appearing or diaphoretic.  HENT:     Head: Normocephalic and atraumatic.     Nose: Nose normal. No congestion or rhinorrhea.     Mouth/Throat:     Mouth: Mucous membranes are moist.     Pharynx: Oropharynx is clear. No oropharyngeal exudate or posterior oropharyngeal erythema.  Eyes:     General: No scleral icterus.       Right eye: No discharge.        Left eye: No discharge.     Extraocular Movements: Extraocular movements intact.     Conjunctiva/sclera: Conjunctivae normal.     Pupils: Pupils are equal, round, and reactive to light.  Neck:     Vascular: No carotid bruit.  Cardiovascular:     Rate and Rhythm: Normal rate and regular rhythm.     Heart sounds: No murmur heard.   No friction rub. No gallop.  Pulmonary:     Effort: Pulmonary effort is normal. No respiratory distress.     Breath sounds: Normal breath sounds. No stridor. No wheezing, rhonchi or rales.  Chest:     Chest wall: No tenderness.  Abdominal:     General: Abdomen is flat. Bowel sounds are normal. There is no distension.     Palpations: There is no mass.     Tenderness: There is no abdominal tenderness. There is no right CVA tenderness, left CVA tenderness, guarding or rebound.     Hernia: No hernia is present.  Musculoskeletal:        General: No swelling, tenderness, deformity or signs of injury. Normal range of motion.     Cervical back: Normal range of motion and neck supple. No rigidity or tenderness.     Right lower leg: No edema.     Left lower leg: No edema.   Lymphadenopathy:     Cervical: No cervical adenopathy.  Skin:    General: Skin is warm and dry.     Capillary Refill: Capillary refill takes less than 2 seconds.     Coloration: Skin is not jaundiced or pale.     Findings: No bruising, erythema, lesion or rash.  Neurological:     General: No focal deficit present.     Mental Status: She is alert and oriented to person, place, and time. Mental status is at baseline.     Cranial Nerves: No cranial nerve deficit.     Sensory: No sensory deficit.     Motor: No weakness.     Coordination: Coordination normal.     Gait: Gait normal.     Deep Tendon Reflexes: Reflexes normal.  Psychiatric:        Mood and Affect: Mood normal.        Behavior: Behavior normal.        Thought Content: Thought content normal.  Judgment: Judgment normal.    LABS:   CBC Latest Ref Rng & Units 11/03/2020 10/08/2020 09/17/2020  WBC - 8.4 6.7 6.4  Hemoglobin 12.0 - 16.0 12.1 12.1 11.9(A)  Hematocrit 36 - 46 36 36 35(A)  Platelets 150 - 399 216 204 208   CMP Latest Ref Rng & Units 11/03/2020 10/08/2020 09/17/2020  BUN 4 - _0 Creatinine 0.5 - 1.1 1.1 1.0 0.9  Sodium 137 - 147 140 140 143  Potassium 3.4 - 5.3 3.7 3.6 3.9  Chloride 99 - 108 - 108 109(A)  CO2 13 - 22 107(A) 23(A) 24(A)  Calcium 8.7 - 10.7 9.6 9.5 9.6  Alkaline Phos 25 - 125 111 100 93  AST 13 - 35 _1 ALT 7 - 35 _2 No results found for: CEA1 / No results found for: CEA1 No results found for: PSA1 No results found for: WER154 No results found for: MGQ676  No results found for: TOTALPROTELP, ALBUMINELP, A1GS, A2GS, BETS, BETA2SER, GAMS, MSPIKE, SPEI No results found for: TIBC, FERRITIN, IRONPCTSAT No results found for: LDH  STUDIES:  No results found.    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 She has a 32.00 pack-year smoking history. She has never used smokeless tobacco. She reports that she does not drink alcohol and does not use drugs.The patient is alone  today.  Allergies:  Allergies  Allergen Reactions   Metformin And Related Other (See Comments)    Twitching, nausea    Current Medications: Current Outpatient Medications  Medication Sig Dispense Refill   amLODipine (NORVASC) 5 MG tablet Take 1 tablet (5 mg total) by mouth daily. 30 tablet 0   Blood Glucose Monitoring Suppl (TRUE METRIX METER) DEVI dx 11.9 (Medicaid preferred) USE FOR TESTING BLOOD SUGARS when Fasting daily     COVID-19 mRNA vaccine, Moderna, 100 MCG/0.5ML injection USE AS DIRECTED .25 mL 0   gabapentin (NEURONTIN) 300 MG capsule Take 1 capsule (300 mg total) by mouth 3 (three) times daily. 90 capsule 5   GNP ULTICARE PEN NEEDLES 32G X 4 MM MISC      hydrochlorothiazide (HYDRODIURIL) 25 MG tablet TAKE 1 TABLET (25 MG TOTAL) BY MOUTH DAILY. 30 tablet 2   insulin glargine (LANTUS) 100 UNIT/ML Solostar Pen Inject into the skin. 10 units in the morning and 40 units at bedtime     JANUVIA 100 MG tablet Take 100 mg by mouth daily.     KLOR-CON M20 20 MEQ tablet TAKE 1 TABLET BY MOUTH TWICE A DAY 180 tablet 0   losartan (COZAAR) 50 MG tablet TAKE 1 TABLET BY MOUTH EVERY DAY 30 tablet 2   ondansetron (ZOFRAN) 4 MG tablet Take 1 tablet (4 mg total) by  mouth every 4 (four) hours as needed for nausea. 90 tablet 3   oxyCODONE 10 MG TABS Take 1-1.5 tablets (10-15 mg total) by  mouth every 4 (four) hours as needed for severe pain. 120 tablet 0   prochlorperazine (COMPAZINE) 10 MG tablet Take 1 tablet (10 mg total) by mouth every 6 (six) hours as needed for nausea or vomiting. 90 tablet 3   TRUE METRIX BLOOD GLUCOSE TEST test strip SMARTSIG:1 Via Meter Daily PRN     TRUEplus Lancets 28G MISC SMARTSIG:1 Topical Daily PRN     No current facility-administered medications for this visit.

## 2020-11-03 NOTE — Assessment & Plan Note (Signed)
She is doing well on maintenance trastuzumab and pertuzumab. She will proceed with cycle 14 this week. Most recent Echo was normal. She will return to clinic in 3 weeks for repeat evaluation.

## 2020-11-03 NOTE — Assessment & Plan Note (Signed)
This is improving with gabapentin and patient using lidocaine spray on her toes. She will continue with gabapentin three times daily and I will refill her oxycodone.

## 2020-11-03 NOTE — Telephone Encounter (Signed)
Per 10/4 LOS, patient's Appts already scheduled

## 2020-11-04 ENCOUNTER — Inpatient Hospital Stay: Payer: Medicaid Other

## 2020-11-04 ENCOUNTER — Other Ambulatory Visit: Payer: Self-pay

## 2020-11-04 VITALS — BP 125/64 | HR 90 | Temp 97.4°F | Resp 18 | Ht 69.0 in | Wt 168.1 lb

## 2020-11-04 DIAGNOSIS — Z170 Estrogen receptor positive status [ER+]: Secondary | ICD-10-CM | POA: Diagnosis not present

## 2020-11-04 DIAGNOSIS — G629 Polyneuropathy, unspecified: Secondary | ICD-10-CM | POA: Diagnosis not present

## 2020-11-04 DIAGNOSIS — Z923 Personal history of irradiation: Secondary | ICD-10-CM | POA: Diagnosis not present

## 2020-11-04 DIAGNOSIS — Z23 Encounter for immunization: Secondary | ICD-10-CM | POA: Diagnosis not present

## 2020-11-04 DIAGNOSIS — Z5112 Encounter for antineoplastic immunotherapy: Secondary | ICD-10-CM | POA: Diagnosis not present

## 2020-11-04 DIAGNOSIS — D0512 Intraductal carcinoma in situ of left breast: Secondary | ICD-10-CM | POA: Diagnosis present

## 2020-11-04 DIAGNOSIS — Z79899 Other long term (current) drug therapy: Secondary | ICD-10-CM | POA: Diagnosis not present

## 2020-11-04 DIAGNOSIS — Z9221 Personal history of antineoplastic chemotherapy: Secondary | ICD-10-CM | POA: Diagnosis not present

## 2020-11-04 DIAGNOSIS — C50512 Malignant neoplasm of lower-outer quadrant of left female breast: Secondary | ICD-10-CM

## 2020-11-04 MED ORDER — ACETAMINOPHEN 325 MG PO TABS: 650.0000 mg | ORAL_TABLET | Freq: Once | ORAL | Status: DC

## 2020-11-04 MED ORDER — DIPHENHYDRAMINE HCL 50 MG/ML IJ SOLN: 25.0000 mg | Freq: Once | INTRAMUSCULAR | Status: DC

## 2020-11-04 MED ORDER — HEPARIN SOD (PORK) LOCK FLUSH 100 UNIT/ML IV SOLN
500.0000 [IU] | Freq: Once | INTRAVENOUS | Status: AC | PRN
  Administered 2020-11-04: 500 [IU]

## 2020-11-04 MED ORDER — SODIUM CHLORIDE 0.9 % IV SOLN: Freq: Once | INTRAVENOUS | Status: AC

## 2020-11-04 MED ORDER — SODIUM CHLORIDE 0.9 % IV SOLN
420.0000 mg | Freq: Once | INTRAVENOUS | Status: AC
  Administered 2020-11-04: 420 mg via INTRAVENOUS
  Filled 2020-11-04: qty 14

## 2020-11-04 MED ORDER — INFLUENZA VAC SPLIT QUAD 0.5 ML IM SUSY
0.5000 mL | PREFILLED_SYRINGE | Freq: Once | INTRAMUSCULAR | Status: AC
  Administered 2020-11-04: 0.5 mL via INTRAMUSCULAR
  Filled 2020-11-04: qty 0.5

## 2020-11-04 MED ORDER — SODIUM CHLORIDE 0.9 % IV SOLN
6.0000 mg/kg | Freq: Once | INTRAVENOUS | Status: AC
  Administered 2020-11-04: 420 mg via INTRAVENOUS
  Filled 2020-11-04: qty 20

## 2020-11-04 MED ORDER — SODIUM CHLORIDE 0.9% FLUSH
10.0000 mL | INTRAVENOUS | Status: DC | PRN
  Administered 2020-11-04: 10 mL

## 2020-11-04 NOTE — Patient Instructions (Signed)
[facility]  Discharge Instructions: Thank you for choosing [facility] to provide your oncology and hematology care.  If you have a lab appointment with the [facility], please go directly to the [facility] and check in at the registration area.   Wear comfortable clothing and clothing appropriate for easy access to any Portacath or PICC line.   We strive to give you quality time with your provider. You may need to reschedule your appointment if you arrive late (15 or more minutes).  Arriving late affects you and other patients whose appointments are after yours.  Also, if you miss three or more appointments without notifying the office, you may be dismissed from the clinic at the provider's discretion.      For prescription refill requests, have your pharmacy contact our office and allow 72 hours for refills to be completed.    Today you received the following chemotherapy and/or immunotherapy agents perjeta/kanjinti   To help prevent nausea and vomiting after your treatment, we encourage you to take your nausea medication as directed.  BELOW ARE SYMPTOMS THAT SHOULD BE REPORTED IMMEDIATELY: *FEVER GREATER THAN 100.4 F (38 C) OR HIGHER *CHILLS OR SWEATING *NAUSEA AND VOMITING THAT IS NOT CONTROLLED WITH YOUR NAUSEA MEDICATION *UNUSUAL SHORTNESS OF BREATH *UNUSUAL BRUISING OR BLEEDING *URINARY PROBLEMS (pain or burning when urinating, or frequent urination) *BOWEL PROBLEMS (unusual diarrhea, constipation, pain near the anus) TENDERNESS IN MOUTH AND THROAT WITH OR WITHOUT PRESENCE OF ULCERS (sore throat, sores in mouth, or a toothache) UNUSUAL RASH, SWELLING OR PAIN  UNUSUAL VAGINAL DISCHARGE OR ITCHING   Items with * indicate a potential emergency and should be followed up as soon as possible or go to the Emergency Department if any problems should occur.  Please show the CHEMOTHERAPY ALERT CARD or IMMUNOTHERAPY ALERT CARD at check-in to the  Emergency Department and triage nurse.  Should you have questions after your visit or need to cancel or reschedule your appointment, please contact [facility]  Dept: [phone]  and follow the prompts.  Office hours are 8:00 a.m. to 4:30 p.m. Monday - Friday. Please note that voicemails left after 4:00 p.m. may not be returned until the following business day.  We are closed weekends and major holidays. You have access to a nurse at all times for urgent questions. Please call the main number to the clinic Dept: [phone] and follow the prompts.  For any non-urgent questions, you may also contact your provider using MyChart. We now offer e-Visits for anyone 67 and older to request care online for non-urgent symptoms. For details visit [email or web address].   Also download the MyChart app! Go to the app store, search "MyChart", open the app, select , and log in with your MyChart username and password.  Due to Covid, a mask is required upon entering the hospital/clinic. If you do not have a mask, one will be given to you upon arrival. For doctor visits, patients may have 1 support person aged 38 or older with them. For treatment visits, patients cannot have anyone with them due to current Covid guidelines and our immunocompromised population.   Pertuzumab injection What is this medication? PERTUZUMAB (per TOOZ ue mab) is a monoclonal antibody. It is used to treat breast cancer. This medicine may be used for other purposes; ask your health care provider or pharmacist if you have questions. COMMON BRAND NAME(S): PERJETA What should I tell my care team before I  take this medication? They need to know if you have any of these conditions: heart disease heart failure high blood pressure history of irregular heart beat recent or ongoing radiation therapy an unusual or allergic reaction to pertuzumab, other medicines, foods, dyes, or preservatives pregnant or  trying to get pregnant breast-feeding How should I use this medication? This medicine is for infusion into a vein. It is given by a health care professional in a hospital or clinic setting. Talk to your pediatrician regarding the use of this medicine in children. Special care may be needed. Overdosage: If you think you have taken too much of this medicine contact a poison control center or emergency room at once. NOTE: This medicine is only for you. Do not share this medicine with others. What if I miss a dose? It is important not to miss your dose. Call your doctor or health care professional if you are unable to keep an appointment. What may interact with this medication? Interactions are not expected. Give your health care provider a list of all the medicines, herbs, non-prescription drugs, or dietary supplements you use. Also tell them if you smoke, drink alcohol, or use illegal drugs. Some items may interact with your medicine. This list may not describe all possible interactions. Give your health care provider a list of all the medicines, herbs, non-prescription drugs, or dietary supplements you use. Also tell them if you smoke, drink alcohol, or use illegal drugs. Some items may interact with your medicine. What should I watch for while using this medication? Your condition will be monitored carefully while you are receiving this medicine. Report any side effects. Continue your course of treatment even though you feel ill unless your doctor tells you to stop. Do not become pregnant while taking this medicine or for 7 months after stopping it. Women should inform their doctor if they wish to become pregnant or think they might be pregnant. Women of child-bearing potential will need to have a negative pregnancy test before starting this medicine. There is a potential for serious side effects to an unborn child. Talk to your health care professional or pharmacist for more information. Do not  breast-feed an infant while taking this medicine or for 7 months after stopping it. Women must use effective birth control with this medicine. Call your doctor or health care professional for advice if you get a fever, chills or sore throat, or other symptoms of a cold or flu. Do not treat yourself. Try to avoid being around people who are sick. You may experience fever, chills, and headache during the infusion. Report any side effects during the infusion to your health care professional. What side effects may I notice from receiving this medication? Side effects that you should report to your doctor or health care professional as soon as possible: breathing problems chest pain or palpitations dizziness feeling faint or lightheaded fever or chills skin rash, itching or hives sore throat swelling of the face, lips, or tongue swelling of the legs or ankles unusually weak or tired Side effects that usually do not require medical attention (report to your doctor or health care professional if they continue or are bothersome): diarrhea hair loss nausea, vomiting tiredness This list may not describe all possible side effects. Call your doctor for medical advice about side effects. You may report side effects to FDA at 1-800-FDA-1088. Where should I keep my medication? This drug is given in a hospital or clinic and will not be stored at home. NOTE:  This sheet is a summary. It may not cover all possible information. If you have questions about this medicine, talk to your doctor, pharmacist, or health care provider.  2022 Elsevier/Gold Standard (2015-02-19 12:08:50) Trastuzumab injection for infusion What is this medication? TRASTUZUMAB (tras TOO zoo mab) is a monoclonal antibody. It is used to treat breast cancer and stomach cancer. This medicine may be used for other purposes; ask your health care provider or pharmacist if you have questions. COMMON BRAND NAME(S): Herceptin, Janae Bridgeman, Ontruzant, Trazimera What should I tell my care team before I take this medication? They need to know if you have any of these conditions: heart disease heart failure lung or breathing disease, like asthma an unusual or allergic reaction to trastuzumab, benzyl alcohol, or other medications, foods, dyes, or preservatives pregnant or trying to get pregnant breast-feeding How should I use this medication? This drug is given as an infusion into a vein. It is administered in a hospital or clinic by a specially trained health care professional. Talk to your pediatrician regarding the use of this medicine in children. This medicine is not approved for use in children. Overdosage: If you think you have taken too much of this medicine contact a poison control center or emergency room at once. NOTE: This medicine is only for you. Do not share this medicine with others. What if I miss a dose? It is important not to miss a dose. Call your doctor or health care professional if you are unable to keep an appointment. What may interact with this medication? This medicine may interact with the following medications: certain types of chemotherapy, such as daunorubicin, doxorubicin, epirubicin, and idarubicin This list may not describe all possible interactions. Give your health care provider a list of all the medicines, herbs, non-prescription drugs, or dietary supplements you use. Also tell them if you smoke, drink alcohol, or use illegal drugs. Some items may interact with your medicine. What should I watch for while using this medication? Visit your doctor for checks on your progress. Report any side effects. Continue your course of treatment even though you feel ill unless your doctor tells you to stop. Call your doctor or health care professional for advice if you get a fever, chills or sore throat, or other symptoms of a cold or flu. Do not treat yourself. Try to avoid being around people  who are sick. You may experience fever, chills and shaking during your first infusion. These effects are usually mild and can be treated with other medicines. Report any side effects during the infusion to your health care professional. Fever and chills usually do not happen with later infusions. Do not become pregnant while taking this medicine or for 7 months after stopping it. Women should inform their doctor if they wish to become pregnant or think they might be pregnant. Women of child-bearing potential will need to have a negative pregnancy test before starting this medicine. There is a potential for serious side effects to an unborn child. Talk to your health care professional or pharmacist for more information. Do not breast-feed an infant while taking this medicine or for 7 months after stopping it. Women must use effective birth control with this medicine. What side effects may I notice from receiving this medication? Side effects that you should report to your doctor or health care professional as soon as possible: allergic reactions like skin rash, itching or hives, swelling of the face, lips, or tongue chest pain or palpitations cough  dizziness feeling faint or lightheaded, falls fever general ill feeling or flu-like symptoms signs of worsening heart failure like breathing problems; swelling in your legs and feet unusually weak or tired Side effects that usually do not require medical attention (report to your doctor or health care professional if they continue or are bothersome): bone pain changes in taste diarrhea joint pain nausea/vomiting weight loss This list may not describe all possible side effects. Call your doctor for medical advice about side effects. You may report side effects to FDA at 1-800-FDA-1088. Where should I keep my medication? This drug is given in a hospital or clinic and will not be stored at home. NOTE: This sheet is a summary. It may not cover all  possible information. If you have questions about this medicine, talk to your doctor, pharmacist, or health care provider.  2022 Elsevier/Gold Standard (2016-01-12 14:37:52)

## 2020-11-05 ENCOUNTER — Inpatient Hospital Stay: Payer: Medicaid Other

## 2020-11-13 NOTE — Progress Notes (Signed)
[facility]  [address] [place],  [place]  [address] [phone]  Clinic Day:  11/19/2020  Referring physician: Carolynne Edouard, MD  This document serves as a record of services personally performed by Hosie Poisson, MD. It was created on their behalf by Curry,Lauren E, a trained medical scribe. The creation of this record is based on the scribe's personal observations and the provider's statements to them.  ASSESSMENT & PLAN:   Assessment & Plan: 1. 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 She completed chemotherapy at the end of April 2022 and radiation therapy in June.  We will continue with maintenance tastuzumab/pertuzumab every 3 weeks for a total of 1 year of HER2 targeted therapy. Most recent ECHO from September was normal with an EF between 55-60%.   2.  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 She has persistent numbness, but improvement in her weakness and occasional pain. This is improving with gabapentin 300 mg TID and lidocaine spray on her toes.  I will refill her oxycodone.   Plan: She will proceed with a 15th cycle of maintenance trastuzumab/pertuzumab next week. We will see her back in 3 weeks with CBC and CMP prior to cycle 16. She will complete the trastuzumab/pertuzumab by early December. The patient understands the plans discussed today and is in agreement with them.  She knows to contact our office if she develops concerns prior to her next appointment.  I provided 15 minutes of face-to-face time during this this encounter and > 50% was spent counseling as documented under my assessment and plan.    [facility] [address] [place] [address] Dept: [phone] Dept Fax: [phone]   No orders of the defined types were placed  in this encounter.     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  Current Treatment:  Herceptin/ Perjeta IV every 3 weeks   HISTORY OF PRESENT ILLNESS:   Oncology History  Breast cancer ([facility])  09/09/2019 Mammogram   SCREENING BILATERAL MAMMOGRAM: Further evaluation is suggested for possible mass with calcifications and possible dilated ducts in the left breast.   10/01/2019 Mammogram   DIAGNOSTIC UNILATERAL LEFT MAMMOGRAM AND LEFT BREAST ULTRASOUND: A 1.6 cm elongated mass containing pleomorphic calcifications in the 7 o'clock position of the left breast, highly suspicious for breast malignancy.  Tissue sampling is recommended.  A 5 mm hypoechoic, indeterminate mass containing 2 benign-appearing calcificaitons in the 2 o'clock position of the left breast.  Several retroareolar ecstatic ducts containing internal debris or possibly tissue.   10/21/2019 Pathology Results   1.  Breast, left, needle core biopsy, 5 o'clock, 5 cmfn:  -  Invasive ductal carcinoma, grade 3, and high grade ductal carcinoma in situ with necrosis.  -  Tumor involves multiple cores and measures 5 mm in maximum extent in a single core 2.  Breast, left, needle core biopsy, 2 o'clock:  -  Fibrocystic changes, including sclerosing adenosis with calcifications  -  No atypia, in situ or invasive malignancy identified 3.  Breast, left, needle core biopsy, 11:30 o'clock:  -  Fragmented intraductal papilloma and dilated duct  -  No atypia, in situ or invasive malignancy identified  HER2: Positive  Ration of HER2/CEN17 signals: 2.23  Average HER2 copy #/  cell: 4.23 ER: Negative 0% PR: Negative 0% Ki67: 50%   11/13/2019 Breast MRI   BILATERAL BREAST MRI WITH AND WITHOUT CONTRAST: 1.  Question abnormal lymph node in the left axilla.   2, 1.5 cm enhancement in the left breast approximately 6 o'clock position with associated biopsy clip  consistent with recent biopsy cancer.  3. 0.6 cm masslike enhancement in the retroareolar left breast with associated biopsy clip artifact correlating to recently biopsy proven papilloma.   12/08/2019 Initial Diagnosis   Breast cancer ([facility])   12/23/2019 Pathology Results   1.  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  Lymph node, sentinel, biopsy, left:             -  One benign, reactive lymph node with focal biopsy site reaction (0/1)  Tumor size: 1.2 x 1.0 x 0.8 cm Histologic grade: 3   01/01/2020 Cancer Staging   Staging form: Breast, AJCC 8th Edition - Clinical stage from 01/01/2020: Stage IA (cT1c, cN0(sn), cM0, G3, ER-, PR-, HER2+) - Signed by Derwood Kaplan, MD on 01/09/2020   01/29/2020 -  Chemotherapy   Patient is on Treatment Plan : BREAST  Docetaxel + Carboplatin + Trastuzumab + Pertuzumab  (TCHP) q21d (COMPLETED TCHP ON 05/20/2020) / Trastuzumab + Pertuzumab q21d      06/15/2020 - 07/21/2020 Radiation Therapy   5000 cGy in 25 fractions    10/14/2020 Mammogram   DIAGNOSTIC BILATERAL MAMMOGRAM: No evidence of malignancy in either breast.    10/29/2020 Echocardiogram   Overall left ventricular systolic function is normal with, an EF between 55 - 60 %.       INTERVAL HISTORY:  Brittney Burgess is here for routine follow up prior to a 15th cycle of trastuzumab/pertuzumab. She has special gloves and socks to help improve her neuropathy. She continues oxycodone 10 mg, and I will refill this today. Otherwise, she is doing well. Blood counts and chemistries are unremarkable except for a BUN of 23, and a creatinine of 1.1, stable. Her  appetite is good, and she has gained 2 pounds since her last visit.  She denies fever, chills or  other signs of infection.  She denies nausea, vomiting, bowel issues, or abdominal pain.  She denies sore throat, cough, dyspnea, or chest pain.  REVIEW OF SYSTEMS:  Review of Systems  Constitutional: Negative.  Negative for appetite change, chills, fatigue, fever and unexpected weight change.  HENT:  Negative.    Eyes: Negative.   Respiratory: Negative.  Negative for chest tightness, cough, hemoptysis, shortness of breath and wheezing.   Cardiovascular: Negative.  Negative for chest pain, leg swelling and palpitations.  Gastrointestinal: Negative.  Negative for abdominal distention, abdominal pain, blood in stool, constipation, diarrhea, nausea and vomiting.  Endocrine: Negative.   Genitourinary: Negative.  Negative for difficulty urinating, dysuria, frequency and hematuria.   Musculoskeletal: Negative.  Negative for arthralgias, back pain, flank pain, gait problem and myalgias.  Skin: Negative.   Neurological:  Positive for numbness (neuropathy of the hands and feet). Negative for dizziness, extremity weakness, gait problem, headaches, light-headedness, seizures and speech difficulty.  Hematological: Negative.   Psychiatric/Behavioral: Negative.  Negative for depression and sleep disturbance. The patient is not nervous/anxious.     VITALS:  Blood pressure 133/78, pulse 91, temperature 97.8 F (36.6 C), temperature source Oral, resp. rate 18, height _0  (1.753 m), weight 171 lb 14.4 oz (78 kg), SpO2 98 %.  Wt Readings from Last 3 Encounters:  11/19/20 171 lb 14.4 oz (78 kg)  11/04/20 168 lb 1.9 oz (76.3 kg)  11/03/20 169 lb 9.6 oz (76.9 kg)    Body mass index is 25.39 kg/m.  Performance status (ECOG): 1 - Symptomatic but completely ambulatory  PHYSICAL EXAM:  Physical Exam Constitutional:      General: She is not in acute distress.    Appearance: Normal appearance. She is normal weight.  HENT:     Head: Normocephalic and atraumatic.  Eyes:     General: No scleral icterus.     Extraocular Movements: Extraocular movements intact.     Conjunctiva/sclera: Conjunctivae normal.     Pupils: Pupils are equal, round, and reactive to light.  Cardiovascular:     Rate and Rhythm: Normal rate and regular rhythm.     Pulses: Normal pulses.     Heart sounds: Normal heart sounds. No murmur heard.   No friction rub. No gallop.  Pulmonary:     Effort: Pulmonary effort is normal. No respiratory distress.     Breath sounds: Normal breath sounds.  Chest:     Comments: She still has post radiation changes of the left side. No masses in either breast. Abdominal:     General: Bowel sounds are normal. There is no distension.     Palpations: Abdomen is soft. There is no hepatomegaly, splenomegaly or mass.     Tenderness: There is no abdominal tenderness.  Musculoskeletal:        General: Normal range of motion.     Cervical back: Normal range of motion and neck supple.     Right lower leg: No edema.     Left lower leg: No edema.  Lymphadenopathy:     Cervical: No cervical adenopathy.  Skin:    General: Skin is warm and dry.  Neurological:     General: No focal deficit present.     Mental Status: She is alert and oriented to person, place, and time. Mental status is at baseline.  Psychiatric:        Mood and Affect: Mood normal.        Behavior: Behavior normal.        Thought Content: Thought content normal.        Judgment: Judgment normal.    LABS:   CBC Latest Ref Rng & Units 11/19/2020 11/03/2020 10/08/2020  WBC - 7.4 8.4 6.7  Hemoglobin 12.0 - 16.0 12.5 12.1 12.1  Hematocrit 36 - 46 37 36 36  Platelets 150 - 399 221 216 204   CMP Latest Ref Rng & Units 11/19/2020 11/03/2020 10/08/2020  BUN 4 - 21 23(A) 19 20  Creatinine 0.5 - 1.1 1.1 1.1 1.0  Sodium 137 - 147 142 140 140  Potassium 3.4 - 5.3 4.0 3.7 3.6  Chloride 99 - 108 111(A) - 108  CO2 13 - 22 23(A) 107(A) 23(A)  Calcium 8.7 - 10.7 9.7 9.6 9.5  Alkaline Phos 25 - 125 127(A) 111  100  AST 13 - 35 _0 ALT 7 - 35 _1 STUDIES:  No results found.    HISTORY:   Allergies:  Allergies  Allergen Reactions   Metformin And Related Other (See Comments)    Twitching, nausea    Current Medications: Current Outpatient Medications  Medication Sig Dispense Refill   amLODipine (NORVASC) 5 MG tablet Take 1 tablet (5 mg total) by mouth daily. 30 tablet 0   Blood Glucose Monitoring Suppl (TRUE METRIX METER) DEVI dx 11.9 (Medicaid preferred) USE FOR TESTING BLOOD SUGARS when Fasting daily     COVID-19 mRNA vaccine, Moderna, 100 MCG/0.5ML injection USE AS DIRECTED .25 mL 0   gabapentin (NEURONTIN) 300 MG capsule Take 1 capsule (300 mg total) by mouth 3 (three) times daily. 90 capsule 5   GNP ULTICARE PEN NEEDLES 32G X 4 MM MISC      hydrochlorothiazide (HYDRODIURIL) 25 MG tablet TAKE 1 TABLET (25 MG TOTAL) BY MOUTH DAILY. 30 tablet 2   insulin glargine (LANTUS) 100 UNIT/ML Solostar Pen Inject into the skin. 10 units in the morning and 40 units at bedtime     JANUVIA 100 MG tablet Take 100 mg by mouth daily.     KLOR-CON M20 20 MEQ tablet TAKE 1 TABLET BY MOUTH TWICE A DAY 180 tablet 0   losartan (COZAAR) 50 MG tablet TAKE 1 TABLET BY MOUTH EVERY DAY 30 tablet 2   ondansetron (ZOFRAN) 4 MG tablet Take 1 tablet (4 mg total) by mouth every 4 (four) hours as needed for nausea. 90 tablet 3   oxyCODONE 10 MG TABS Take 1-1.5 tablets (10-15 mg total) by mouth every 4 (four) hours as needed for severe pain. 120 tablet 0   prochlorperazine (COMPAZINE) 10 MG tablet Take 1 tablet (10 mg total) by mouth every 6 (six) hours as needed for nausea or vomiting. 90 tablet 3   TRUE METRIX BLOOD GLUCOSE TEST test strip SMARTSIG:1 Via Meter Daily PRN     TRUEplus Lancets 28G MISC SMARTSIG:1 Topical Daily PRN     No current facility-administered medications for this visit.    I, Rita Ohara, am acting as scribe for Derwood Kaplan, MD  I have reviewed this report as typed by the medical scribe, and it  is complete and accurate.

## 2020-11-19 ENCOUNTER — Inpatient Hospital Stay: Payer: Medicaid Other

## 2020-11-19 ENCOUNTER — Encounter: Payer: Self-pay | Admitting: Oncology

## 2020-11-19 ENCOUNTER — Other Ambulatory Visit: Payer: Self-pay | Admitting: Hematology and Oncology

## 2020-11-19 ENCOUNTER — Inpatient Hospital Stay (INDEPENDENT_AMBULATORY_CARE_PROVIDER_SITE_OTHER): Payer: Medicaid Other | Admitting: Oncology

## 2020-11-19 ENCOUNTER — Other Ambulatory Visit: Payer: Self-pay | Admitting: Oncology

## 2020-11-19 VITALS — BP 133/78 | HR 91 | Temp 97.8°F | Resp 18 | Ht 69.0 in | Wt 171.9 lb

## 2020-11-19 DIAGNOSIS — Z171 Estrogen receptor negative status [ER-]: Secondary | ICD-10-CM | POA: Diagnosis not present

## 2020-11-19 DIAGNOSIS — C50512 Malignant neoplasm of lower-outer quadrant of left female breast: Secondary | ICD-10-CM | POA: Diagnosis not present

## 2020-11-19 LAB — BASIC METABOLIC PANEL
BUN: 23 — AB (ref 4–21)
CO2: 23 — AB (ref 13–22)
Chloride: 111 — AB (ref 99–108)
Creatinine: 1.1 (ref 0.5–1.1)
Glucose: 126
Potassium: 4 (ref 3.4–5.3)
Sodium: 142 (ref 137–147)

## 2020-11-19 LAB — CBC
MCV: 85 (ref 81–99)
RBC: 4.39 (ref 3.87–5.11)

## 2020-11-19 LAB — CBC AND DIFFERENTIAL
HCT: 37 (ref 36–46)
Hemoglobin: 12.5 (ref 12.0–16.0)
Neutrophils Absolute: 5.62
Platelets: 221 (ref 150–399)
WBC: 7.4

## 2020-11-19 LAB — HEPATIC FUNCTION PANEL
ALT: 13 (ref 7–35)
AST: 20 (ref 13–35)
Alkaline Phosphatase: 127 — AB (ref 25–125)
Bilirubin, Total: 0.4

## 2020-11-19 LAB — MAGNESIUM: Magnesium: 2

## 2020-11-19 LAB — COMPREHENSIVE METABOLIC PANEL
Albumin: 4.3 (ref 3.5–5.0)
Calcium: 9.7 (ref 8.7–10.7)

## 2020-11-19 NOTE — Progress Notes (Unsigned)
HEMATOLOGY-ONCOLOGY ELECTRONIC VISIT PROGRESS NOTE  Patient Care Team: Carolynne Edouard, MD as PCP - General (Family Medicine) Derwood Kaplan, MD as Consulting Physician (Oncology) Pollyann Samples, MD as Consulting Physician (General Surgery) Gatha Mayer, MD as Consulting Physician (Radiation Oncology)  I connected with the patient via telephone conference and verified that I am speaking with the correct person using two identifiers. The patient's location is at home and I am providing care from the [facility] I discussed the limitations, risks, security and privacy concerns of performing an evaluation and management service by e-visits and the availability of in person appointments.  I also discussed with the patient that there may be a patient responsible charge related to this service. The patient expressed understanding and agreed to proceed.   ASSESSMENT & PLAN:  No problem-specific Assessment & Plan notes found for this encounter.   No orders of the defined types were placed in this encounter.   INTERVAL HISTORY: Please see below for problem oriented charting. The purpose of today's discussion is ***   SUMMARY OF ONCOLOGIC HISTORY: Oncology History  Breast cancer ([place])  12/08/2019 Initial Diagnosis   Breast cancer ([place])   01/01/2020 Cancer Staging   Staging form: Breast, AJCC 8th Edition - Clinical stage from 01/01/2020: Stage IA (cT1c, cN0(sn), cM0, G3, ER-, PR-, HER2+) - Signed by Derwood Kaplan, MD on 01/09/2020   01/29/2020 -  Chemotherapy   Patient is on Treatment Plan : BREAST  Docetaxel + Carboplatin + Trastuzumab + Pertuzumab  (TCHP) q21d / Trastuzumab + Pertuzumab q21d       REVIEW OF SYSTEMS:   Constitutional: Denies fevers, chills or abnormal weight loss Eyes: Denies blurriness of vision Ears, nose, mouth, throat, and face: Denies mucositis or sore throat Respiratory: Denies cough, dyspnea or wheezes Cardiovascular: Denies palpitation, chest  discomfort Gastrointestinal:  Denies nausea, heartburn or change in bowel habits Skin: Denies abnormal skin rashes Lymphatics: Denies new lymphadenopathy or easy bruising Neurological:Denies numbness, tingling or new weaknesses Behavioral/Psych: Mood is stable, no new changes  Extremities: No lower extremity edema All other systems were reviewed with the patient and are negative.  I have reviewed the past medical history, past surgical history, social history and family history with the patient and they are unchanged from previous note.  ALLERGIES:  is allergic to metformin and related.  MEDICATIONS:  Current Outpatient Medications  Medication Sig Dispense Refill   amLODipine (NORVASC) 5 MG tablet Take 1 tablet (5 mg total) by mouth daily. 30 tablet 0   Blood Glucose Monitoring Suppl (TRUE METRIX METER) DEVI dx 11.9 (Medicaid preferred) USE FOR TESTING BLOOD SUGARS when Fasting daily     COVID-19 mRNA vaccine, Moderna, 100 MCG/0.5ML injection USE AS DIRECTED .25 mL 0   gabapentin (NEURONTIN) 300 MG capsule Take 1 capsule (300 mg total) by mouth 3 (three) times daily. 90 capsule 5   GNP ULTICARE PEN NEEDLES 32G X 4 MM MISC      hydrochlorothiazide (HYDRODIURIL) 25 MG tablet TAKE 1 TABLET (25 MG TOTAL) BY MOUTH DAILY. 30 tablet 2   insulin glargine (LANTUS) 100 UNIT/ML Solostar Pen Inject into the skin. 10 units in the morning and 40 units at bedtime     JANUVIA 100 MG tablet Take 100 mg by mouth daily.     KLOR-CON M20 20 MEQ tablet TAKE 1 TABLET BY MOUTH TWICE A DAY 180 tablet 0   losartan (COZAAR) 50 MG tablet TAKE 1 TABLET BY MOUTH EVERY DAY 30 tablet 2  ondansetron (ZOFRAN) 4 MG tablet Take 1 tablet (4 mg total) by mouth every 4 (four) hours as needed for nausea. 90 tablet 3   oxyCODONE 10 MG TABS Take 1-1.5 tablets (10-15 mg total) by mouth every 4 (four) hours as needed for severe pain. 120 tablet 0   prochlorperazine (COMPAZINE) 10 MG tablet Take 1 tablet (10 mg total) by mouth  every 6 (six) hours as needed for nausea or vomiting. 90 tablet 3   TRUE METRIX BLOOD GLUCOSE TEST test strip SMARTSIG:1 Via Meter Daily PRN     TRUEplus Lancets 28G MISC SMARTSIG:1 Topical Daily PRN     No current facility-administered medications for this visit.    PHYSICAL EXAMINATION: ECOG PERFORMANCE STATUS: {CHL ONC ECOG [id number]  LABORATORY DATA:  I have reviewed the data as listed CMP Latest Ref Rng & Units 11/19/2020 11/03/2020 10/08/2020  BUN 4 - 21 23(A) 19 20  Creatinine 0.5 - 1.1 1.1 1.1 1.0  Sodium 137 - 147 142 140 140  Potassium 3.4 - 5.3 4.0 3.7 3.6  Chloride 99 - 108 111(A) - 108  CO2 13 - 22 23(A) 107(A) 23(A)  Calcium 8.7 - 10.7 9.7 9.6 9.5  Alkaline Phos 25 - 125 127(A) 111 100  AST 13 - 35 '20 20 21  ' ALT 7 - 35 '13 12 13    ' Lab Results  Component Value Date   WBC 7.4 11/19/2020   HGB 12.5 11/19/2020   HCT 37 11/19/2020   MCV 85 11/19/2020   PLT 221 11/19/2020   NEUTROABS 5.62 11/19/2020     RADIOGRAPHIC STUDIES: I have personally reviewed the radiological images as listed and agreed with the findings in the report. No results found.  I discussed the assessment and treatment plan with the patient. The patient was provided an opportunity to ask questions and all were answered. The patient agreed with the plan and demonstrated an understanding of the instructions. The patient was advised to call back or seek an in-person evaluation if the symptoms worsen or if the condition fails to improve as anticipated.    I spent {CHL ONC TIME VISIT - IOXBD:[id number]} for the appointment reviewing test results, discuss management and coordination of care.  Derwood Kaplan, MD 11/19/2020 10:35 AM

## 2020-11-23 ENCOUNTER — Other Ambulatory Visit: Payer: Self-pay

## 2020-11-23 DIAGNOSIS — G620 Drug-induced polyneuropathy: Secondary | ICD-10-CM

## 2020-11-23 DIAGNOSIS — T451X5A Adverse effect of antineoplastic and immunosuppressive drugs, initial encounter: Secondary | ICD-10-CM

## 2020-11-23 MED ORDER — OXYCODONE HCL 10 MG PO TABS: 10.0000 mg | ORAL_TABLET | ORAL | 0 refills | Status: DC | PRN

## 2020-11-24 ENCOUNTER — Other Ambulatory Visit: Payer: Self-pay | Admitting: Hematology and Oncology

## 2020-11-24 DIAGNOSIS — I10 Essential (primary) hypertension: Secondary | ICD-10-CM

## 2020-11-25 ENCOUNTER — Encounter: Payer: Self-pay | Admitting: Oncology

## 2020-11-25 ENCOUNTER — Encounter: Payer: Self-pay | Admitting: Hematology and Oncology

## 2020-11-26 ENCOUNTER — Inpatient Hospital Stay: Payer: Medicaid Other

## 2020-11-26 ENCOUNTER — Other Ambulatory Visit: Payer: Self-pay

## 2020-11-26 VITALS — BP 142/79 | HR 88 | Temp 97.8°F | Resp 18 | Ht 69.0 in | Wt 174.5 lb

## 2020-11-26 DIAGNOSIS — C50512 Malignant neoplasm of lower-outer quadrant of left female breast: Secondary | ICD-10-CM

## 2020-11-26 DIAGNOSIS — Z171 Estrogen receptor negative status [ER-]: Secondary | ICD-10-CM

## 2020-11-26 DIAGNOSIS — Z5112 Encounter for antineoplastic immunotherapy: Secondary | ICD-10-CM | POA: Diagnosis not present

## 2020-11-26 MED ORDER — HEPARIN SOD (PORK) LOCK FLUSH 100 UNIT/ML IV SOLN
500.0000 [IU] | Freq: Once | INTRAVENOUS | Status: AC | PRN
  Administered 2020-11-26: 500 [IU]

## 2020-11-26 MED ORDER — ACETAMINOPHEN 325 MG PO TABS: 650.0000 mg | ORAL_TABLET | Freq: Once | ORAL | Status: DC

## 2020-11-26 MED ORDER — TRASTUZUMAB-ANNS CHEMO 150 MG IV SOLR
6.0000 mg/kg | Freq: Once | INTRAVENOUS | Status: AC
  Administered 2020-11-26: 462 mg via INTRAVENOUS
  Filled 2020-11-26: qty 22

## 2020-11-26 MED ORDER — DIPHENHYDRAMINE HCL 50 MG/ML IJ SOLN: 25.0000 mg | Freq: Once | INTRAMUSCULAR | Status: DC

## 2020-11-26 MED ORDER — SODIUM CHLORIDE 0.9 % IV SOLN: Freq: Once | INTRAVENOUS | Status: AC

## 2020-11-26 MED ORDER — SODIUM CHLORIDE 0.9 % IV SOLN
420.0000 mg | Freq: Once | INTRAVENOUS | Status: AC
  Administered 2020-11-26: 420 mg via INTRAVENOUS
  Filled 2020-11-26: qty 14

## 2020-11-26 MED ORDER — SODIUM CHLORIDE 0.9% FLUSH
10.0000 mL | INTRAVENOUS | Status: DC | PRN
  Administered 2020-11-26: 10 mL

## 2020-11-26 NOTE — Progress Notes (Signed)
1117: PT STABLE AT TIME OF DISCHARGE  

## 2020-11-26 NOTE — Patient Instructions (Signed)
[facility]  Discharge Instructions: Thank you for choosing [facility] to provide your oncology and hematology care.  If you have a lab appointment with the [facility], please go directly to the [facility] and check in at the registration area.   Wear comfortable clothing and clothing appropriate for easy access to any Portacath or PICC line.   We strive to give you quality time with your provider. You may need to reschedule your appointment if you arrive late (15 or more minutes).  Arriving late affects you and other patients whose appointments are after yours.  Also, if you miss three or more appointments without notifying the office, you may be dismissed from the clinic at the provider's discretion.      For prescription refill requests, have your pharmacy contact our office and allow 72 hours for refills to be completed.    Today you received the following chemotherapy and/or immunotherapy agents Trastuzumab,Pertuzumab     To help prevent nausea and vomiting after your treatment, we encourage you to take your nausea medication as directed.  BELOW ARE SYMPTOMS THAT SHOULD BE REPORTED IMMEDIATELY: *FEVER GREATER THAN 100.4 F (38 C) OR HIGHER *CHILLS OR SWEATING *NAUSEA AND VOMITING THAT IS NOT CONTROLLED WITH YOUR NAUSEA MEDICATION *UNUSUAL SHORTNESS OF BREATH *UNUSUAL BRUISING OR BLEEDING *URINARY PROBLEMS (pain or burning when urinating, or frequent urination) *BOWEL PROBLEMS (unusual diarrhea, constipation, pain near the anus) TENDERNESS IN MOUTH AND THROAT WITH OR WITHOUT PRESENCE OF ULCERS (sore throat, sores in mouth, or a toothache) UNUSUAL RASH, SWELLING OR PAIN  UNUSUAL VAGINAL DISCHARGE OR ITCHING   Items with * indicate a potential emergency and should be followed up as soon as possible or go to the Emergency Department if any problems should occur.  Please show the CHEMOTHERAPY ALERT CARD or IMMUNOTHERAPY ALERT CARD at check-in  to the Emergency Department and triage nurse.  Should you have questions after your visit or need to cancel or reschedule your appointment, please contact [facility]  Dept: [phone]  and follow the prompts.  Office hours are 8:00 a.m. to 4:30 p.m. Monday - Friday. Please note that voicemails left after 4:00 p.m. may not be returned until the following business day.  We are closed weekends and major holidays. You have access to a nurse at all times for urgent questions. Please call the main number to the clinic Dept: [phone] and follow the prompts.  For any non-urgent questions, you may also contact your provider using MyChart. We now offer e-Visits for anyone 59 and older to request care online for non-urgent symptoms. For details visit [email or web address].   Also download the MyChart app! Go to the app store, search "MyChart", open the app, select [facility], and log in with your MyChart username and password.  Due to Covid, a mask is required upon entering the hospital/clinic. If you do not have a mask, one will be given to you upon arrival. For doctor visits, patients may have 1 support person aged 14 or older with them. For treatment visits, patients cannot have anyone with them due to current Covid guidelines and our immunocompromised population.

## 2020-11-28 ENCOUNTER — Encounter: Payer: Self-pay | Admitting: Hematology and Oncology

## 2020-11-28 ENCOUNTER — Encounter: Payer: Self-pay | Admitting: Oncology

## 2020-12-11 ENCOUNTER — Other Ambulatory Visit: Payer: PRIVATE HEALTH INSURANCE

## 2020-12-11 ENCOUNTER — Other Ambulatory Visit: Payer: Self-pay | Admitting: Hematology and Oncology

## 2020-12-11 ENCOUNTER — Ambulatory Visit: Payer: PRIVATE HEALTH INSURANCE | Admitting: Hematology and Oncology

## 2020-12-11 DIAGNOSIS — R600 Localized edema: Secondary | ICD-10-CM

## 2020-12-16 ENCOUNTER — Ambulatory Visit: Payer: PRIVATE HEALTH INSURANCE

## 2020-12-16 ENCOUNTER — Encounter: Payer: Self-pay | Admitting: Hematology and Oncology

## 2020-12-16 ENCOUNTER — Inpatient Hospital Stay: Payer: Medicaid Other | Attending: Hematology and Oncology

## 2020-12-16 ENCOUNTER — Other Ambulatory Visit: Payer: Self-pay

## 2020-12-16 ENCOUNTER — Telehealth: Payer: Self-pay | Admitting: Hematology and Oncology

## 2020-12-16 ENCOUNTER — Inpatient Hospital Stay (INDEPENDENT_AMBULATORY_CARE_PROVIDER_SITE_OTHER): Payer: Medicaid Other | Admitting: Hematology and Oncology

## 2020-12-16 DIAGNOSIS — G620 Drug-induced polyneuropathy: Secondary | ICD-10-CM

## 2020-12-16 DIAGNOSIS — Z5112 Encounter for antineoplastic immunotherapy: Secondary | ICD-10-CM | POA: Insufficient documentation

## 2020-12-16 DIAGNOSIS — Z923 Personal history of irradiation: Secondary | ICD-10-CM | POA: Insufficient documentation

## 2020-12-16 DIAGNOSIS — Z170 Estrogen receptor positive status [ER+]: Secondary | ICD-10-CM | POA: Insufficient documentation

## 2020-12-16 DIAGNOSIS — T451X5A Adverse effect of antineoplastic and immunosuppressive drugs, initial encounter: Secondary | ICD-10-CM

## 2020-12-16 DIAGNOSIS — Z171 Estrogen receptor negative status [ER-]: Secondary | ICD-10-CM

## 2020-12-16 DIAGNOSIS — Z79899 Other long term (current) drug therapy: Secondary | ICD-10-CM | POA: Insufficient documentation

## 2020-12-16 DIAGNOSIS — C50512 Malignant neoplasm of lower-outer quadrant of left female breast: Secondary | ICD-10-CM

## 2020-12-16 DIAGNOSIS — D0512 Intraductal carcinoma in situ of left breast: Secondary | ICD-10-CM | POA: Insufficient documentation

## 2020-12-16 DIAGNOSIS — Z9221 Personal history of antineoplastic chemotherapy: Secondary | ICD-10-CM | POA: Insufficient documentation

## 2020-12-16 LAB — BASIC METABOLIC PANEL
BUN: 16 (ref 4–21)
CO2: 24 — AB (ref 13–22)
Chloride: 110 — AB (ref 99–108)
Creatinine: 1 (ref 0.5–1.1)
Glucose: 165
Potassium: 3.9 (ref 3.4–5.3)
Sodium: 140 (ref 137–147)

## 2020-12-16 LAB — HEPATIC FUNCTION PANEL
ALT: 16 (ref 7–35)
AST: 21 (ref 13–35)
Alkaline Phosphatase: 106 (ref 25–125)
Bilirubin, Total: 0.4

## 2020-12-16 LAB — COMPREHENSIVE METABOLIC PANEL
Albumin: 4 (ref 3.5–5.0)
Calcium: 9.5 (ref 8.7–10.7)

## 2020-12-16 LAB — CBC AND DIFFERENTIAL
HCT: 37 (ref 36–46)
Hemoglobin: 12.7 (ref 12.0–16.0)
Neutrophils Absolute: 6.97
Platelets: 206 (ref 150–399)
WBC: 8.4

## 2020-12-16 LAB — CBC: RBC: 4.29 (ref 3.87–5.11)

## 2020-12-16 NOTE — Telephone Encounter (Signed)
Per 11/16 los next appt scheduled and given to patient 

## 2020-12-16 NOTE — Assessment & Plan Note (Signed)
This is improving with gabapentin and patient using lidocaine spray on her toes. She will continue with gabapentin three times daily.

## 2020-12-16 NOTE — Progress Notes (Signed)
Patient Care Team: Carolynne Edouard, MD as PCP - General (Family Medicine) Derwood Kaplan, MD as Consulting Physician (Oncology) Pollyann Samples, MD as Consulting Physician (General Surgery) Gatha Mayer, MD as Consulting Physician (Radiation Oncology)  Clinic Day:  12/16/2020  Referring physician: Carolynne Edouard, MD  ASSESSMENT & PLAN:   Assessment & Plan: Breast cancer [facility]) She is doing well on maintenance trastuzumab and pertuzumab. She will proceed with cycle 16 this week. Most recent Echo was normal. She will return to clinic in 3 weeks for repeat evaluation.   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 She will continue with gabapentin three times daily.   The patient understands the plans discussed today and is in agreement with them.  She knows to contact our office if she develops concerns prior to her next appointment.    Brittney Ped, NP  [facility] [address] [place] [address] Dept: [phone] Dept Fax: [phone]   No orders of the defined types were placed in this encounter.     CHIEF COMPLAINT:  CC: A 60 year old female with history of breast cancer here for 3 week evaluation  Current Treatment:  Trastuzumab/ Pertuzumab  INTERVAL HISTORY:  Brittney Burgess is here today for repeat clinical assessment. She denies fevers or chills. She denies pain. Her appetite is good. Her weight has increased 3 pounds over last 3 weeks .  I have reviewed the past medical history, past surgical history, social history and family history with the patient and they are unchanged from previous note.  ALLERGIES:  is allergic to metformin and related.  MEDICATIONS:  Current Outpatient Medications  Medication Sig Dispense Refill   amLODipine (NORVASC) 5 MG tablet Take 1 tablet (5 mg total) by mouth daily. 30 tablet 0    Blood Glucose Monitoring Suppl (TRUE METRIX METER) DEVI dx 11.9 (Medicaid preferred) USE FOR TESTING BLOOD SUGARS when Fasting daily     COVID-19 mRNA vaccine, Moderna, 100 MCG/0.5ML injection USE AS DIRECTED .25 mL 0   gabapentin (NEURONTIN) 300 MG capsule Take 1 capsule (300 mg total) by mouth 3 (three) times daily. 90 capsule 5   GNP ULTICARE PEN NEEDLES 32G X 4 MM MISC      hydrochlorothiazide (HYDRODIURIL) 25 MG tablet TAKE 1 TABLET (25 MG TOTAL) BY MOUTH DAILY. 90 tablet 0   insulin glargine (LANTUS) 100 UNIT/ML Solostar Pen Inject into the skin. 10 units in the morning and 40 units at bedtime     JANUVIA 100 MG tablet Take 100 mg by mouth daily.     KLOR-CON M20 20 MEQ tablet TAKE 1 TABLET BY MOUTH TWICE A DAY 180 tablet 0   losartan (COZAAR) 50 MG tablet TAKE 1 TABLET BY MOUTH EVERY DAY 30 tablet 2   ondansetron (ZOFRAN) 4 MG tablet Take 1 tablet (4 mg total) by mouth every 4 (four) hours as needed for nausea. 90 tablet 3   Oxycodone HCl 10 MG TABS Take 1-1.5 tablets (10-15 mg total) by mouth every 4 (four) hours as needed. 120 tablet 0   prochlorperazine (COMPAZINE) 10 MG tablet Take 1 tablet (10 mg total) by mouth every 6 (six) hours as needed for nausea or vomiting. 90 tablet 3   TRUE METRIX BLOOD GLUCOSE TEST test strip SMARTSIG:1 Via Meter Daily PRN     TRUEplus Lancets 28G MISC SMARTSIG:1 Topical Daily PRN  No current facility-administered medications for this visit.    HISTORY OF PRESENT ILLNESS:   Oncology History  Breast cancer ([place])  09/09/2019 Mammogram   SCREENING BILATERAL MAMMOGRAM: Further evaluation is suggested for possible mass with calcifications and possible dilated ducts in the left breast.   10/01/2019 Mammogram   DIAGNOSTIC UNILATERAL LEFT MAMMOGRAM AND LEFT BREAST ULTRASOUND: A 1.6 cm elongated mass containing pleomorphic calcifications in the 7 o'clock position of the left breast, highly suspicious for breast malignancy.  Tissue sampling is  recommended.  A 5 mm hypoechoic, indeterminate mass containing 2 benign-appearing calcificaitons in the 2 o'clock position of the left breast.  Several retroareolar ecstatic ducts containing internal debris or possibly tissue.   10/21/2019 Pathology Results   1.  Breast, left, needle core biopsy, 5 o'clock, 5 cmfn:  -  Invasive ductal carcinoma, grade 3, and high grade ductal carcinoma in situ with necrosis.  -  Tumor involves multiple cores and measures 5 mm in maximum extent in a single core 2.  Breast, left, needle core biopsy, 2 o'clock:  -  Fibrocystic changes, including sclerosing adenosis with calcifications  -  No atypia, in situ or invasive malignancy identified 3.  Breast, left, needle core biopsy, 11:30 o'clock:  -  Fragmented intraductal papilloma and dilated duct  -  No atypia, in situ or invasive malignancy identified  HER2: Positive  Ration of HER2/CEN17 signals: 2.23  Average HER2 copy #/cell: 4.23 ER: Negative 0% PR: Negative 0% Ki67: 50%   11/13/2019 Breast MRI   BILATERAL BREAST MRI WITH AND WITHOUT CONTRAST: 1.  Question abnormal lymph node in the left axilla.   2, 1.5 cm enhancement in the left breast approximately 6 o'clock position with associated biopsy clip consistent with recent biopsy cancer.  3. 0.6 cm masslike enhancement in the retroareolar left breast with associated biopsy clip artifact correlating to recently biopsy proven papilloma.   12/08/2019 Initial Diagnosis   Breast cancer ([place])   12/23/2019 Pathology Results   1.  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  Lymph node, sentinel, biopsy, left:             -  One benign, reactive lymph node with focal biopsy site reaction  (0/1)  Tumor size: 1.2 x 1.0 x 0.8 cm Histologic grade: 3   01/01/2020 Cancer Staging   Staging form: Breast, AJCC 8th Edition - Clinical stage from 01/01/2020: Stage IA (cT1c, cN0(sn), cM0, G3, ER-, PR-, HER2+) - Signed by Derwood Kaplan, MD on 01/09/2020    01/29/2020 -  Chemotherapy   Patient is on Treatment Plan : BREAST  Docetaxel + Carboplatin + Trastuzumab + Pertuzumab  (TCHP) q21d (COMPLETED TCHP ON 05/20/2020) / Trastuzumab + Pertuzumab q21d      06/15/2020 - 07/21/2020 Radiation Therapy   5000 cGy in 25 fractions    10/14/2020 Mammogram   DIAGNOSTIC BILATERAL MAMMOGRAM: No evidence of malignancy in either breast.  10/29/2020 Echocardiogram   Overall left ventricular systolic function is normal with, an EF between 55 - 60 %.        REVIEW OF SYSTEMS:   Constitutional: Denies fevers, chills or abnormal weight loss Eyes: Denies blurriness of vision Ears, nose, mouth, throat, and face: Denies mucositis or sore throat Respiratory: Denies cough, dyspnea or wheezes Cardiovascular: Denies palpitation, chest discomfort or lower extremity swelling Gastrointestinal:  Denies nausea, heartburn or change in bowel habits Skin: Denies abnormal skin rashes Lymphatics: Denies new lymphadenopathy or easy bruising Neurological:Denies numbness, tingling or new weaknesses Behavioral/Psych: Mood is stable, no new changes  All other systems were reviewed with the patient and are negative.   VITALS:  Blood pressure (!) 159/77, pulse 94, temperature 97.7 F (36.5 C), temperature source Oral, resp. rate 20, height _0  (1.753 m), weight 177 lb 14.4 oz (80.7 kg), SpO2 94 %.  Wt Readings from Last 3 Encounters:  12/16/20 177 lb 14.4 oz (80.7 kg)  11/26/20 174 lb 8 oz (79.2 kg)  11/19/20 171 lb 14.4 oz (78 kg)    Body mass index is 26.27 kg/m.  Performance status (ECOG): 1 - Symptomatic but completely ambulatory  PHYSICAL EXAM:   GENERAL:alert, no distress and comfortable SKIN:  skin color, texture, turgor are normal, no rashes or significant lesions EYES: normal, Conjunctiva are pink and non-injected, sclera clear OROPHARYNX:no exudate, no erythema and lips, buccal mucosa, and tongue normal  NECK: supple, thyroid normal size, non-tender, without nodularity LYMPH:  no palpable lymphadenopathy in the cervical, axillary or inguinal LUNGS: clear to auscultation and percussion with normal breathing effort HEART: regular rate & rhythm and no murmurs and no lower extremity edema ABDOMEN:abdomen soft, non-tender and normal bowel sounds Musculoskeletal:no cyanosis of digits and no clubbing  NEURO: alert & oriented x 3 with fluent speech, no focal motor/sensory deficits  LABORATORY DATA:  I have reviewed the data as listed    Component Value Date/Time   NA 140 12/16/2020 0000   K 3.9 12/16/2020 0000   CL 110 (A) 12/16/2020 0000   CO2 24 (A) 12/16/2020 0000   BUN 16 12/16/2020 0000   CREATININE 1.0 12/16/2020 0000   CALCIUM 9.5 12/16/2020 0000   ALBUMIN 4.0 12/16/2020 0000   AST 21 12/16/2020 0000   ALT 16 12/16/2020 0000   ALKPHOS 106 12/16/2020 0000    No results found for: SPEP, UPEP  Lab Results  Component Value Date   WBC 8.4 12/16/2020   NEUTROABS 6.97 12/16/2020   HGB 12.7 12/16/2020   HCT 37 12/16/2020   MCV 85 11/19/2020   PLT 206 12/16/2020      Chemistry      Component Value Date/Time   NA 140 12/16/2020 0000   K 3.9 12/16/2020 0000   CL 110 (A) 12/16/2020 0000   CO2 24 (A) 12/16/2020 0000   BUN 16 12/16/2020 0000   CREATININE 1.0 12/16/2020 0000   GLU 165 12/16/2020 0000      Component Value Date/Time   CALCIUM 9.5 12/16/2020 0000   ALKPHOS 106 12/16/2020 0000   AST 21 12/16/2020 0000   ALT 16 12/16/2020 0000       RADIOGRAPHIC STUDIES: I have personally reviewed the radiological images as listed and agreed with the findings in the report. No results found.

## 2020-12-16 NOTE — Assessment & Plan Note (Signed)
She is doing well on maintenance trastuzumab and pertuzumab. She will proceed with cycle 16 this week. Most recent Echo was normal. She will return to clinic in 3 weeks for repeat evaluation.

## 2020-12-17 ENCOUNTER — Inpatient Hospital Stay: Payer: Medicaid Other

## 2020-12-17 VITALS — BP 161/70 | HR 84 | Temp 98.0°F | Resp 18 | Ht 69.0 in | Wt 177.2 lb

## 2020-12-17 DIAGNOSIS — D0512 Intraductal carcinoma in situ of left breast: Secondary | ICD-10-CM | POA: Diagnosis present

## 2020-12-17 DIAGNOSIS — Z9221 Personal history of antineoplastic chemotherapy: Secondary | ICD-10-CM | POA: Diagnosis not present

## 2020-12-17 DIAGNOSIS — Z5112 Encounter for antineoplastic immunotherapy: Secondary | ICD-10-CM | POA: Diagnosis not present

## 2020-12-17 DIAGNOSIS — Z923 Personal history of irradiation: Secondary | ICD-10-CM | POA: Diagnosis not present

## 2020-12-17 DIAGNOSIS — Z79899 Other long term (current) drug therapy: Secondary | ICD-10-CM | POA: Diagnosis not present

## 2020-12-17 DIAGNOSIS — C50512 Malignant neoplasm of lower-outer quadrant of left female breast: Secondary | ICD-10-CM

## 2020-12-17 DIAGNOSIS — Z170 Estrogen receptor positive status [ER+]: Secondary | ICD-10-CM | POA: Diagnosis not present

## 2020-12-17 MED ORDER — TRASTUZUMAB-ANNS CHEMO 150 MG IV SOLR
6.0000 mg/kg | Freq: Once | INTRAVENOUS | Status: AC
  Administered 2020-12-17: 462 mg via INTRAVENOUS
  Filled 2020-12-17: qty 22

## 2020-12-17 MED ORDER — SODIUM CHLORIDE 0.9 % IV SOLN
Freq: Once | INTRAVENOUS | Status: AC
Start: 2020-12-17 — End: 2020-12-17

## 2020-12-17 MED ORDER — SODIUM CHLORIDE 0.9% FLUSH
10.0000 mL | INTRAVENOUS | Status: DC | PRN
  Administered 2020-12-17: 10 mL

## 2020-12-17 MED ORDER — DIPHENHYDRAMINE HCL 50 MG/ML IJ SOLN: 25.0000 mg | Freq: Once | INTRAMUSCULAR | Status: DC

## 2020-12-17 MED ORDER — ACETAMINOPHEN 325 MG PO TABS: 650.0000 mg | ORAL_TABLET | Freq: Once | ORAL | Status: DC

## 2020-12-17 MED ORDER — SODIUM CHLORIDE 0.9 % IV SOLN
420.0000 mg | Freq: Once | INTRAVENOUS | Status: AC
  Administered 2020-12-17: 420 mg via INTRAVENOUS
  Filled 2020-12-17: qty 14

## 2020-12-17 MED ORDER — HEPARIN SOD (PORK) LOCK FLUSH 100 UNIT/ML IV SOLN
500.0000 [IU] | Freq: Once | INTRAVENOUS | Status: AC | PRN
  Administered 2020-12-17: 500 [IU]

## 2020-12-17 NOTE — Patient Instructions (Signed)
[facility]  Discharge Instructions: Thank you for choosing [facility] to provide your oncology and hematology care.  If you have a lab appointment with the [facility], please go directly to the [facility] and check in at the registration area.   Wear comfortable clothing and clothing appropriate for easy access to any Portacath or PICC line.   We strive to give you quality time with your provider. You may need to reschedule your appointment if you arrive late (15 or more minutes).  Arriving late affects you and other patients whose appointments are after yours.  Also, if you miss three or more appointments without notifying the office, you may be dismissed from the clinic at the provider's discretion.      For prescription refill requests, have your pharmacy contact our office and allow 72 hours for refills to be completed.    Today you received the following chemotherapy and/or immunotherapy agents Kanjinti/Pertuzumab   To help prevent nausea and vomiting after your treatment, we encourage you to take your nausea medication as directed.  BELOW ARE SYMPTOMS THAT SHOULD BE REPORTED IMMEDIATELY: *FEVER GREATER THAN 100.4 F (38 C) OR HIGHER *CHILLS OR SWEATING *NAUSEA AND VOMITING THAT IS NOT CONTROLLED WITH YOUR NAUSEA MEDICATION *UNUSUAL SHORTNESS OF BREATH *UNUSUAL BRUISING OR BLEEDING *URINARY PROBLEMS (pain or burning when urinating, or frequent urination) *BOWEL PROBLEMS (unusual diarrhea, constipation, pain near the anus) TENDERNESS IN MOUTH AND THROAT WITH OR WITHOUT PRESENCE OF ULCERS (sore throat, sores in mouth, or a toothache) UNUSUAL RASH, SWELLING OR PAIN  UNUSUAL VAGINAL DISCHARGE OR ITCHING   Items with * indicate a potential emergency and should be followed up as soon as possible or go to the Emergency Department if any problems should occur.  Please show the CHEMOTHERAPY ALERT CARD or IMMUNOTHERAPY ALERT CARD at check-in to  the Emergency Department and triage nurse.  Should you have questions after your visit or need to cancel or reschedule your appointment, please contact [facility]  Dept: [phone]  and follow the prompts.  Office hours are 8:00 a.m. to 4:30 p.m. Monday - Friday. Please note that voicemails left after 4:00 p.m. may not be returned until the following business day.  We are closed weekends and major holidays. You have access to a nurse at all times for urgent questions. Please call the main number to the clinic Dept: [phone] and follow the prompts.  For any non-urgent questions, you may also contact your provider using MyChart. We now offer e-Visits for anyone 38 and older to request care online for non-urgent symptoms. For details visit [email or web address].   Also download the MyChart app! Go to the app store, search "MyChart", open the app, select , and log in with your MyChart username and password.  Due to Covid, a mask is required upon entering the hospital/clinic. If you do not have a mask, one will be given to you upon arrival. For doctor visits, patients may have 1 support person aged 59 or older with them. For treatment visits, patients cannot have anyone with them due to current Covid guidelines and our immunocompromised population.

## 2020-12-30 NOTE — Progress Notes (Signed)
[facility]  [address] [place],  [place]  [address] [phone]  Clinic Day:  01/06/2021  Referring physician: Carolynne Edouard, MD  This document serves as a record of services personally performed by Brittney Poisson, MD. It was created on their behalf by Curry,Lauren E, a trained medical scribe. The creation of this record is based on the scribe's personal observations and the provider's statements to them.  ASSESSMENT & PLAN:   Assessment & Plan: 1. 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 She completed chemotherapy at the end of April 2022 and radiation therapy in June.  We will continue with maintenance tastuzumab/pertuzumab every 3 weeks for a total of 1 year of HER2 targeted therapy. Most recent ECHO from September was normal with an EF between 55-60%. She will proceed with her 17th and final cycle this week.   2.  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 She has persistent numbness, which has recently worsened. I will slowly increase her gabapentin 300 mg to 6 daily, initially starting with four daily for 1 week.  I will also refill her oxycodone.   Plan: She will proceed with a 17th and final cycle of maintenance trastuzumab/pertuzumab tomorrow. In view of her severe neuropathy, I will slowly increase her gabapentin, starting at four daily for 1 week, with instructions to then increase to 3 pills TID. I will also refill her oxycodone 10 mg for breakthrough pain. We will see her back in 1-2 months with CBC and CMP for repeat examination. She may have her port removed at any time after this final cycle, and we will plan to contact Dr. Lilia Pro when she is ready. The patient understands the plans discussed today and is in agreement with them.  She knows to contact our office if she develops concerns prior to her next  appointment.  I provided 20 minutes of face-to-face time during this this encounter and > 50% was spent counseling as documented under my assessment and plan.    [name] [address] [place] [address] Dept: [phone] Dept Fax: [phone]   No orders of the defined types were placed in this encounter.     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  Current Treatment:  Herceptin/ Perjeta IV every 3 weeks   HISTORY OF PRESENT ILLNESS:   Oncology History  Breast cancer ([facility])  09/09/2019 Mammogram   SCREENING BILATERAL MAMMOGRAM: Further evaluation is suggested for possible mass with calcifications and possible dilated ducts in the left breast.   10/01/2019 Mammogram   DIAGNOSTIC UNILATERAL LEFT MAMMOGRAM AND LEFT BREAST ULTRASOUND: A 1.6 cm elongated mass containing pleomorphic calcifications in the 7 o'clock position of the left breast, highly suspicious for breast malignancy.  Tissue sampling is recommended.  A 5 mm hypoechoic, indeterminate mass containing 2 benign-appearing calcificaitons in the 2 o'clock position of the left breast.  Several retroareolar ecstatic ducts containing internal debris or possibly tissue.   10/21/2019 Pathology Results   1.  Breast, left, needle core biopsy, 5 o'clock, 5 cmfn:  -  Invasive ductal carcinoma, grade 3, and high grade ductal carcinoma in situ with necrosis.  -  Tumor involves multiple cores and measures 5 mm in maximum extent in a single core 2.  Breast,  left, needle core biopsy, 2 o'clock:  -  Fibrocystic changes, including sclerosing adenosis with calcifications  -  No atypia, in situ or invasive malignancy identified 3.  Breast, left, needle core biopsy, 11:30 o'clock:  -  Fragmented intraductal papilloma and dilated duct  -  No atypia, in situ or invasive malignancy  identified  HER2: Positive  Ration of HER2/CEN17 signals: 2.23  Average HER2 copy #/cell: 4.23 ER: Negative 0% PR: Negative 0% Ki67: 50%   11/13/2019 Breast MRI   BILATERAL BREAST MRI WITH AND WITHOUT CONTRAST: 1.  Question abnormal lymph node in the left axilla.   2, 1.5 cm enhancement in the left breast approximately 6 o'clock position with associated biopsy clip consistent with recent biopsy cancer.  3. 0.6 cm masslike enhancement in the retroareolar left breast with associated biopsy clip artifact correlating to recently biopsy proven papilloma.   12/08/2019 Initial Diagnosis   Breast cancer ([facility])   12/23/2019 Pathology Results   1.  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  Lymph node, sentinel, biopsy, left:             -  One benign, reactive lymph node with focal biopsy site reaction (0/1)  Tumor size: 1.2 x 1.0 x 0.8 cm Histologic grade: 3   01/01/2020 Cancer Staging   Staging form: Breast, AJCC 8th Edition - Clinical stage from 01/01/2020: Stage IA (cT1c, cN0(sn), cM0, G3, ER-, PR-, HER2+) - Signed by Derwood Kaplan, MD on 01/09/2020    01/29/2020 -  Chemotherapy   Patient is on Treatment Plan : BREAST  Docetaxel + Carboplatin + Trastuzumab + Pertuzumab  (TCHP) q21d (COMPLETED TCHP ON 05/20/2020) / Trastuzumab + Pertuzumab q21d      06/15/2020 - 07/21/2020 Radiation Therapy   5000 cGy in 25 fractions    10/14/2020 Mammogram   DIAGNOSTIC BILATERAL MAMMOGRAM: No evidence of malignancy in either breast.    10/29/2020 Echocardiogram   Overall left ventricular systolic function is normal with, an EF between 55 - 60 %.       INTERVAL HISTORY:  Brittney Burgess is here for routine follow up prior to a 17th and final  cycle of trastuzumab/pertuzumab. She reports significant and severe neuropathy, and came into the clinic in a wheelchair today. She has had relief with oxycodone 10 mg, averaging 2-3 daily, but ran out recently. I will send in a refill today. She also continues gabapentin 300 mg TID, and so I will also increase this to 4 pills daily, with instructions to increase her dose to 6 pills daily after 1 week. She has been using icy hot and Vic's vapor rub in the meantime. She is tearful and emotional due to her pain. I explained that she should be safe to pursue dental work in April, and filled out the appropriate paperwork for this today. Blood counts and chemistries are unremarkable except for a BUN of 23 and a creatinine of 1.3,  increased from 1.0. I advised that she push fluids. Her  appetite is good, and she has lost nearly 3 pounds since her last visit.  She denies fever, chills or other signs of infection.  She denies nausea, vomiting, bowel issues, or abdominal pain.  She denies sore throat, cough, dyspnea, or chest pain.  REVIEW OF SYSTEMS:  Review of Systems  Constitutional: Negative.  Negative for appetite change, chills, fatigue, fever and unexpected weight change.  HENT:  Negative.    Eyes: Negative.   Respiratory: Negative.  Negative for chest tightness, cough, hemoptysis, shortness of breath and wheezing.   Cardiovascular: Negative.  Negative for chest pain, leg swelling and palpitations.  Gastrointestinal: Negative.  Negative for abdominal distention, abdominal pain, blood in stool, constipation, diarrhea, nausea and vomiting.  Endocrine: Negative.   Genitourinary: Negative.  Negative for difficulty urinating, dysuria, frequency and hematuria.   Musculoskeletal: Negative.  Negative for arthralgias, back pain, flank pain, gait problem and myalgias.  Skin: Negative.   Neurological:  Positive for numbness (neuropathy of the hands and feet, severe). Negative for dizziness, extremity weakness, gait  problem, headaches, light-headedness, seizures and speech difficulty.  Hematological: Negative.   Psychiatric/Behavioral:  Negative for depression and sleep disturbance. The patient is not nervous/anxious.        Emotional due to her pain    VITALS:  Blood pressure 109/63, pulse 96, temperature 98.1 F (36.7 C), temperature source Oral, resp. rate 16, height $RemoveBe'5\' 9"'IAYLYCgvv$  (1.753 m), weight 175 lb (79.4 kg), SpO2 93 %.  Wt Readings from Last 3 Encounters:  01/06/21 175 lb (79.4 kg)  12/17/20 177 lb 4 oz (80.4 kg)  12/16/20 177 lb 14.4 oz (80.7 kg)    Body mass index is 25.84 kg/m.  Performance status (ECOG): 1 - Symptomatic but completely ambulatory  PHYSICAL EXAM:  Physical Exam Constitutional:      General: She is not in acute distress.    Appearance: Normal appearance. She is normal weight.  HENT:     Head: Normocephalic and atraumatic.  Eyes:     General: No scleral icterus.    Extraocular Movements: Extraocular movements intact.     Conjunctiva/sclera: Conjunctivae normal.     Pupils: Pupils are equal, round, and reactive to light.  Cardiovascular:     Rate and Rhythm: Normal rate and regular rhythm.     Pulses: Normal pulses.     Heart sounds: Normal heart sounds. No murmur heard.   No friction rub. No gallop.  Pulmonary:     Effort: Pulmonary effort is normal. No respiratory distress.     Breath sounds: Normal breath sounds.  Abdominal:     General: Bowel sounds are normal. There is no distension.     Palpations: Abdomen is soft. There is no hepatomegaly, splenomegaly or mass.     Tenderness: There is no abdominal tenderness.  Musculoskeletal:        General: Normal range of motion.     Cervical back: Normal range of motion and neck supple.     Right lower leg: No edema.     Left lower leg: No edema.  Lymphadenopathy:     Cervical: No cervical adenopathy.  Skin:    General: Skin is warm and dry.  Neurological:     General: No focal deficit present.     Mental  Status: She is alert and oriented to person, place, and time. Mental status is at baseline.  Psychiatric:        Mood and Affect:  Mood normal.        Behavior: Behavior normal.        Thought Content: Thought content normal.        Judgment: Judgment normal.    LABS:   CBC Latest Ref Rng & Units 01/06/2021 12/16/2020 11/19/2020  WBC - 7.9 8.4 7.4  Hemoglobin 12.0 - 16.0 12.7 12.7 12.5  Hematocrit 36 - 46 37 37 37  Platelets 150 - 399 222 206 221   CMP Latest Ref Rng & Units 01/06/2021 12/16/2020 11/19/2020  BUN 4 - 21 23(A) 16 23(A)  Creatinine 0.5 - 1.1 1.3(A) 1.0 1.1  Sodium 137 - 147 141 140 142  Potassium 3.4 - 5.3 3.7 3.9 4.0  Chloride 99 - 108 106 110(A) 111(A)  CO2 13 - 22 25(A) 24(A) 23(A)  Calcium 8.7 - 10.7 9.7 9.5 9.7  Alkaline Phos 25 - 125 108 106 127(A)  AST 13 - 35 _0 ALT 7 - 35 _1 STUDIES:  No results found.    HISTORY:   Allergies:  Allergies  Allergen Reactions   Metformin And Related Other (See Comments)    Twitching, nausea    Current Medications: Current Outpatient Medications  Medication Sig Dispense Refill   amLODipine (NORVASC) 5 MG tablet Take 1 tablet (5 mg total) by mouth daily. 30 tablet 0   Blood Glucose Monitoring Suppl (TRUE METRIX METER) DEVI dx 11.9 (Medicaid preferred) USE FOR TESTING BLOOD SUGARS when Fasting daily     COVID-19 mRNA vaccine, Moderna, 100 MCG/0.5ML injection USE AS DIRECTED .25 mL 0   gabapentin (NEURONTIN) 300 MG capsule Take 1 capsule (300 mg total) by mouth 3 (three) times daily. 90 capsule 5   GNP ULTICARE PEN NEEDLES 32G X 4 MM MISC      hydrochlorothiazide (HYDRODIURIL) 25 MG tablet TAKE 1 TABLET (25 MG TOTAL) BY MOUTH DAILY. 90 tablet 0   insulin glargine (LANTUS) 100 UNIT/ML Solostar Pen Inject into the skin. 10 units in the morning and 40 units at bedtime     JANUVIA 100 MG tablet Take 100 mg by mouth daily.     KLOR-CON M20 20 MEQ tablet TAKE 1 TABLET BY MOUTH TWICE A DAY 180 tablet 0    losartan (COZAAR) 50 MG tablet TAKE 1 TABLET BY MOUTH EVERY DAY 30 tablet 2   ondansetron (ZOFRAN) 4 MG tablet Take 1 tablet (4 mg total) by mouth every 4 (four) hours as needed for nausea. 90 tablet 3   Oxycodone HCl 10 MG TABS Take 1-1.5 tablets (10-15 mg total) by mouth every 4 (four) hours as needed. 120 tablet 0   prochlorperazine (COMPAZINE) 10 MG tablet Take 1 tablet (10 mg total) by mouth every 6 (six) hours as needed for nausea or vomiting. 90 tablet 3   TRUE METRIX BLOOD GLUCOSE TEST test strip SMARTSIG:1 Via Meter Daily PRN     TRUEplus Lancets 28G MISC SMARTSIG:1 Topical Daily PRN     No current facility-administered medications for this visit.    I, Rita Ohara, am acting as scribe for Derwood Kaplan, MD  I have reviewed this report as typed by the medical scribe, and it is complete and accurate.

## 2021-01-06 ENCOUNTER — Other Ambulatory Visit: Payer: Self-pay | Admitting: Hematology and Oncology

## 2021-01-06 ENCOUNTER — Other Ambulatory Visit: Payer: Self-pay | Admitting: Oncology

## 2021-01-06 ENCOUNTER — Inpatient Hospital Stay: Payer: Medicaid Other

## 2021-01-06 ENCOUNTER — Inpatient Hospital Stay: Payer: Medicaid Other | Attending: Hematology and Oncology | Admitting: Oncology

## 2021-01-06 VITALS — BP 109/63 | HR 96 | Temp 98.1°F | Resp 16 | Ht 69.0 in | Wt 175.0 lb

## 2021-01-06 DIAGNOSIS — G620 Drug-induced polyneuropathy: Secondary | ICD-10-CM | POA: Diagnosis not present

## 2021-01-06 DIAGNOSIS — Z5112 Encounter for antineoplastic immunotherapy: Secondary | ICD-10-CM | POA: Insufficient documentation

## 2021-01-06 DIAGNOSIS — C50512 Malignant neoplasm of lower-outer quadrant of left female breast: Secondary | ICD-10-CM

## 2021-01-06 DIAGNOSIS — Z79899 Other long term (current) drug therapy: Secondary | ICD-10-CM | POA: Insufficient documentation

## 2021-01-06 DIAGNOSIS — G629 Polyneuropathy, unspecified: Secondary | ICD-10-CM | POA: Insufficient documentation

## 2021-01-06 DIAGNOSIS — T451X5A Adverse effect of antineoplastic and immunosuppressive drugs, initial encounter: Secondary | ICD-10-CM

## 2021-01-06 DIAGNOSIS — Z171 Estrogen receptor negative status [ER-]: Secondary | ICD-10-CM | POA: Insufficient documentation

## 2021-01-06 DIAGNOSIS — D0512 Intraductal carcinoma in situ of left breast: Secondary | ICD-10-CM | POA: Insufficient documentation

## 2021-01-06 DIAGNOSIS — Z923 Personal history of irradiation: Secondary | ICD-10-CM | POA: Insufficient documentation

## 2021-01-06 LAB — HEPATIC FUNCTION PANEL
ALT: 13 (ref 7–35)
AST: 20 (ref 13–35)
Alkaline Phosphatase: 108 (ref 25–125)
Bilirubin, Total: 0.4

## 2021-01-06 LAB — CBC AND DIFFERENTIAL
HCT: 37 (ref 36–46)
Hemoglobin: 12.7 (ref 12.0–16.0)
Neutrophils Absolute: 6.24
Platelets: 222 (ref 150–399)
WBC: 7.9

## 2021-01-06 LAB — BASIC METABOLIC PANEL
BUN: 23 — AB (ref 4–21)
CO2: 25 — AB (ref 13–22)
Chloride: 106 (ref 99–108)
Creatinine: 1.3 — AB (ref 0.5–1.1)
Glucose: 154
Potassium: 3.7 (ref 3.4–5.3)
Sodium: 141 (ref 137–147)

## 2021-01-06 LAB — COMPREHENSIVE METABOLIC PANEL
Albumin: 4.3 (ref 3.5–5.0)
Calcium: 9.7 (ref 8.7–10.7)

## 2021-01-06 LAB — CBC: RBC: 4.36 (ref 3.87–5.11)

## 2021-01-06 MED ORDER — GABAPENTIN 300 MG PO CAPS: 600.0000 mg | ORAL_CAPSULE | Freq: Three times a day (TID) | ORAL | 5 refills | Status: DC

## 2021-01-06 MED ORDER — OXYCODONE HCL 10 MG PO TABS: 10.0000 mg | ORAL_TABLET | ORAL | 0 refills | Status: DC | PRN

## 2021-01-06 MED FILL — Pertuzumab Soln for IV Infusion 420 MG/14ML (30 MG/ML): INTRAVENOUS | Qty: 14 | Status: AC

## 2021-01-06 MED FILL — Trastuzumab-anns For IV Soln 150 MG: INTRAVENOUS | Qty: 22 | Status: AC

## 2021-01-07 ENCOUNTER — Encounter: Payer: Self-pay | Admitting: Oncology

## 2021-01-07 ENCOUNTER — Encounter: Payer: Self-pay | Admitting: Hematology and Oncology

## 2021-01-07 ENCOUNTER — Other Ambulatory Visit: Payer: Self-pay

## 2021-01-07 ENCOUNTER — Inpatient Hospital Stay: Payer: Medicaid Other

## 2021-01-07 VITALS — BP 121/62 | HR 94 | Temp 97.8°F | Resp 18 | Ht 69.0 in | Wt 175.0 lb

## 2021-01-07 DIAGNOSIS — Z171 Estrogen receptor negative status [ER-]: Secondary | ICD-10-CM | POA: Diagnosis not present

## 2021-01-07 DIAGNOSIS — D0512 Intraductal carcinoma in situ of left breast: Secondary | ICD-10-CM | POA: Diagnosis present

## 2021-01-07 DIAGNOSIS — Z923 Personal history of irradiation: Secondary | ICD-10-CM | POA: Diagnosis not present

## 2021-01-07 DIAGNOSIS — Z79899 Other long term (current) drug therapy: Secondary | ICD-10-CM | POA: Diagnosis not present

## 2021-01-07 DIAGNOSIS — G629 Polyneuropathy, unspecified: Secondary | ICD-10-CM | POA: Diagnosis not present

## 2021-01-07 DIAGNOSIS — C50512 Malignant neoplasm of lower-outer quadrant of left female breast: Secondary | ICD-10-CM

## 2021-01-07 DIAGNOSIS — Z5112 Encounter for antineoplastic immunotherapy: Secondary | ICD-10-CM | POA: Diagnosis present

## 2021-01-07 MED ORDER — HEPARIN SOD (PORK) LOCK FLUSH 100 UNIT/ML IV SOLN
500.0000 [IU] | Freq: Once | INTRAVENOUS | Status: AC | PRN
  Administered 2021-01-07: 500 [IU]

## 2021-01-07 MED ORDER — SODIUM CHLORIDE 0.9% FLUSH
10.0000 mL | INTRAVENOUS | Status: DC | PRN
  Administered 2021-01-07: 10 mL

## 2021-01-07 MED ORDER — SODIUM CHLORIDE 0.9 % IV SOLN
420.0000 mg | Freq: Once | INTRAVENOUS | Status: AC
  Administered 2021-01-07: 420 mg via INTRAVENOUS
  Filled 2021-01-07: qty 14

## 2021-01-07 MED ORDER — ACETAMINOPHEN 325 MG PO TABS: 650.0000 mg | ORAL_TABLET | Freq: Once | ORAL | Status: DC

## 2021-01-07 MED ORDER — SODIUM CHLORIDE 0.9 % IV SOLN: Freq: Once | INTRAVENOUS | Status: AC

## 2021-01-07 MED ORDER — TRASTUZUMAB-ANNS CHEMO 150 MG IV SOLR
6.0000 mg/kg | Freq: Once | INTRAVENOUS | Status: AC
  Administered 2021-01-07: 462 mg via INTRAVENOUS
  Filled 2021-01-07: qty 22

## 2021-01-07 MED ORDER — DIPHENHYDRAMINE HCL 50 MG/ML IJ SOLN: 25.0000 mg | Freq: Once | INTRAMUSCULAR | Status: DC

## 2021-01-07 NOTE — Patient Instructions (Signed)
[facility]  Discharge Instructions: Thank you for choosing [facility] to provide your oncology and hematology care.  If you have a lab appointment with the [facility], please go directly to the [facility] and check in at the registration area.   Wear comfortable clothing and clothing appropriate for easy access to any Portacath or PICC line.   We strive to give you quality time with your provider. You may need to reschedule your appointment if you arrive late (15 or more minutes).  Arriving late affects you and other patients whose appointments are after yours.  Also, if you miss three or more appointments without notifying the office, you may be dismissed from the clinic at the provider's discretion.      For prescription refill requests, have your pharmacy contact our office and allow 72 hours for refills to be completed.    Today you received the following chemotherapy and/or immunotherapy agents Trastuzumab/ pertuzumab      To help prevent nausea and vomiting after your treatment, we encourage you to take your nausea medication as directed.  BELOW ARE SYMPTOMS THAT SHOULD BE REPORTED IMMEDIATELY: *FEVER GREATER THAN 100.4 F (38 C) OR HIGHER *CHILLS OR SWEATING *NAUSEA AND VOMITING THAT IS NOT CONTROLLED WITH YOUR NAUSEA MEDICATION *UNUSUAL SHORTNESS OF BREATH *UNUSUAL BRUISING OR BLEEDING *URINARY PROBLEMS (pain or burning when urinating, or frequent urination) *BOWEL PROBLEMS (unusual diarrhea, constipation, pain near the anus) TENDERNESS IN MOUTH AND THROAT WITH OR WITHOUT PRESENCE OF ULCERS (sore throat, sores in mouth, or a toothache) UNUSUAL RASH, SWELLING OR PAIN  UNUSUAL VAGINAL DISCHARGE OR ITCHING   Items with * indicate a potential emergency and should be followed up as soon as possible or go to the Emergency Department if any problems should occur.  Please show the CHEMOTHERAPY ALERT CARD or IMMUNOTHERAPY ALERT CARD at  check-in to the Emergency Department and triage nurse.  Should you have questions after your visit or need to cancel or reschedule your appointment, please contact [facility]  Dept: [phone]  and follow the prompts.  Office hours are 8:00 a.m. to 4:30 p.m. Monday - Friday. Please note that voicemails left after 4:00 p.m. may not be returned until the following business day.  We are closed weekends and major holidays. You have access to a nurse at all times for urgent questions. Please call the main number to the clinic Dept: [phone] and follow the prompts.  For any non-urgent questions, you may also contact your provider using MyChart. We now offer e-Visits for anyone 20 and older to request care online for non-urgent symptoms. For details visit [email or web address].   Also download the MyChart app! Go to the app store, search "MyChart", open the app, select [facility], and log in with your MyChart username and password.  Due to Covid, a mask is required upon entering the hospital/clinic. If you do not have a mask, one will be given to you upon arrival. For doctor visits, patients may have 1 support person aged 4 or older with them. For treatment visits, patients cannot have anyone with them due to current Covid guidelines and our immunocompromised population.

## 2021-01-11 ENCOUNTER — Other Ambulatory Visit: Payer: Self-pay | Admitting: Hematology and Oncology

## 2021-01-11 DIAGNOSIS — T50905A Adverse effect of unspecified drugs, medicaments and biological substances, initial encounter: Secondary | ICD-10-CM

## 2021-01-11 DIAGNOSIS — E876 Hypokalemia: Secondary | ICD-10-CM

## 2021-01-13 ENCOUNTER — Encounter: Payer: Self-pay | Admitting: Oncology

## 2021-02-04 ENCOUNTER — Other Ambulatory Visit: Payer: Self-pay | Admitting: Hematology and Oncology

## 2021-02-04 DIAGNOSIS — T50905A Adverse effect of unspecified drugs, medicaments and biological substances, initial encounter: Secondary | ICD-10-CM

## 2021-02-04 DIAGNOSIS — E876 Hypokalemia: Secondary | ICD-10-CM

## 2021-02-15 NOTE — Progress Notes (Signed)
[facility]  [address] [place],  [place]  [address] [phone]  Clinic Day:  02/19/2021  Referring physician: Carolynne Edouard, MD  This document serves as a record of services personally performed by Hosie Poisson, MD. It was created on their behalf by Curry,Lauren E, a trained medical scribe. The creation of this record is based on the scribe's personal observations and the provider's statements to them.  ASSESSMENT & PLAN:   Assessment & Plan: 1. 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 She completed chemotherapy at the end of April 2022 and radiation therapy in June.  We have completed maintenance tastuzumab/pertuzumab for a total of 1 year of HER2 targeted therapy in December 2022. Most recent ECHO from September was normal with an EF between 55-60%.    2.  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 She has persistent numbness, which had recently worsened. We increased her gabapentin 600 mg to TID, and she has noted mild improvement. She also continues oxycodone 10 mg. I will send in a new prescription today.  3.  Renal insufficiency secondary to dehydration, slightly improved. She knows to push fluids.   Plan: She recently completed 17 cycles of maintenance trastuzumab/pertuzumab in December. She will continue gabapentin 600 mg TID. She may also continue oxycodone 10 mg for breakthrough pain, and I have agreed to refill this today. We will see her back in 2 months with CBC and CMP for repeat examination and port flush. After that appointment, we will go to 3 month follow up. She wishes to keep her port in and so we will continue to flush this every 3 months. The patient understands the plans discussed today and is in agreement with them.  She knows to contact our office if she develops concerns prior to her next  appointment.  I provided 20 minutes of face-to-face time during this this encounter and > 50% was spent counseling as documented under my assessment and plan.    [facility] [address] [place] [address] Dept: [phone] Dept Fax: [phone]   No orders of the defined types were placed in this encounter.      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  Current Treatment:  Surveillance   HISTORY OF PRESENT ILLNESS:   Oncology History  Breast cancer ([facility])  09/09/2019 Mammogram   SCREENING BILATERAL MAMMOGRAM: Further evaluation is suggested for possible mass with calcifications and possible dilated ducts in the left breast.   10/01/2019 Mammogram   DIAGNOSTIC UNILATERAL LEFT MAMMOGRAM AND LEFT BREAST ULTRASOUND: A 1.6 cm elongated mass containing pleomorphic calcifications in the 7 oclock position of the left breast, highly suspicious for breast malignancy.  Tissue sampling is recommended.  A 5 mm hypoechoic, indeterminate mass containing 2 benign-appearing calcificaitons in the 2 oclock position of the left breast.  Several retroareolar ecstatic ducts containing internal debris or possibly tissue.   10/21/2019 Pathology Results   1.  Breast, left, needle core biopsy, 5 oclock, 5 cmfn:  -  Invasive ductal carcinoma, grade 3, and high grade ductal carcinoma in situ with necrosis.  -  Tumor involves multiple cores and measures 5 mm in maximum extent in a single core 2.  Breast, left, needle core biopsy, 2 oclock:  -  Fibrocystic changes, including sclerosing adenosis with calcifications  -  No atypia, in situ or invasive malignancy identified 3.  Breast, left, needle core biopsy, 11:30 oclock:  -  Fragmented intraductal papilloma and dilated duct  -  No atypia, in situ or invasive malignancy identified  HER2:  Positive  Ration of HER2/CEN17 signals: 2.23  Average HER2 copy #/cell: 4.23 ER: Negative 0% PR: Negative 0% Ki67: 50%   11/13/2019 Breast MRI   BILATERAL BREAST MRI WITH AND WITHOUT CONTRAST: 1.  Question abnormal lymph node in the left axilla.   2, 1.5 cm enhancement in the left breast approximately 6 o'clock position with associated biopsy clip consistent with recent biopsy cancer.  3. 0.6 cm masslike enhancement in the retroareolar left breast with associated biopsy clip artifact correlating to recently biopsy proven papilloma.   12/08/2019 Initial Diagnosis   Breast cancer ([facility])   12/23/2019 Pathology Results   1.  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  Lymph node, sentinel, biopsy, left:             -  One benign, reactive lymph node with focal biopsy site reaction (0/1)  Tumor size: 1.2 x 1.0 x 0.8 cm Histologic grade: 3   01/01/2020 Cancer Staging   Staging form: Breast, AJCC 8th Edition - Clinical stage from 01/01/2020: Stage IA (cT1c, cN0(sn), cM0, G3, ER-, PR-, HER2+) - Signed by Derwood Kaplan, MD on 01/09/2020    01/29/2020 - 01/07/2021 Chemotherapy   Patient is on Treatment Plan : BREAST  Docetaxel + Carboplatin + Trastuzumab + Pertuzumab  (TCHP) q21d (COMPLETED TCHP ON 05/20/2020) / Trastuzumab + Pertuzumab q21d      06/15/2020 - 07/21/2020 Radiation Therapy   5000 cGy in 25 fractions    10/14/2020 Mammogram   DIAGNOSTIC BILATERAL MAMMOGRAM: No evidence of malignancy in either breast.    10/29/2020 Echocardiogram   Overall left ventricular systolic function is normal with, an EF between 55 - 60 %.       INTERVAL HISTORY:  Brittney Burgess is here for routine follow up after completing 1 year of  maintenance trastuzumab/pertuzumab in December. She states that she is doing fairly well. She has notes mild improvement in her neuropathy of the bilateral feet with increasing the gabapentin 600 mg TID. She continues oxycodone 10 mg for breakthrough pain, and requests a new prescription. I will do so but stressed that she needs to use this only as absolutely necessary. She has also been placed on Metformin 500 mg BID. She has had some back pain, which she rates as a 6/10 today. Blood counts are unremarkable except for a BUN of 21 and a creatinine of 1.2, improved. Her  appetite is good, and she has gained 9 pounds since her last visit.  She denies fever, chills or other signs of infection.  She denies nausea, vomiting, bowel issues, or abdominal pain.  She denies sore throat, cough, dyspnea, or  chest pain.  REVIEW OF SYSTEMS:  Review of Systems  Constitutional: Negative.  Negative for appetite change, chills, fatigue, fever and unexpected weight change.  HENT:  Negative.    Eyes: Negative.   Respiratory: Negative.  Negative for chest tightness, cough, hemoptysis, shortness of breath and wheezing.   Cardiovascular: Negative.  Negative for chest pain, leg swelling and palpitations.  Gastrointestinal: Negative.  Negative for abdominal distention, abdominal pain, blood in stool, constipation, diarrhea, nausea and vomiting.  Endocrine: Negative.   Genitourinary: Negative.  Negative for difficulty urinating, dysuria, frequency and hematuria.   Musculoskeletal:  Positive for back pain (lower). Negative for arthralgias, flank pain, gait problem and myalgias.  Skin: Negative.   Neurological:  Positive for numbness (neuropathy of the hands and feet, severe). Negative for dizziness, extremity weakness, gait problem, headaches, light-headedness, seizures and speech difficulty.  Hematological: Negative.   Psychiatric/Behavioral:  Negative for depression and sleep disturbance. The patient is not nervous/anxious.      VITALS:  Blood pressure 135/70, pulse (!) 105, temperature 97.9 F (36.6 C), temperature source Oral, resp. rate 18, height '5\' 9"'  (1.753 m), weight 184 lb 1.6 oz (83.5 kg), SpO2 98 %.  Wt Readings from Last 3 Encounters:  02/19/21 184 lb 1.6 oz (83.5 kg)  01/07/21 175 lb (79.4 kg)  01/06/21 175 lb (79.4 kg)    Body mass index is 27.19 kg/m.  Performance status (ECOG): 1 - Symptomatic but completely ambulatory  PHYSICAL EXAM:  Physical Exam Constitutional:      General: She is not in acute distress.    Appearance: Normal appearance. She is normal weight.  HENT:     Head: Normocephalic and atraumatic.  Eyes:     General: No scleral icterus.    Extraocular Movements: Extraocular movements intact.     Conjunctiva/sclera: Conjunctivae normal.     Pupils: Pupils are equal, round, and reactive to light.  Cardiovascular:     Rate and Rhythm: Regular rhythm. Tachycardia present.     Pulses: Normal pulses.     Heart sounds: Normal heart sounds. No murmur heard.   No friction rub. No gallop.  Pulmonary:     Effort: Pulmonary effort is normal. No respiratory distress.     Breath sounds: Normal breath sounds.  Chest:     Comments: Hyperpigmentation of the left breast with mild radiation changes and mild tenderness Abdominal:     General: Bowel sounds are normal. There is no distension.     Palpations: Abdomen is soft. There is no hepatomegaly, splenomegaly or mass.     Tenderness: There is no abdominal tenderness.  Musculoskeletal:        General: Normal range of motion.     Cervical back: Normal range of motion and neck supple.     Right lower leg: No edema.     Left lower leg: No edema.  Lymphadenopathy:     Cervical: No cervical adenopathy.  Skin:    General: Skin is warm and dry.  Neurological:     General: No focal deficit present.     Mental Status: She is alert and oriented to person, place, and time. Mental status is at baseline.  Psychiatric:        Mood and  Affect: Mood normal.        Behavior: Behavior normal.        Thought Content: Thought content normal.        Judgment: Judgment normal.    LABS:   CBC Latest Ref Rng &  Units 02/19/2021 02/19/2021 01/06/2021  WBC - 10.2 10.2 7.9  Hemoglobin 12.0 - 16.0 - 13.6 12.7  Hematocrit 36 - 46 - 41 37  Platelets 150 - 399 - 269 222   CMP Latest Ref Rng & Units 02/19/2021 01/06/2021 12/16/2020  BUN 4 - 21 21 23(A) 16  Creatinine 0.5 - 1.1 1.2(A) 1.3(A) 1.0  Sodium 137 - 147 139 141 140  Potassium 3.4 - 5.3 4.0 3.7 3.9  Chloride 99 - 108 108 106 110(A)  CO2 13 - 22 23(A) 25(A) 24(A)  Calcium 8.7 - 10.7 10.0 9.7 9.5  Alkaline Phos 25 - 125 147(A) 108 106  AST 13 - 35 '25 20 21  ' ALT 7 - 35 '22 13 16    ' STUDIES:  No results found.    HISTORY:   Allergies:  Allergies  Allergen Reactions   Metformin And Related Other (See Comments)    Twitching, nausea    Current Medications: Current Outpatient Medications  Medication Sig Dispense Refill   amLODipine (NORVASC) 5 MG tablet Take 1 tablet (5 mg total) by mouth daily. 30 tablet 0   Blood Glucose Monitoring Suppl (TRUE METRIX METER) DEVI dx 11.9 (Medicaid preferred) USE FOR TESTING BLOOD SUGARS when Fasting daily     COVID-19 mRNA vaccine, Moderna, 100 MCG/0.5ML injection USE AS DIRECTED .25 mL 0   gabapentin (NEURONTIN) 300 MG capsule Take 2 capsules (600 mg total) by mouth 3 (three) times daily. 180 capsule 5   GNP ULTICARE PEN NEEDLES 32G X 4 MM MISC      hydrochlorothiazide (HYDRODIURIL) 25 MG tablet TAKE 1 TABLET (25 MG TOTAL) BY MOUTH DAILY. 90 tablet 0   insulin glargine (LANTUS) 100 UNIT/ML Solostar Pen Inject into the skin. 10 units in the morning and 40 units at bedtime     JANUVIA 100 MG tablet Take 100 mg by mouth daily.     KLOR-CON M20 20 MEQ tablet TAKE 1 TABLET BY MOUTH TWICE A DAY 60 tablet 0   losartan (COZAAR) 50 MG tablet TAKE 1 TABLET BY MOUTH EVERY DAY 30 tablet 2   metFORMIN (GLUCOPHAGE) 500 MG tablet Take 500 mg by  mouth 2 (two) times daily.     ondansetron (ZOFRAN) 4 MG tablet Take 1 tablet (4 mg total) by mouth every 4 (four) hours as needed for nausea. 90 tablet 3   Oxycodone HCl 10 MG TABS Take 1-1.5 tablets (10-15 mg total) by mouth every 4 (four) hours as needed. 120 tablet 0   prochlorperazine (COMPAZINE) 10 MG tablet Take 1 tablet (10 mg total) by mouth every 6 (six) hours as needed for nausea or vomiting. 90 tablet 3   TRUE METRIX BLOOD GLUCOSE TEST test strip SMARTSIG:1 Via Meter Daily PRN     TRUEplus Lancets 28G MISC SMARTSIG:1 Topical Daily PRN     No current facility-administered medications for this visit.    I, Rita Ohara, am acting as scribe for Derwood Kaplan, MD  I have reviewed this report as typed by the medical scribe, and it is complete and accurate.

## 2021-02-19 ENCOUNTER — Inpatient Hospital Stay: Payer: Medicaid Other | Attending: Hematology and Oncology | Admitting: Oncology

## 2021-02-19 ENCOUNTER — Encounter: Payer: Self-pay | Admitting: Oncology

## 2021-02-19 ENCOUNTER — Other Ambulatory Visit: Payer: Self-pay | Admitting: Hematology and Oncology

## 2021-02-19 ENCOUNTER — Other Ambulatory Visit: Payer: Self-pay | Admitting: Oncology

## 2021-02-19 ENCOUNTER — Other Ambulatory Visit: Payer: Self-pay

## 2021-02-19 ENCOUNTER — Inpatient Hospital Stay: Payer: Medicaid Other

## 2021-02-19 ENCOUNTER — Telehealth: Payer: Self-pay | Admitting: Oncology

## 2021-02-19 VITALS — BP 135/70 | HR 105 | Temp 97.9°F | Resp 18 | Ht 69.0 in | Wt 184.1 lb

## 2021-02-19 DIAGNOSIS — C50512 Malignant neoplasm of lower-outer quadrant of left female breast: Secondary | ICD-10-CM

## 2021-02-19 DIAGNOSIS — G620 Drug-induced polyneuropathy: Secondary | ICD-10-CM

## 2021-02-19 DIAGNOSIS — T451X5A Adverse effect of antineoplastic and immunosuppressive drugs, initial encounter: Secondary | ICD-10-CM

## 2021-02-19 DIAGNOSIS — Z171 Estrogen receptor negative status [ER-]: Secondary | ICD-10-CM

## 2021-02-19 LAB — HEPATIC FUNCTION PANEL
ALT: 22 (ref 7–35)
AST: 25 (ref 13–35)
Alkaline Phosphatase: 147 — AB (ref 25–125)
Bilirubin, Total: 0.7

## 2021-02-19 LAB — COMPREHENSIVE METABOLIC PANEL
Albumin: 4.6 (ref 3.5–5.0)
Calcium: 10 (ref 8.7–10.7)

## 2021-02-19 LAB — BASIC METABOLIC PANEL
BUN: 21 (ref 4–21)
CO2: 23 — AB (ref 13–22)
Chloride: 108 (ref 99–108)
Creatinine: 1.2 — AB (ref 0.5–1.1)
Glucose: 186
Potassium: 4 (ref 3.4–5.3)
Sodium: 139 (ref 137–147)

## 2021-02-19 LAB — CBC AND DIFFERENTIAL
HCT: 41 (ref 36–46)
Hemoglobin: 13.6 (ref 12.0–16.0)
Neutrophils Absolute: 8.06
Platelets: 269 (ref 150–399)
WBC: 10.2
WBC: 10.2

## 2021-02-19 LAB — CBC: RBC: 4.74 (ref 3.87–5.11)

## 2021-02-19 MED ORDER — OXYCODONE HCL 10 MG PO TABS: 10.0000 mg | ORAL_TABLET | ORAL | 0 refills | Status: DC | PRN

## 2021-02-19 NOTE — Telephone Encounter (Signed)
Patient has been scheduled for follow-up visit per 02/19/21 los. Pt given an appt calendar with date and time. ° °

## 2021-03-01 ENCOUNTER — Other Ambulatory Visit: Payer: Self-pay | Admitting: Hematology and Oncology

## 2021-03-01 DIAGNOSIS — T50905A Adverse effect of unspecified drugs, medicaments and biological substances, initial encounter: Secondary | ICD-10-CM

## 2021-03-01 DIAGNOSIS — E876 Hypokalemia: Secondary | ICD-10-CM

## 2021-03-12 ENCOUNTER — Other Ambulatory Visit: Payer: Self-pay | Admitting: Hematology and Oncology

## 2021-03-12 DIAGNOSIS — R600 Localized edema: Secondary | ICD-10-CM

## 2021-03-23 ENCOUNTER — Other Ambulatory Visit: Payer: Self-pay | Admitting: Hematology and Oncology

## 2021-03-23 DIAGNOSIS — T50905A Adverse effect of unspecified drugs, medicaments and biological substances, initial encounter: Secondary | ICD-10-CM

## 2021-03-23 DIAGNOSIS — E876 Hypokalemia: Secondary | ICD-10-CM

## 2021-04-02 ENCOUNTER — Other Ambulatory Visit: Payer: Self-pay

## 2021-04-02 DIAGNOSIS — T451X5A Adverse effect of antineoplastic and immunosuppressive drugs, initial encounter: Secondary | ICD-10-CM

## 2021-04-02 DIAGNOSIS — G620 Drug-induced polyneuropathy: Secondary | ICD-10-CM

## 2021-04-02 MED ORDER — OXYCODONE HCL 10 MG PO TABS: 10.0000 mg | ORAL_TABLET | ORAL | 0 refills | Status: DC | PRN

## 2021-04-19 ENCOUNTER — Other Ambulatory Visit: Payer: Medicaid Other

## 2021-04-19 ENCOUNTER — Inpatient Hospital Stay: Payer: Medicaid Other | Attending: Hematology and Oncology

## 2021-04-19 ENCOUNTER — Other Ambulatory Visit: Payer: Self-pay

## 2021-04-19 ENCOUNTER — Encounter: Payer: Self-pay | Admitting: Hematology and Oncology

## 2021-04-19 ENCOUNTER — Inpatient Hospital Stay (INDEPENDENT_AMBULATORY_CARE_PROVIDER_SITE_OTHER): Payer: Medicaid Other | Admitting: Hematology and Oncology

## 2021-04-19 ENCOUNTER — Ambulatory Visit: Payer: Medicaid Other | Admitting: Hematology and Oncology

## 2021-04-19 VITALS — BP 161/89 | HR 95 | Temp 97.4°F | Resp 18 | Ht 69.0 in | Wt 187.0 lb

## 2021-04-19 DIAGNOSIS — C50512 Malignant neoplasm of lower-outer quadrant of left female breast: Secondary | ICD-10-CM

## 2021-04-19 DIAGNOSIS — Z923 Personal history of irradiation: Secondary | ICD-10-CM | POA: Diagnosis not present

## 2021-04-19 DIAGNOSIS — Z171 Estrogen receptor negative status [ER-]: Secondary | ICD-10-CM | POA: Diagnosis not present

## 2021-04-19 DIAGNOSIS — G620 Drug-induced polyneuropathy: Secondary | ICD-10-CM

## 2021-04-19 DIAGNOSIS — N6042 Mammary duct ectasia of left breast: Secondary | ICD-10-CM | POA: Diagnosis not present

## 2021-04-19 DIAGNOSIS — E876 Hypokalemia: Secondary | ICD-10-CM | POA: Diagnosis not present

## 2021-04-19 DIAGNOSIS — T451X5A Adverse effect of antineoplastic and immunosuppressive drugs, initial encounter: Secondary | ICD-10-CM | POA: Diagnosis not present

## 2021-04-19 DIAGNOSIS — N6022 Fibroadenosis of left breast: Secondary | ICD-10-CM | POA: Insufficient documentation

## 2021-04-19 DIAGNOSIS — Z79899 Other long term (current) drug therapy: Secondary | ICD-10-CM | POA: Diagnosis not present

## 2021-04-19 LAB — CBC: RBC: 4.51 (ref 3.87–5.11)

## 2021-04-19 LAB — HEPATIC FUNCTION PANEL
ALT: 21 U/L (ref 7–35)
AST: 22 (ref 13–35)
Alkaline Phosphatase: 129 — AB (ref 25–125)
Bilirubin, Total: 0.4

## 2021-04-19 LAB — COMPREHENSIVE METABOLIC PANEL
Albumin: 4.2 (ref 3.5–5.0)
Calcium: 9.6 (ref 8.7–10.7)

## 2021-04-19 LAB — CBC AND DIFFERENTIAL
HCT: 39 (ref 36–46)
Hemoglobin: 12.7 (ref 12.0–16.0)
Neutrophils Absolute: 6.24
Platelets: 220 10*3/uL (ref 150–400)
WBC: 8

## 2021-04-19 LAB — BASIC METABOLIC PANEL
BUN: 15 (ref 4–21)
CO2: 27 — AB (ref 13–22)
Chloride: 104 (ref 99–108)
Creatinine: 1 (ref 0.5–1.1)
Glucose: 232
Potassium: 3.3 mEq/L — AB (ref 3.5–5.1)
Sodium: 138 (ref 137–147)

## 2021-04-19 MED ORDER — HEPARIN SOD (PORK) LOCK FLUSH 100 UNIT/ML IV SOLN
500.0000 [IU] | Freq: Once | INTRAVENOUS | Status: AC | PRN
  Administered 2021-04-19: 500 [IU]

## 2021-04-19 MED ORDER — SODIUM CHLORIDE 0.9% FLUSH
10.0000 mL | INTRAVENOUS | Status: DC | PRN
  Administered 2021-04-19: 10 mL

## 2021-04-19 NOTE — Progress Notes (Signed)
?Patient Care Team: ?Carolynne Edouard, MD as PCP - General (Family Medicine) ?Derwood Kaplan, MD as Consulting Physician (Oncology) ?Pollyann Samples, MD as Consulting Physician (General Surgery) ?Gatha Mayer, MD as Consulting Physician (Radiation Oncology) ? ?Clinic Day:  04/19/2021 ? ?Referring physician: Carolynne Edouard, MD ? ?ASSESSMENT & PLAN:  ? 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 She completed chemotherapy at the end of April 2022 and radiation therapy in June.  We have completed maintenance tastuzumab/pertuzumab for a total of 1 year of HER2 targeted therapy in December 2022. Most recent ECHO from September was normal with an EF between 55-60%. She will return to clinic in 3 months for repeat evaluation. ? 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 She has persistent numbness, which had recently worsened. We increased her gabapentin 600 mg to TID, and she has noted mild improvement. She also continues oxycodone 10 mg, we will refill as needed.  ? ?The patient understands the plans discussed today and is in agreement with them.  She knows to contact our office if she develops concerns prior to her next appointment. ? ? ? ?[name], NP  ?[facility] ?[place] ?[place] [address] ?Dept: [phone] ?Dept Fax: [phone]  ? ?No orders of the defined types were placed in this encounter. ?  ? ? ?CHIEF COMPLAINT:  ?CC: A 61 year old female with history of breast cancer here for 2 month evaluation ? ?Current Treatment:  Surveillance ? ?INTERVAL HISTORY:  ?Christe is here today for repeat clinical assessment. She denies fevers or chills. She denies pain. Her appetite is good. Her weight has been stable. ? ?I have  reviewed the past medical history, past surgical history, social history and family history with the patient and they are unchanged from previous note. ? ?ALLERGIES:  is allergic to metformin and related. ? ?MEDICATIONS:  ?Current Outpatient Medications  ?Medication Sig Dispense Refill  ? hydrochlorothiazide (HYDRODIURIL) 25 MG tablet TAKE 1 TABLET (25 MG TOTAL) BY MOUTH DAILY. 45 tablet 0  ? amLODipine (NORVASC) 5 MG tablet Take 1 tablet (5 mg total) by mouth daily. 30 tablet 0  ? Blood Glucose Monitoring Suppl (TRUE METRIX METER) DEVI dx 11.9 (Medicaid preferred) USE FOR TESTING BLOOD SUGARS when Fasting daily    ? gabapentin (NEURONTIN) 300 MG capsule Take 2 capsules (600 mg total) by mouth 3 (three) times daily. 180 capsule 5  ? GNP ULTICARE PEN NEEDLES 32G X 4 MM MISC     ? insulin glargine (LANTUS) 100 UNIT/ML Solostar Pen Inject into the skin. 10 units in the morning and 40 units at bedtime    ? JANUVIA 100 MG tablet Take 100 mg by mouth daily.    ? KLOR-CON M20 20 MEQ tablet TAKE 1 TABLET BY MOUTH TWICE A DAY 60 tablet 1  ? losartan (COZAAR) 50 MG tablet TAKE 1 TABLET BY MOUTH EVERY DAY 30 tablet 2  ? ondansetron (ZOFRAN) 4 MG tablet Take 1 tablet (4 mg total) by mouth every 4 (four) hours as needed for nausea. 90 tablet 3  ? Oxycodone HCl 10 MG TABS Take 1-1.5 tablets (10-15 mg total) by mouth every 4 (four) hours as needed. 120 tablet 0  ? prochlorperazine (COMPAZINE) 10 MG tablet Take 1 tablet (10 mg  total) by mouth every 6 (six) hours as needed for nausea or vomiting. 90 tablet 3  ? TRUE METRIX BLOOD GLUCOSE TEST test strip SMARTSIG:1 Via Meter Daily PRN    ? TRUEplus Lancets 28G MISC SMARTSIG:1 Topical Daily PRN    ? ?Current Facility-Administered Medications  ?Medication Dose Route Frequency Provider Last Rate Last Admin  ? sodium chloride flush (NS) 0.9 % injection 10 mL  10 mL Intracatheter PRN [place] Scrape A, NP   10 mL at 04/19/21 1402  ? ? ?HISTORY OF PRESENT ILLNESS:  ? ?Oncology History   ?Breast cancer ([place] [facility])  ?09/09/2019 Mammogram  ? SCREENING BILATERAL MAMMOGRAM: Further evaluation is suggested for possible mass with calcifications and possible dilated ducts in the left breast. ?  ?10/01/2019 Mammogram  ? DIAGNOSTIC UNILATERAL LEFT MAMMOGRAM AND LEFT BREAST ULTRASOUND: ?A 1.6 cm elongated mass containing pleomorphic calcifications in the 7 o?clock position of the left breast, highly suspicious for breast malignancy.  Tissue sampling is recommended. ? ?A 5 mm hypoechoic, indeterminate mass containing 2 benign-appearing calcificaitons in the 2 o?clock position of the left breast. ? ?Several retroareolar ecstatic ducts containing internal debris or possibly tissue. ?  ?10/21/2019 Pathology Results  ? 1.  Breast, left, needle core biopsy, 5 o?clock, 5 cmfn: ? -  Invasive ductal carcinoma, grade 3, and high grade ductal carcinoma in situ with necrosis. ? -  Tumor involves multiple cores and measures 5 mm in maximum extent in a single core ?2.  Breast, left, needle core biopsy, 2 o?clock: ? -  Fibrocystic changes, including sclerosing adenosis with calcifications ? -  No atypia, in situ or invasive malignancy identified ?3.  Breast, left, needle core biopsy, 11:30 o?clock: ? -  Fragmented intraductal papilloma and dilated duct ? -  No atypia, in situ or invasive malignancy identified ? ?HER2: Positive ? Ration of HER2/CEN17 signals: 2.23 ? Average HER2 copy #/cell: 4.23 ?ER: Negative 0% ?PR: Negative 0% ?Ki67: 50% ?  ?11/13/2019 Breast MRI  ? BILATERAL BREAST MRI WITH AND WITHOUT CONTRAST: ?1.  Question abnormal lymph node in the left axilla. ?  ?2, 1.5 cm enhancement in the left breast approximately 6 o'clock ?position with associated biopsy clip consistent with recent biopsy ?cancer. ? ?3. 0.6 cm masslike enhancement in the retroareolar left breast with ?associated biopsy clip artifact correlating to recently biopsy ?proven papilloma. ?  ?12/08/2019 Initial Diagnosis  ? Breast cancer ([facility]) ?  ?12/23/2019  Pathology Results  ? 1.  Breast, lumpectomy, left: ?            -  Invasive and in situ ductal carcinoma, 1.2 cm ?            -  Margins not involved ?            -  Invasive carcinoma 0.2 cm from superior margin ?            -  Ductal carcinoma in situ 0.1 cm from inferior margin ?            -  Biopsy site and biopsy clip ?2.  Lymph node, sentinel, biopsy, left: ?            -  One benign, reactive lymph node with focal biopsy site reaction (0/1) ? ?Tumor size: 1.2 x 1.0 x 0.8 cm ?Histologic grade: 3 ?  ?01/01/2020 Cancer Staging  ? Staging form: Breast, AJCC 8th Edition ?- Clinical stage from 01/01/2020: Stage IA (cT1c, cN0(sn), cM0, G3, ER-, PR-, HER2+) -  Signed by Derwood Kaplan, MD on 01/09/2020 ? ?  ?01/29/2020 - 01/07/2021 Chemotherapy  ? Patient is on Treatment Plan : BREAST  Docetaxel + Carboplatin + Trastuzumab + Pertuzumab  (TCHP) q21d (COMPLETED TCHP ON 05/20/2020) / Trastuzumab + Pertuzumab q21d  ?  ?  ?06/15/2020 - 07/21/2020 Radiation Therapy  ? 5000 cGy in 25 fractions  ?  ?10/14/2020 Mammogram  ? DIAGNOSTIC BILATERAL MAMMOGRAM: No evidence of malignancy in either breast.  ?  ?10/29/2020 Echocardiogram  ? Overall left ventricular systolic function is normal with, an EF between 55 - 60 %.  ?  ?  ? ? ?REVIEW OF SYSTEMS:  ? ?Constitutional: Denies fevers, chills or abnormal weight loss ?Eyes: Denies blurriness of vision ?Ears, nose, mouth, throat, and face: Denies mucositis or sore throat ?Respiratory: Denies cough, dyspnea or wheezes ?Cardiovascular: Denies palpitation, chest discomfort or lower extremity swelling ?Gastrointestinal:  Denies nausea, heartburn or change in bowel habits ?Skin: Denies abnormal skin rashes ?Lymphatics: Denies new lymphadenopathy or easy bruising ?Neurological:Denies numbness, tingling or new weaknesses ?Behavioral/Psych: Mood is stable, no new changes  ?All other systems were reviewed with the patient and are negative. ? ? ?VITALS:  ?Blood pressure (!) 161/89, pulse 95,  temperature (!) 97.4 ?F (36.3 ?C), temperature source Oral, resp. rate 18, height _0  (1.753 m), weight 187 lb (84.8 kg), SpO2 95 %.  ?Wt Readings from Last 3 Encounters:  ?04/19/21 187 lb (84.8 kg)  ?01/20

## 2021-04-19 NOTE — Assessment & Plan Note (Addendum)
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 She completed chemotherapy at the end of April 2022 and radiation therapy in June. ?We have completed maintenance tastuzumab/pertuzumab for a total of 1 year of HER2 targeted therapy in December 2022. Most recent ECHO from September was normal with an EF between 55-60%. She will return to clinic in 3 months for repeat evaluation. ?

## 2021-04-19 NOTE — Assessment & Plan Note (Addendum)
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 She has persistent numbness, which had recently worsened. We increased her gabapentin 600 mg to TID, and she has noted mild improvement. She also continues oxycodone 10 mg, we will refill as needed. ?

## 2021-04-20 ENCOUNTER — Other Ambulatory Visit: Payer: Self-pay | Admitting: Hematology and Oncology

## 2021-04-20 DIAGNOSIS — T50905A Adverse effect of unspecified drugs, medicaments and biological substances, initial encounter: Secondary | ICD-10-CM

## 2021-04-20 DIAGNOSIS — E876 Hypokalemia: Secondary | ICD-10-CM

## 2021-04-21 ENCOUNTER — Encounter: Payer: Self-pay | Admitting: Hematology and Oncology

## 2021-04-22 ENCOUNTER — Other Ambulatory Visit: Payer: Self-pay

## 2021-04-22 ENCOUNTER — Other Ambulatory Visit: Payer: Self-pay | Admitting: Hematology and Oncology

## 2021-04-22 DIAGNOSIS — R600 Localized edema: Secondary | ICD-10-CM

## 2021-04-22 DIAGNOSIS — G620 Drug-induced polyneuropathy: Secondary | ICD-10-CM

## 2021-04-22 MED ORDER — OXYCODONE HCL 10 MG PO TABS: 10.0000 mg | ORAL_TABLET | ORAL | 0 refills | Status: DC | PRN

## 2021-05-13 ENCOUNTER — Other Ambulatory Visit: Payer: Self-pay

## 2021-05-13 ENCOUNTER — Encounter (HOSPITAL_COMMUNITY): Payer: Self-pay | Admitting: Oral Surgery

## 2021-05-13 NOTE — H&P (Signed)
?  Patient: Brittney Burgess  PID: [id number]  DOB: 1960-11-05  SEX: Female  ? ?Patient referred by DDS for extraction teeth ? ?CC: Painful teeth. ? ?Past Medical History:  Diabetes, Smoker, Irregular Heart Beat, Obese   ? ?Medications: Amlodipine, Gabapentin, Insulin, HCTZ, Lantus, Januvia, Klor-Con, Losartan, Zofran, Oxycodone, Compazine   ? ?Allergies:     NKDA   ? ?Surgeries:   breast cancer, Portacath/Pic    ? ?Social History       ?Smoking:  1/2 ppd          ?Alcohol: ?Drug use:            ?                 ?Exam: BMI 29. Gross decay/perio disease teeth # 2, 3, 4, 5, 6, 7, 8, 9, 10, 11, 14, 15, 17, 18, 20, 24, 25, 29.  No purulence, edema, fluctuance, trismus. Oral cancer screening negative. Pharynx clear. No lymphadenopathy. ? ?Panorex:Gross decay/perio disease teeth # 2, 3, 4, 5, 6, 7, 8, 9, 10, 11, 14, 15, 17, 18, 20, 24, 25, 29. ? ?Assessment: ASA 3. Non-restorable   teeth # 2, 3, 4, 5, 6, 7, 8, 9, 10, 11, 14, 15, 17, 18, 20, 24, 25, 29.           ? ?Plan: Extraction Teeth #2, 3, 4, 5, 6, 7, 8, 9, 10, 11, 14, 15, 17, 18, 20, 24, 25, 29. Alveloplasty. Hospital Day surgery.                ? ?Rx: n              ? ?Risks and complications explained. Questions answered.  ? ?Gae Bon, DMD ? ?

## 2021-05-13 NOTE — Progress Notes (Signed)
TWO VISITORS ARE ALLOWED TO COME WITH YOU AND STAY IN THE SURGICAL WAITING ROOM ONLY DURING PRE OP AND PROCEDURE DAY OF SURGERY.  ? ?PCP - Dr Carolynne Edouard ?Cardiologist - n/a ? ?Chest x-ray - n/a ?EKG - DOS ?Stress Test - years ago in [place] ?ECHO - 02/22/19 ?Cardiac Cath - n/a ? ?ICD Pacemaker/Loop - n/a ? ?Sleep Study -  n/a ?CPAP - none ? ?Do not take Januvia on the morning of surgery. ? ?THE NIGHT BEFORE SURGERY, take 20 Units Lantus Insulin.   ?     ?THE MORNING OF SURGERY, take 5 units Lantus Insulin.   ? ?If your blood sugar is less than 70 mg/dL, you will need to treat for low blood sugar: ?Treat a low blood sugar (less than 70 mg/dL) with ? cup of clear juice (cranberry or apple), 4 glucose tablets, OR glucose gel. ?Recheck blood sugar in 15 minutes after treatment (to make sure it is greater than 70 mg/dL). If your blood sugar is not greater than 70 mg/dL on recheck, call [phone] for further instructions. ? ?STOP now taking any Aspirin (unless otherwise instructed by your surgeon), Aleve, Naproxen, Ibuprofen, Motrin, Advil, Goody's, BC's, all herbal medications, fish oil, and all vitamins.  ? ?Coronavirus Screening ?Do you have any of the following symptoms:  ?Cough yes/no: No ?Fever (>100.32F)  yes/no: No ?Runny nose yes/no: No ?Sore throat yes/no: No ?Difficulty breathing/shortness of breath  yes/no: No ? ?Have you traveled in the last 14 days and where? yes/no: No ? ?Patient verbalized understanding of instructions that were given via phone. ?

## 2021-05-14 ENCOUNTER — Other Ambulatory Visit: Payer: Self-pay

## 2021-05-14 ENCOUNTER — Encounter (HOSPITAL_COMMUNITY): Payer: Self-pay | Admitting: Oral Surgery

## 2021-05-14 ENCOUNTER — Other Ambulatory Visit: Payer: Self-pay | Admitting: Hematology and Oncology

## 2021-05-14 ENCOUNTER — Encounter (HOSPITAL_COMMUNITY)
Admission: RE | Disposition: A | Discharge status: Home / Self Care | Payer: Self-pay | Source: Home / Self Care | Attending: Oral Surgery

## 2021-05-14 ENCOUNTER — Ambulatory Visit (HOSPITAL_BASED_OUTPATIENT_CLINIC_OR_DEPARTMENT_OTHER): Payer: Medicaid Other | Admitting: Anesthesiology

## 2021-05-14 ENCOUNTER — Ambulatory Visit (HOSPITAL_COMMUNITY)
Admission: RE | Admit: 2021-05-14 | Discharge: 2021-05-14 | Disposition: A | Discharge status: Home / Self Care | Payer: Medicaid Other | Attending: Oral Surgery | Admitting: Oral Surgery

## 2021-05-14 ENCOUNTER — Ambulatory Visit (HOSPITAL_COMMUNITY): Payer: Medicaid Other | Admitting: Anesthesiology

## 2021-05-14 DIAGNOSIS — I10 Essential (primary) hypertension: Secondary | ICD-10-CM | POA: Diagnosis not present

## 2021-05-14 DIAGNOSIS — E119 Type 2 diabetes mellitus without complications: Secondary | ICD-10-CM | POA: Insufficient documentation

## 2021-05-14 DIAGNOSIS — E039 Hypothyroidism, unspecified: Secondary | ICD-10-CM | POA: Diagnosis not present

## 2021-05-14 DIAGNOSIS — F17210 Nicotine dependence, cigarettes, uncomplicated: Secondary | ICD-10-CM | POA: Insufficient documentation

## 2021-05-14 DIAGNOSIS — E876 Hypokalemia: Secondary | ICD-10-CM

## 2021-05-14 DIAGNOSIS — E0590 Thyrotoxicosis, unspecified without thyrotoxic crisis or storm: Secondary | ICD-10-CM | POA: Diagnosis not present

## 2021-05-14 DIAGNOSIS — K0889 Other specified disorders of teeth and supporting structures: Secondary | ICD-10-CM

## 2021-05-14 DIAGNOSIS — Z794 Long term (current) use of insulin: Secondary | ICD-10-CM

## 2021-05-14 HISTORY — PX: TOOTH EXTRACTION: SHX859

## 2021-05-14 HISTORY — DX: Presence of other vascular implants and grafts: Z95.828

## 2021-05-14 HISTORY — DX: Dependence on other enabling machines and devices: Z99.89

## 2021-05-14 HISTORY — DX: Myoneural disorder, unspecified: G70.9

## 2021-05-14 LAB — GLUCOSE, CAPILLARY
Glucose-Capillary: 164 mg/dL — ABNORMAL HIGH (ref 70–99)
Glucose-Capillary: 243 mg/dL — ABNORMAL HIGH (ref 70–99)

## 2021-05-14 SURGERY — DENTAL RESTORATION/EXTRACTIONS
Anesthesia: General | Site: Mouth

## 2021-05-14 MED ORDER — MIDAZOLAM HCL 2 MG/2ML IJ SOLN
INTRAMUSCULAR | Status: AC
  Filled 2021-05-14: qty 2

## 2021-05-14 MED ORDER — CHLORHEXIDINE GLUCONATE 0.12 % MT SOLN: 15.0000 mL | Freq: Once | OROMUCOSAL | Status: AC

## 2021-05-14 MED ORDER — SUGAMMADEX SODIUM 200 MG/2ML IV SOLN
INTRAVENOUS | Status: DC | PRN
  Administered 2021-05-14: 200 mg via INTRAVENOUS

## 2021-05-14 MED ORDER — FENTANYL CITRATE (PF) 250 MCG/5ML IJ SOLN
INTRAMUSCULAR | Status: DC | PRN
  Administered 2021-05-14 (×2): 50 ug via INTRAVENOUS

## 2021-05-14 MED ORDER — ORAL CARE MOUTH RINSE: 15.0000 mL | Freq: Once | OROMUCOSAL | Status: AC

## 2021-05-14 MED ORDER — FENTANYL CITRATE (PF) 250 MCG/5ML IJ SOLN
INTRAMUSCULAR | Status: AC
  Filled 2021-05-14: qty 5

## 2021-05-14 MED ORDER — PROPOFOL 10 MG/ML IV BOLUS
INTRAVENOUS | Status: AC
  Filled 2021-05-14: qty 20

## 2021-05-14 MED ORDER — DEXAMETHASONE SODIUM PHOSPHATE 10 MG/ML IJ SOLN
INTRAMUSCULAR | Status: DC | PRN
Start: 2021-05-14 — End: 2021-05-14
  Administered 2021-05-14: 10 mg via INTRAVENOUS

## 2021-05-14 MED ORDER — LIDOCAINE 2% (20 MG/ML) 5 ML SYRINGE
INTRAMUSCULAR | Status: AC
  Filled 2021-05-14: qty 5

## 2021-05-14 MED ORDER — SODIUM CHLORIDE 0.9 % IR SOLN
Status: DC | PRN
  Administered 2021-05-14: 1000 mL

## 2021-05-14 MED ORDER — ONDANSETRON HCL 4 MG/2ML IJ SOLN
INTRAMUSCULAR | Status: AC
  Filled 2021-05-14: qty 2

## 2021-05-14 MED ORDER — LIDOCAINE 2% (20 MG/ML) 5 ML SYRINGE
INTRAMUSCULAR | Status: DC | PRN
  Administered 2021-05-14: 80 mg via INTRAVENOUS

## 2021-05-14 MED ORDER — AMOXICILLIN 500 MG PO CAPS: 500.0000 mg | ORAL_CAPSULE | Freq: Three times a day (TID) | ORAL | 0 refills | Status: DC

## 2021-05-14 MED ORDER — CEFAZOLIN SODIUM-DEXTROSE 2-4 GM/100ML-% IV SOLN
2.0000 g | INTRAVENOUS | Status: AC
  Administered 2021-05-14: 2 g via INTRAVENOUS

## 2021-05-14 MED ORDER — ONDANSETRON HCL 4 MG/2ML IJ SOLN
INTRAMUSCULAR | Status: DC | PRN
  Administered 2021-05-14: 4 mg via INTRAVENOUS

## 2021-05-14 MED ORDER — LIDOCAINE-EPINEPHRINE 2 %-1:100000 IJ SOLN
INTRAMUSCULAR | Status: DC | PRN
Start: 2021-05-14 — End: 2021-05-14
  Administered 2021-05-14: 14 mL

## 2021-05-14 MED ORDER — PHENYLEPHRINE 40 MCG/ML (10ML) SYRINGE FOR IV PUSH (FOR BLOOD PRESSURE SUPPORT)
PREFILLED_SYRINGE | INTRAVENOUS | Status: DC | PRN
  Administered 2021-05-14 (×4): 80 ug via INTRAVENOUS

## 2021-05-14 MED ORDER — OXYMETAZOLINE HCL 0.05 % NA SOLN
NASAL | Status: AC
  Filled 2021-05-14: qty 30

## 2021-05-14 MED ORDER — MIDAZOLAM HCL 2 MG/2ML IJ SOLN
INTRAMUSCULAR | Status: DC | PRN
  Administered 2021-05-14: 2 mg via INTRAVENOUS

## 2021-05-14 MED ORDER — DEXMEDETOMIDINE (PRECEDEX) IN NS 20 MCG/5ML (4 MCG/ML) IV SYRINGE
PREFILLED_SYRINGE | INTRAVENOUS | Status: DC | PRN
  Administered 2021-05-14: 8 ug via INTRAVENOUS

## 2021-05-14 MED ORDER — INSULIN ASPART 100 UNIT/ML IJ SOLN
0.0000 [IU] | INTRAMUSCULAR | Status: DC | PRN
  Administered 2021-05-14: 2 [IU] via SUBCUTANEOUS

## 2021-05-14 MED ORDER — OXYMETAZOLINE HCL 0.05 % NA SOLN
NASAL | Status: DC | PRN
  Administered 2021-05-14: 2 via NASAL

## 2021-05-14 MED ORDER — MEPERIDINE HCL 25 MG/ML IJ SOLN: 6.2500 mg | INTRAMUSCULAR | Status: DC | PRN

## 2021-05-14 MED ORDER — ROCURONIUM BROMIDE 10 MG/ML (PF) SYRINGE
PREFILLED_SYRINGE | INTRAVENOUS | Status: AC
  Filled 2021-05-14: qty 10

## 2021-05-14 MED ORDER — HYDROMORPHONE HCL 1 MG/ML IJ SOLN: 0.2500 mg | INTRAMUSCULAR | Status: DC | PRN

## 2021-05-14 MED ORDER — PHENYLEPHRINE 40 MCG/ML (10ML) SYRINGE FOR IV PUSH (FOR BLOOD PRESSURE SUPPORT)
PREFILLED_SYRINGE | INTRAVENOUS | Status: AC
  Filled 2021-05-14: qty 10

## 2021-05-14 MED ORDER — OXYCODONE HCL 5 MG PO TABS: 5.0000 mg | ORAL_TABLET | Freq: Once | ORAL | Status: DC | PRN

## 2021-05-14 MED ORDER — LACTATED RINGERS IV SOLN: INTRAVENOUS | Status: DC

## 2021-05-14 MED ORDER — PROMETHAZINE HCL 25 MG/ML IJ SOLN: 6.2500 mg | INTRAMUSCULAR | Status: DC | PRN

## 2021-05-14 MED ORDER — OXYCODONE HCL 5 MG/5ML PO SOLN: 5.0000 mg | Freq: Once | ORAL | Status: DC | PRN

## 2021-05-14 MED ORDER — CEFAZOLIN SODIUM-DEXTROSE 2-4 GM/100ML-% IV SOLN
INTRAVENOUS | Status: AC
  Filled 2021-05-14: qty 100

## 2021-05-14 MED ORDER — LIDOCAINE-EPINEPHRINE 2 %-1:100000 IJ SOLN
INTRAMUSCULAR | Status: AC
  Filled 2021-05-14: qty 1

## 2021-05-14 MED ORDER — 0.9 % SODIUM CHLORIDE (POUR BTL) OPTIME
TOPICAL | Status: DC | PRN
  Administered 2021-05-14: 1000 mL

## 2021-05-14 MED ORDER — DEXAMETHASONE SODIUM PHOSPHATE 10 MG/ML IJ SOLN
INTRAMUSCULAR | Status: AC
  Filled 2021-05-14: qty 1

## 2021-05-14 MED ORDER — PROPOFOL 10 MG/ML IV BOLUS
INTRAVENOUS | Status: DC | PRN
  Administered 2021-05-14: 150 mg via INTRAVENOUS

## 2021-05-14 MED ORDER — INSULIN ASPART 100 UNIT/ML IJ SOLN
INTRAMUSCULAR | Status: AC
  Filled 2021-05-14: qty 1

## 2021-05-14 MED ORDER — AMISULPRIDE (ANTIEMETIC) 5 MG/2ML IV SOLN: 10.0000 mg | Freq: Once | INTRAVENOUS | Status: DC | PRN

## 2021-05-14 MED ORDER — CHLORHEXIDINE GLUCONATE 0.12 % MT SOLN
OROMUCOSAL | Status: AC
  Administered 2021-05-14: 15 mL via OROMUCOSAL
  Filled 2021-05-14: qty 15

## 2021-05-14 MED ORDER — ROCURONIUM BROMIDE 10 MG/ML (PF) SYRINGE
PREFILLED_SYRINGE | INTRAVENOUS | Status: DC | PRN
  Administered 2021-05-14: 20 mg via INTRAVENOUS
  Administered 2021-05-14: 80 mg via INTRAVENOUS

## 2021-05-14 MED ORDER — OXYCODONE HCL 5 MG PO TABS
5.0000 mg | ORAL_TABLET | ORAL | 0 refills | Status: DC | PRN
Start: 2021-05-14 — End: 2021-05-26

## 2021-05-14 SURGICAL SUPPLY — 39 items
BAG COUNTER SPONGE SURGICOUNT (BAG) IMPLANT
BAG SPNG CNTER NS LX DISP (BAG)
BLADE SURG 15 STRL LF DISP TIS (BLADE) ×2 IMPLANT
BLADE SURG 15 STRL SS (BLADE)
BUR CROSS CUT FISSURE 1.6 (BURR) ×2 IMPLANT
BUR EGG ELITE 4.0 (BURR) ×3 IMPLANT
CANISTER SUCT 3000ML PPV (MISCELLANEOUS) ×3 IMPLANT
COVER SURGICAL LIGHT HANDLE (MISCELLANEOUS) ×3 IMPLANT
DECANTER SPIKE VIAL GLASS SM (MISCELLANEOUS) ×2 IMPLANT
DRAPE U-SHAPE 76X120 STRL (DRAPES) ×3 IMPLANT
GAUZE PACKING FOLDED 2  STR (GAUZE/BANDAGES/DRESSINGS) ×2
GAUZE PACKING FOLDED 2 STR (GAUZE/BANDAGES/DRESSINGS) ×2 IMPLANT
GLOVE BIO SURGEON STRL SZ 6.5 (GLOVE) IMPLANT
GLOVE BIO SURGEON STRL SZ7 (GLOVE) IMPLANT
GLOVE BIO SURGEON STRL SZ8 (GLOVE) ×3 IMPLANT
GLOVE BIOGEL PI IND STRL 6.5 (GLOVE) IMPLANT
GLOVE BIOGEL PI IND STRL 7.0 (GLOVE) IMPLANT
GLOVE BIOGEL PI INDICATOR 6.5 (GLOVE)
GLOVE BIOGEL PI INDICATOR 7.0 (GLOVE)
GOWN STRL REUS W/ TWL LRG LVL3 (GOWN DISPOSABLE) ×2 IMPLANT
GOWN STRL REUS W/ TWL XL LVL3 (GOWN DISPOSABLE) ×2 IMPLANT
GOWN STRL REUS W/TWL LRG LVL3 (GOWN DISPOSABLE) ×2
GOWN STRL REUS W/TWL XL LVL3 (GOWN DISPOSABLE) ×2
IV NS 1000ML (IV SOLUTION) ×2
IV NS 1000ML BAXH (IV SOLUTION) ×2 IMPLANT
KIT BASIN OR (CUSTOM PROCEDURE TRAY) ×3 IMPLANT
KIT TURNOVER KIT B (KITS) ×3 IMPLANT
NDL HYPO 25GX1X1/2 BEV (NEEDLE) ×4 IMPLANT
NEEDLE HYPO 25GX1X1/2 BEV (NEEDLE) ×4 IMPLANT
NS IRRIG 1000ML POUR BTL (IV SOLUTION) ×3 IMPLANT
PAD ARMBOARD 7.5X6 YLW CONV (MISCELLANEOUS) ×3 IMPLANT
SLEEVE IRRIGATION ELITE 7 (MISCELLANEOUS) ×2 IMPLANT
SPONGE SURGIFOAM ABS GEL 12-7 (HEMOSTASIS) IMPLANT
SUT CHROMIC 3 0 PS 2 (SUTURE) ×4 IMPLANT
SYR BULB IRRIG 60ML STRL (SYRINGE) ×3 IMPLANT
SYR CONTROL 10ML LL (SYRINGE) ×3 IMPLANT
TRAY ENT MC OR (CUSTOM PROCEDURE TRAY) ×3 IMPLANT
TUBING IRRIGATION (MISCELLANEOUS) ×3 IMPLANT
YANKAUER SUCT BULB TIP NO VENT (SUCTIONS) ×3 IMPLANT

## 2021-05-14 NOTE — Op Note (Addendum)
05/14/2021 ? ?8:25 AM ? ?PATIENT:  Brittney Burgess  61 y.o. female ? ?PRE-OPERATIVE DIAGNOSIS:  NON RESTORABLE TEETH #2, 3, 4, 5, 6, 7, 8, 9, 10, 11, 14, 15, 17, 18, 20, 24, 25, 32 ? ?POST-OPERATIVE DIAGNOSIS:  SAME ? ?PROCEDURE:  Procedure(s): ?DENTAL EXTRACTION TEETH #2, 3, 4, 5, 6, 7, 8, 9, 10, 11, 14, 15, 17, 18, 20, 24, 25, 32 ? ?SURGEON:  Surgeon(s): ?Diona Browner, DMD ? ?ANESTHESIA:   local and general ? ?EBL:  minimal ? ?DRAINS: none  ? ?SPECIMEN:  No Specimen ? ?COUNTS:  YES ? ?PLAN OF CARE: Discharge to home after PACU ? ?PATIENT DISPOSITION:  PACU - hemodynamically stable. ?  ?PROCEDURE DETAILS: ?Dictation # ? ?Gae Bon, DMD ?05/14/2021 ?8:25 AM ? ? ? ? ? ? ? ? ? ? ? ? ? ? ? ?  ?

## 2021-05-14 NOTE — H&P (Signed)
Anesthesia H&P Update: History and Physical Exam reviewed; patient is OK for planned anesthetic and procedure. ? ?

## 2021-05-14 NOTE — Op Note (Signed)
NAME: Scholer, Rooney E. ?MEDICAL RECORD NO: [id number] ?ACCOUNT NO: [id number] ?DATE OF BIRTH: December 10, 1960 ?FACILITY: [facility] ?LOCATION: [facility] ?PHYSICIAN: Gae Bon, DDS ? ?Operative Report  ? ?DATE OF PROCEDURE: 05/14/2021 ? ?PREOPERATIVE DIAGNOSIS:  Nonrestorable teeth numbers 2, 3, 4, 5, 6, 7, 8, 9, 10, 11, 14, 15, 17, 18, 20, 24, 25 32. ? ?POSTOPERATIVE DIAGNOSIS:  Nonrestorable teeth numbers 2, 3, 4, 5, 6, 7, 8, 9, 10, 11, 14, 15, 17, 18, 20, 24, 25 32. ? ?PROCEDURE:  Extraction teeth numbers 2, 3, 4, 5, 6, 7, 8, 9, 10, 11, 14, 15, 17, 18, 20, 24, 25 32. ? ?SURGEON:  Gae Bon, DDS ? ?ANESTHESIA:  General.  Dr. Sabra Heck attending, nasal intubation. ? ?DESCRIPTION OF PROCEDURE:  The patient was taken to the operating room and placed on the table in supine position.  General anesthesia was administered and nasal endotracheal tube was placed and secured.  The eyes were protected and the patient was  ?draped for surgery.  Timeout was performed.  The posterior pharynx was suctioned and a throat pack was placed.  2% lidocaine in 1:100,000 epinephrine was infiltrated in an inferior alveolar block on the right and left side and buccal and palatal  ?infiltration in the maxilla around the teeth to be removed.  A bite block was placed on the right side of the mouth.  A sweetheart retractor was used to retract the tongue.  A #15 blade was used to make an incision around teeth numbers 17, 18, and 20.   ?The periosteum was reflected.  The teeth were elevated and removed from the mouth with the dental forceps.  The sockets were curetted, debrided, irrigated and closed with 3-0 chromic.  Then, attention was turned to the left maxilla. The 15 blade was used ? to make an incision around teeth numbers 14 and 15, carried forward on the alveolar crest to tooth #11 and then the incision was created buccally and palatally in the gingival sulcus around teeth numbers 11, 10, 9, 8, and 7.  The periosteum was  ?reflected from around  these teeth.  The teeth were elevated and removed from the mouth with dental forceps.  The sockets were curetted and debrided.  The periosteum was reflected to expose the alveolar crest, which was irregular in contour and had  ?cortical perforations in the anterior maxilla buccally where infection had eroded the bone.  These areas were curetted, debrided and alveoplasty was then performed using egg bur followed by the bone file.  Then, the left maxilla was closed with 3-0  ?chromic.  The bite block was repositioned to the other side of the mouth.  A 15 blade was used to make an incision around tooth #32 and around teeth numbers 2, 3, 4, 5 and 6.  The periosteum was reflected from around these teeth.  Another incision was  ?made around teeth numbers 24 and 25.  Then, the teeth numbers 24, 25 and 32 were removed with dental forceps.  Teeth numbers 2, 3, 4, 5 and 6 were removed with the dental forceps.  The sockets were curetted, debrided, irrigated and closed with 3-0  ?chromic.  The oral cavity was then irrigated and suctioned.  Additional local anesthesia was administered and the patient was irrigated once again and the throat pack was removed.  The patient was left under care of anesthesia for extubation and  ?transport to recovery room with plans for discharge home through day surgery. ? ?ESTIMATED BLOOD LOSS:  Minimum. ? ?  COMPLICATIONS:  None. ? ?SPECIMENS:  None. ? ? ?VAI ?D: 05/14/2021 8:31:54 am T: 05/14/2021 10:50:00 pm  ?JOB: [id number]/ [id number]  ?

## 2021-05-14 NOTE — Anesthesia Postprocedure Evaluation (Signed)
Anesthesia Post Note ? ?Patient: Brittney Burgess ? ?Procedure(s) Performed: DENTAL RESTORATION/EXTRACTIONS (Mouth) ? ?  ? ?Patient location during evaluation: PACU ?Anesthesia Type: General ?Level of consciousness: awake and alert ?Pain management: pain level controlled ?Vital Signs Assessment: post-procedure vital signs reviewed and stable ?Respiratory status: spontaneous breathing, nonlabored ventilation and respiratory function stable ?Cardiovascular status: blood pressure returned to baseline and stable ?Postop Assessment: no apparent nausea or vomiting ?Anesthetic complications: no ? ? ?No notable events documented. ? ?Last Vitals:  ?Vitals:  ? 05/14/21 0900 05/14/21 0915  ?BP: 107/64 130/77  ?Pulse: 87 84  ?Resp: (!) 23 (!) 24  ?Temp:  (!) 36.1 ?C  ?SpO2: 97% 95%  ?  ?Last Pain:  ?Vitals:  ? 05/14/21 0915  ?TempSrc:   ?PainSc: 0-No pain  ? ? ?  ?  ?  ?  ?  ?  ? ?Lynda Rainwater ? ? ? ? ?

## 2021-05-14 NOTE — Anesthesia Preprocedure Evaluation (Signed)
Anesthesia Evaluation  ?Patient identified by MRN, date of birth, ID band ?Patient awake ? ? ? ?Reviewed: ?Allergy & Precautions, NPO status , Patient's Chart, lab work & pertinent test results ? ?Airway ?Mallampati: II ? ?TM Distance: >3 FB ?Neck ROM: Full ? ? ? Dental ?no notable dental hx. ? ?  ?Pulmonary ?neg pulmonary ROS, Current Smoker and Patient abstained from smoking.,  ?  ?Pulmonary exam normal ?breath sounds clear to auscultation ? ? ? ? ? ? Cardiovascular ?hypertension, Pt. on medications ?negative cardio ROS ?Normal cardiovascular exam ?Rhythm:Regular Rate:Normal ? ? ?  ?Neuro/Psych ?negative neurological ROS ? negative psych ROS  ? GI/Hepatic ?negative GI ROS, Neg liver ROS,   ?Endo/Other  ?diabetes, Type 2Hyperthyroidism  ? Renal/GU ?negative Renal ROS  ?negative genitourinary ?  ?Musculoskeletal ?negative musculoskeletal ROS ?(+)  ? Abdominal ?  ?Peds ?negative pediatric ROS ?(+)  Hematology ?negative hematology ROS ?(+)   ?Anesthesia Other Findings ? ? Reproductive/Obstetrics ?negative OB ROS ? ?  ? ? ? ? ? ? ? ? ? ? ? ? ? ?  ?  ? ? ? ? ? ? ? ? ?Anesthesia Physical ?Anesthesia Plan ? ?ASA: 3 ? ?Anesthesia Plan: General  ? ?Post-op Pain Management:   ? ?Induction: Intravenous ? ?PONV Risk Score and Plan: 2 and Ondansetron, Midazolam and Treatment may vary due to age or medical condition ? ?Airway Management Planned: Nasal ETT ? ?Additional Equipment:  ? ?Intra-op Plan:  ? ?Post-operative Plan: Extubation in OR ? ?Informed Consent: I have reviewed the patients History and Physical, chart, labs and discussed the procedure including the risks, benefits and alternatives for the proposed anesthesia with the patient or authorized representative who has indicated his/her understanding and acceptance.  ? ? ? ?Dental advisory given ? ?Plan Discussed with: CRNA ? ?Anesthesia Plan Comments:   ? ? ? ? ? ? ?Anesthesia Quick Evaluation ? ?

## 2021-05-14 NOTE — H&P (Signed)
H&P documentation  -History and Physical Reviewed  -Patient has been re-examined  -No change in the plan of care  Brittney Burgess  

## 2021-05-14 NOTE — Anesthesia Procedure Notes (Signed)
Procedure Name: Intubation ?Date/Time: 05/14/2021 7:38 AM ?Performed by: Lorie Phenix, CRNA ?Pre-anesthesia Checklist: Patient identified, Emergency Drugs available, Suction available and Patient being monitored ?Patient Re-evaluated:Patient Re-evaluated prior to induction ?Oxygen Delivery Method: Circle system utilized ?Preoxygenation: Pre-oxygenation with 100% oxygen ?Induction Type: IV induction ?Ventilation: Mask ventilation without difficulty ?Laryngoscope Size: Mac and 3 ?Grade View: Grade II ?Nasal Tubes: Left, Nasal prep performed, Nasal Rae and Magill forceps- large, utilized ?Tube size: 7.0 mm ?Number of attempts: 1 ?Placement Confirmation: ETT inserted through vocal cords under direct vision, positive ETCO2 and breath sounds checked- equal and bilateral ?Secured at: 26 cm ?Tube secured with: Tape ?Dental Injury: Teeth and Oropharynx as per pre-operative assessment  ? ? ? ? ?

## 2021-05-14 NOTE — Transfer of Care (Signed)
Immediate Anesthesia Transfer of Care Note ? ?Patient: Brittney Burgess ? ?Procedure(s) Performed: DENTAL RESTORATION/EXTRACTIONS (Mouth) ? ?Patient Location: PACU ? ?Anesthesia Type:General ? ?Level of Consciousness: drowsy ? ?Airway & Oxygen Therapy: Patient Spontanous Breathing and Patient connected to face mask oxygen ? ?Post-op Assessment: Report given to RN and Post -op Vital signs reviewed and stable ? ?Post vital signs: Reviewed and stable ? ?Last Vitals:  ?Vitals Value Taken Time  ?  05/14/21 0849  ?Temp 36.1 ?C 05/14/21 0845  ?Pulse 81 05/14/21 0849  ?Resp 23 05/14/21 0849  ?SpO2 100 % 05/14/21 0849  ?Vitals shown include unvalidated device data. ? ?Last Pain:  ?Vitals:  ? 05/14/21 0845  ?TempSrc:   ?PainSc: Asleep  ?   ? ?  ? ?Complications: No notable events documented. ?

## 2021-05-15 ENCOUNTER — Encounter (HOSPITAL_COMMUNITY): Payer: Self-pay | Admitting: Oral Surgery

## 2021-05-26 ENCOUNTER — Other Ambulatory Visit: Payer: Self-pay

## 2021-05-26 DIAGNOSIS — G620 Drug-induced polyneuropathy: Secondary | ICD-10-CM

## 2021-05-26 DIAGNOSIS — T451X5A Adverse effect of antineoplastic and immunosuppressive drugs, initial encounter: Secondary | ICD-10-CM

## 2021-05-26 MED ORDER — OXYCODONE HCL 10 MG PO TABS: 10.0000 mg | ORAL_TABLET | ORAL | 0 refills | Status: DC | PRN

## 2021-05-29 ENCOUNTER — Other Ambulatory Visit: Payer: Self-pay | Admitting: Hematology and Oncology

## 2021-05-29 DIAGNOSIS — R600 Localized edema: Secondary | ICD-10-CM

## 2021-06-12 ENCOUNTER — Other Ambulatory Visit: Payer: Self-pay | Admitting: Hematology and Oncology

## 2021-06-12 DIAGNOSIS — E876 Hypokalemia: Secondary | ICD-10-CM

## 2021-06-14 ENCOUNTER — Encounter: Payer: Self-pay | Admitting: Hematology and Oncology

## 2021-06-18 ENCOUNTER — Other Ambulatory Visit: Payer: Self-pay

## 2021-06-18 ENCOUNTER — Other Ambulatory Visit: Payer: Self-pay | Admitting: Hematology and Oncology

## 2021-06-18 DIAGNOSIS — C50512 Malignant neoplasm of lower-outer quadrant of left female breast: Secondary | ICD-10-CM

## 2021-06-18 DIAGNOSIS — G620 Drug-induced polyneuropathy: Secondary | ICD-10-CM

## 2021-06-19 ENCOUNTER — Encounter: Payer: Self-pay | Admitting: Hematology and Oncology

## 2021-06-19 MED ORDER — OXYCODONE HCL 10 MG PO TABS: 10.0000 mg | ORAL_TABLET | Freq: Four times a day (QID) | ORAL | 0 refills | Status: DC | PRN

## 2021-06-21 ENCOUNTER — Encounter: Payer: Self-pay | Admitting: Hematology and Oncology

## 2021-06-21 ENCOUNTER — Telehealth: Payer: Self-pay

## 2021-06-21 ENCOUNTER — Other Ambulatory Visit: Payer: Self-pay

## 2021-06-21 DIAGNOSIS — G620 Drug-induced polyneuropathy: Secondary | ICD-10-CM

## 2021-06-21 NOTE — Telephone Encounter (Signed)
Referral faxed to [facility] neurology in [place]

## 2021-06-21 NOTE — Telephone Encounter (Signed)
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 If you have questions, Melissa and Amy are aware of the situation. Thanks!

## 2021-06-21 NOTE — Telephone Encounter (Addendum)
Pt notified of below and verbalized understanding. ----- Message from Marvia Pickles, PA-C sent at 06/19/2021  9:47 AM EDT ----- Will you let her know we are working on a neuro referral. I will refill her pain medication, but only for 1 tab every 6 hours for now. Thanks! ----- Message ----- From: [name], NP Sent: 06/18/2021   4:45 PM EDT To: Derwood Kaplan, MD, Thalia Bloodgood Mosher, PA-C  I would lean towards neuro. I can send in referral.  ----- Message ----- From: Derwood Kaplan, MD Sent: 06/18/2021   4:25 PM EDT To: [name], NP, Marvia Pickles, PA-C  Melissa, do you have any ideas?  She is on a lot of oxycodone for neuropathy and finished chemo a while ago.  Should we refer to Neuro? Pain management?   Opioids don't do that well for neuropathy ----- Message ----- From: Marvia Pickles, PA-C Sent: 06/18/2021  10:06 AM EDT To: Derwood Kaplan, MD  Patient is having to escalate dose of oxycodone for peripheral neuropathy in addition to gabapentin 1200 mg tid. Should be go to long acting narcotic? Change gabapentin?

## 2021-06-23 ENCOUNTER — Other Ambulatory Visit: Payer: Self-pay | Admitting: Hematology and Oncology

## 2021-06-23 ENCOUNTER — Encounter: Payer: Self-pay | Admitting: Neurology

## 2021-06-23 MED ORDER — OXYCODONE HCL 5 MG PO TABS: 10.0000 mg | ORAL_TABLET | ORAL | 0 refills | Status: DC | PRN

## 2021-06-23 MED ORDER — OXYCODONE HCL 5 MG PO TABS: 5.0000 mg | ORAL_TABLET | ORAL | 0 refills | Status: DC | PRN

## 2021-06-23 MED ORDER — OXYCODONE HCL 5 MG PO TABS: 10.0000 mg | ORAL_TABLET | Freq: Four times a day (QID) | ORAL | 0 refills | Status: DC | PRN

## 2021-07-11 ENCOUNTER — Other Ambulatory Visit: Payer: Self-pay | Admitting: Oncology

## 2021-07-11 DIAGNOSIS — R600 Localized edema: Secondary | ICD-10-CM

## 2021-07-14 NOTE — Progress Notes (Signed)
Brittney Burgess  [address] 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  She was treated with lumpectomy she completed chemotherapy at the end of April 2022, followed by radiation therapy completed in June.  She completed maintenance tastuzumab/pertuzumab for a total of 1 year of HER2 targeted therapy in December 2022.  She remains without evidence of recurrence.   She has her mammogram and sees Dr. Lilia Pro in September.  We will plan to see her back in 4 months for reexamination.  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 She has persistent numbness and pain of the bilateral lower extremities, left greater than right.  We had increased her gabapentin to 600 mg 3 times daily.  She is only taking gabapentin 600 mg in the morning and 300 mg in the afternoon.  I advised her it is fine to take 600 mg 3 times a day.  She has been taking oxycodone 10 mg, 2-3 times a day, but states this has been making her woozy, although it eases her pain.  I recommended she take 5 mg at a time instead.  She is scheduled to see the neurologist in August.    We will plan to see her back in 4 months for reexamination.  The patient understands the plans discussed today and is in agreement with them.  She knows to contact our office if she develops concerns prior to her next appointment.   I provided 30 minutes of face-to-face time during this encounter and > 50% was spent counseling as documented under my assessment and plan.    Marvia Pickles, PA-C  [facility] AT [facility] [address] [place] [address] Dept: [phone] Dept Fax: [phone]   Orders Placed This Encounter  Procedures   CBC and differential    This external order was created through the Results Console.   CBC    This external order was created through the Results Console.   Basic metabolic panel    This external order was created through the Results Console.   Comprehensive metabolic panel    This external order was created through the Results Console.   Hepatic function panel    This external order was created through the Results Console.   CBC    This order was created through External Result Entry      CHIEF COMPLAINT:  CC: Stage IA HER2 receptor positive left breast cancer  Current Treatment: Observation  HISTORY OF PRESENT ILLNESS:   Oncology History  Malignant neoplasm of lower-outer quadrant of left female breast ([place])  09/09/2019 Mammogram   SCREENING BILATERAL MAMMOGRAM: Further evaluation is suggested for possible mass with calcifications and possible dilated ducts in the left breast.   10/01/2019 Mammogram   DIAGNOSTIC UNILATERAL LEFT MAMMOGRAM AND LEFT BREAST ULTRASOUND: A 1.6 cm elongated mass containing pleomorphic calcifications in the 7 o'clock position of the left breast, highly suspicious for breast malignancy.  Tissue sampling is recommended.  A 5 mm hypoechoic, indeterminate mass containing 2 benign-appearing calcificaitons in the 2 o'clock position of the left breast.  Several retroareolar ecstatic ducts containing internal debris or possibly tissue.   10/21/2019 Pathology Results   1.  Breast, left, needle core biopsy, 5 o'clock, 5 cmfn:  -  Invasive ductal carcinoma, grade 3, and high grade ductal carcinoma in situ with necrosis.  -  Tumor involves multiple cores and measures 5 mm in maximum extent in a single core 2.  Breast, left, needle core biopsy, 2 o'clock:  -  Fibrocystic changes, including sclerosing  adenosis with calcifications  -  No atypia, in situ or invasive malignancy identified 3.  Breast, left, needle core biopsy, 11:30 o'clock:  -  Fragmented intraductal papilloma and dilated duct  -  No atypia, in situ or invasive malignancy identified  HER2: Positive  Ration of HER2/CEN17 signals: 2.23  Average HER2 copy #/cell: 4.23 ER: Negative 0% PR: Negative 0% Ki67: 50%   11/13/2019 Breast MRI   BILATERAL BREAST MRI WITH AND WITHOUT CONTRAST: 1.  Question abnormal lymph node in the left axilla.   2, 1.5 cm enhancement in the left breast approximately 6 o'clock position with associated biopsy clip consistent with recent biopsy cancer.  3. 0.6 cm masslike enhancement in the retroareolar left breast with associated biopsy clip artifact correlating to recently biopsy proven papilloma.   12/08/2019 Initial Diagnosis   Breast cancer ([place])   12/23/2019 Pathology Results   1.  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  Lymph node, sentinel, biopsy, left:             -  One benign, reactive lymph node with focal biopsy site reaction (0/1)  Tumor size: 1.2 x 1.0 x 0.8 cm Histologic grade: 3   01/01/2020 Cancer Staging   Staging form: Breast, AJCC 8th Edition - Clinical stage from 01/01/2020: Stage IA (cT1c, cN0(sn), cM0, G3, ER-, PR-, HER2+) - Signed by Derwood Kaplan, MD on 01/09/2020   01/29/2020 - 01/07/2021 Chemotherapy   Patient is on Treatment Plan : BREAST  Docetaxel + Carboplatin + Trastuzumab + Pertuzumab  (TCHP) q21d (COMPLETED TCHP ON 05/20/2020) / Trastuzumab + Pertuzumab q21d      06/15/2020 - 07/21/2020 Radiation Therapy   5000 cGy in 25 fractions    10/14/2020 Mammogram   DIAGNOSTIC BILATERAL MAMMOGRAM: No  evidence of malignancy in either breast.    10/29/2020 Echocardiogram   Overall left ventricular systolic function is normal with, an EF between 55 - 60 %.        INTERVAL HISTORY:  Brittney Burgess is here today for repeat clinical assessment.  She denies any changes in her breasts.  She has persistent neuropathy with both numbness and sharp shooting pain of the bilateral lower extremities radiating up from the feet.  She reports the pain in her left leg is worse than the right and rates this 5 out of 10 today.  She has difficulty ambulating due to the numbness.  She is in a wheelchair today as she does  not trust herself walking long distances.  She uses a walker at home.  She states she is scheduled to see the neurologist in August.  She is only taking gabapentin 600 mg in the morning and 300 mg in the afternoon.  She is using oxycodone 10 mg 2-3 times daily, which she states eases her pain.  She states she feels woozy with the 10 mg dose.  She denies any new pain.  She denies fevers or chills.  Her appetite is good. Her weight has decreased 4 pounds over last 3 months .  She continues to follow with Dr. Lilia Pro and is scheduled to see him after her mammogram in September.  REVIEW OF SYSTEMS:  Review of Systems  Constitutional:  Negative for appetite change, chills, fatigue, fever and unexpected weight change.  HENT:   Negative for lump/mass, mouth sores and sore throat.   Respiratory:  Negative for cough and shortness of breath.   Cardiovascular:  Negative for chest pain and leg swelling.  Gastrointestinal:  Positive for nausea (Occasional, without vomiting). Negative for abdominal pain, constipation, diarrhea and vomiting.  Endocrine: Negative for hot flashes.  Genitourinary:  Negative for difficulty urinating, dysuria, frequency and hematuria.   Musculoskeletal:  Positive for gait problem (Due to numbness of the feet). Negative for arthralgias, back pain and myalgias.  Skin:  Negative for rash.   Neurological:  Positive for gait problem (Due to numbness of the feet) and numbness (Bilateral legs). Negative for dizziness and headaches.  Hematological:  Negative for adenopathy. Does not bruise/bleed easily.  Psychiatric/Behavioral:  Negative for depression and sleep disturbance. The patient is not nervous/anxious.      VITALS:  Blood pressure (!) 158/82, pulse 84, temperature 97.9 F (36.6 C), temperature source Oral, resp. rate 18, height '5\' 9"'  (1.753 m), weight 181 lb 4.8 oz (82.2 kg), SpO2 99 %.  Wt Readings from Last 3 Encounters:  07/20/21 181 lb 4.8 oz (82.2 kg)  05/14/21 185 lb (83.9 kg)  04/19/21 187 lb (84.8 kg)    Body mass index is 26.77 kg/m.  Performance status (ECOG): 2 - Symptomatic, <50% confined to bed  PHYSICAL EXAM:  Physical Exam Vitals and nursing note reviewed.  Constitutional:      General: She is not in acute distress.    Appearance: Normal appearance.  HENT:     Head: Normocephalic and atraumatic.     Mouth/Throat:     Mouth: Mucous membranes are moist.     Pharynx: Oropharynx is clear. No oropharyngeal exudate or posterior oropharyngeal erythema.  Eyes:     General: No scleral icterus.    Extraocular Movements: Extraocular movements intact.     Conjunctiva/sclera: Conjunctivae normal.     Pupils: Pupils are equal, round, and reactive to light.  Cardiovascular:     Rate and Rhythm: Normal rate and regular rhythm.     Heart sounds: Normal heart sounds. No murmur heard.    No friction rub. No gallop.  Pulmonary:     Effort: Pulmonary effort is normal.     Breath sounds: Normal breath sounds. No wheezing, rhonchi or rales.  Chest:  Breasts:    Right: Skin change (Healing sore on the right lateral breast with overlying eschar, no evidence of infection) present. No swelling, bleeding, inverted nipple, mass, nipple discharge or tenderness.     Left: Normal. No swelling, bleeding, inverted nipple, mass, nipple discharge, skin change or tenderness.   Abdominal:     General: There is no distension.  Palpations: Abdomen is soft. There is no hepatomegaly, splenomegaly or mass.     Tenderness: There is no abdominal tenderness.  Musculoskeletal:        General: Normal range of motion.     Cervical back: Normal range of motion and neck supple. No tenderness.     Right lower leg: No edema.     Left lower leg: No edema.  Lymphadenopathy:     Cervical: No cervical adenopathy.     Upper Body:     Right upper body: No supraclavicular or axillary adenopathy.     Left upper body: No supraclavicular or axillary adenopathy.     Lower Body: No right inguinal adenopathy. No left inguinal adenopathy.  Skin:    General: Skin is warm and dry.     Coloration: Skin is not jaundiced.     Findings: No rash.  Neurological:     Mental Status: She is alert and oriented to person, place, and time.     Cranial Nerves: No cranial nerve deficit.     Sensory: Sensory deficit (Decreased bilateral feet and legs below the knee) present.     Motor: Motor function is intact.  Psychiatric:        Mood and Affect: Mood normal.        Behavior: Behavior normal.        Thought Content: Thought content normal.     LABS:      Latest Ref Rng & Units 07/20/2021   12:00 AM 04/19/2021   12:00 AM 02/19/2021   12:00 AM  CBC  WBC  7.6     8.0     10.2    10.2      Hemoglobin 12.0 - 16.0 13.5     12.7     13.6   Hematocrit 36 - 46 41     39     41   Platelets 150 - 400 K/uL 190     220     269      This result is from an external source.   Multiple values from one day are sorted in reverse-chronological order      Latest Ref Rng & Units 07/20/2021   12:00 AM 04/19/2021   12:00 AM 02/19/2021   12:00 AM  CMP  BUN 4 - '21 12     15     21   ' Creatinine 0.5 - 1.1 1.0     1.0     1.2   Sodium 137 - 147 141     138     139   Potassium 3.5 - 5.1 mEq/L 3.6     3.3     4.0   Chloride 99 - 108 105     104     108   CO2 13 - '22 25     27     23   ' Calcium 8.7 - 10.7  9.6     9.6     10.0   Alkaline Phos 25 - 125 119     129     147   AST 13 - 35 '21     22     25   ' ALT 7 - 35 U/L '16     21     22      ' This result is from an external source.     No results found for: "CEA1", "CEA" / No results found for: "CEA1", "CEA" No results found for: "  PSA1" No results found for: "OHC091" No results found for: "CAN125"  No results found for: "TOTALPROTELP", "ALBUMINELP", "A1GS", "A2GS", "BETS", "BETA2SER", "GAMS", "MSPIKE", "SPEI" No results found for: "TIBC", "FERRITIN", "IRONPCTSAT" No results found for: "LDH"  STUDIES:  No results found.    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   Hypertension    Neuromuscular disorder ([place])    neuropathy bilateral feet and lower legs   Port-A-Cath in place    right upper chest   Walker as ambulation aid     Past Surgical History:  Procedure Laterality Date   BIOPSY THYROID  04/2019   2.9 cm mass, benign   BREAST LUMPECTOMY Left 12/25/2019   HYSTERECTOMY ABDOMINAL WITH SALPINGO-OOPHORECTOMY Bilateral 2004   due to menorrhagia   MYOMECTOMY  1995   x6 for fibroids   TOOTH EXTRACTION N/A 05/14/2021   Procedure: DENTAL RESTORATION/EXTRACTIONS;  Surgeon: Diona Browner, DMD;  Location: [facility];  Service: Oral Surgery;  Laterality: N/A;    Family History  Problem Relation Age of Onset   Cancer Mother    Cancer Maternal Aunt     Social History:  reports that she has been smoking cigarettes. She has a 32.00 pack-year smoking history. She has never used smokeless tobacco. She reports that she does not drink alcohol and does not use drugs.The patient is alone today.  Allergies:  Allergies  Allergen Reactions   Metformin And Related Other (See Comments)    Twitching, nausea    Current Medications: Current Outpatient Medications   Medication Sig Dispense Refill   KLOR-CON M20 20 MEQ tablet TAKE 1 TABLET BY MOUTH TWICE A DAY 60 tablet 1   amLODipine (NORVASC) 5 MG tablet Take 1 tablet (5 mg total) by mouth daily. 30 tablet 0   Blood Glucose Monitoring Suppl (TRUE METRIX METER) DEVI dx 11.9 (Medicaid preferred) USE FOR TESTING BLOOD SUGARS when Fasting daily     gabapentin (NEURONTIN) 300 MG capsule Take 2 capsules (600 mg total) by mouth 3 (three) times daily. (Patient taking differently: Take 300 mg by mouth 3 (three) times daily.) 180 capsule 5   hydrochlorothiazide (HYDRODIURIL) 25 MG tablet TAKE 1 TABLET (25 MG TOTAL) BY MOUTH DAILY. 90 tablet 1   insulin glargine (LANTUS) 100 UNIT/ML Solostar Pen Inject 10-40 Units into the skin See admin instructions. Take 10 units in the morning and 40 units at bedtime     JANUVIA 100 MG tablet Take 100 mg by mouth daily.     losartan (COZAAR) 50 MG tablet TAKE 1 TABLET BY MOUTH EVERY DAY 30 tablet 2   oxyCODONE (OXY IR/ROXICODONE) 5 MG immediate release tablet Take 1 tablet (5 mg total) by mouth every 6 (six) hours as needed for severe pain. 120 tablet 0   TECHLITE PEN NEEDLES 32G X 6 MM MISC 5/32 size?     TRUE METRIX BLOOD GLUCOSE TEST test strip SMARTSIG:1 Via Meter Daily PRN     TRUEplus Lancets 28G MISC SMARTSIG:1 Topical Daily PRN     No current facility-administered medications for this visit.

## 2021-07-14 NOTE — Assessment & Plan Note (Addendum)
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  She was treated with lumpectomy she completed chemotherapy at the end of April 2022, followed by radiation therapy completed in June.  She completedmaintenance tastuzumab/pertuzumab for a total of 1 year of HER2 targeted therapyin December 2022.  She remains without evidence of recurrence.   She has her mammogram and sees Dr. Lilia Pro in September.  We will plan to see her back in 4 months for reexamination.

## 2021-07-14 NOTE — Assessment & Plan Note (Addendum)
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 She has persistent numbness and pain of the bilateral lower extremities, left greater than right.  We had increased her gabapentin to 600 mg 3 times daily.  She is only taking gabapentin600 mg in the morning and 300 mg in the afternoon.  I advised her it is fine to take 600 mg 3 times a day.  She has been taking oxycodone10 mg, 2-3 times a day, but states this has been making her woozy, although it eases her pain.  I recommended she take 5 mg at a time instead.  She is scheduled to see the neurologist in August.

## 2021-07-17 ENCOUNTER — Other Ambulatory Visit: Payer: Self-pay | Admitting: Hematology and Oncology

## 2021-07-17 DIAGNOSIS — E876 Hypokalemia: Secondary | ICD-10-CM

## 2021-07-17 DIAGNOSIS — E0590 Thyrotoxicosis, unspecified without thyrotoxic crisis or storm: Secondary | ICD-10-CM

## 2021-07-19 ENCOUNTER — Other Ambulatory Visit: Payer: Self-pay

## 2021-07-19 ENCOUNTER — Other Ambulatory Visit: Payer: Self-pay | Admitting: Hematology and Oncology

## 2021-07-19 DIAGNOSIS — Z171 Estrogen receptor negative status [ER-]: Secondary | ICD-10-CM

## 2021-07-20 ENCOUNTER — Telehealth: Payer: Self-pay

## 2021-07-20 ENCOUNTER — Encounter: Payer: Self-pay | Admitting: Hematology and Oncology

## 2021-07-20 ENCOUNTER — Inpatient Hospital Stay: Payer: Medicaid Other | Attending: Hematology and Oncology | Admitting: Hematology and Oncology

## 2021-07-20 ENCOUNTER — Telehealth: Payer: Self-pay | Admitting: Hematology and Oncology

## 2021-07-20 ENCOUNTER — Inpatient Hospital Stay: Payer: Medicaid Other

## 2021-07-20 DIAGNOSIS — C50512 Malignant neoplasm of lower-outer quadrant of left female breast: Secondary | ICD-10-CM | POA: Diagnosis not present

## 2021-07-20 DIAGNOSIS — Z171 Estrogen receptor negative status [ER-]: Secondary | ICD-10-CM

## 2021-07-20 DIAGNOSIS — T451X5A Adverse effect of antineoplastic and immunosuppressive drugs, initial encounter: Secondary | ICD-10-CM

## 2021-07-20 DIAGNOSIS — G620 Drug-induced polyneuropathy: Secondary | ICD-10-CM | POA: Diagnosis not present

## 2021-07-20 LAB — CBC
MCV: 85 (ref 81–99)
RBC: 4.77 (ref 3.87–5.11)

## 2021-07-20 LAB — CBC AND DIFFERENTIAL
HCT: 41 (ref 36–46)
Hemoglobin: 13.5 (ref 12.0–16.0)
Neutrophils Absolute: 5.62
Platelets: 190 10*3/uL (ref 150–400)
WBC: 7.6

## 2021-07-20 LAB — HEPATIC FUNCTION PANEL
ALT: 16 U/L (ref 7–35)
AST: 21 (ref 13–35)
Alkaline Phosphatase: 119 (ref 25–125)
Bilirubin, Total: 0.6

## 2021-07-20 LAB — BASIC METABOLIC PANEL
BUN: 12 (ref 4–21)
CO2: 25 — AB (ref 13–22)
Chloride: 105 (ref 99–108)
Creatinine: 1 (ref 0.5–1.1)
Glucose: 131
Potassium: 3.6 mEq/L (ref 3.5–5.1)
Sodium: 141 (ref 137–147)

## 2021-07-20 LAB — COMPREHENSIVE METABOLIC PANEL
Albumin: 4.4 (ref 3.5–5.0)
Calcium: 9.6 (ref 8.7–10.7)

## 2021-07-20 MED ORDER — OXYCODONE HCL 5 MG PO TABS: 5.0000 mg | ORAL_TABLET | Freq: Four times a day (QID) | ORAL | 0 refills | Status: DC | PRN

## 2021-07-20 NOTE — Telephone Encounter (Signed)
07/20/21 next appt scheduled and confirmed with patient

## 2021-07-20 NOTE — Telephone Encounter (Signed)
Patient called regaurding recent test results , Rosanne S[facility] voiced chem 7 normal other than elevated blood glucose of 131 patient made aware.

## 2021-07-21 ENCOUNTER — Other Ambulatory Visit: Payer: Self-pay | Admitting: Hematology and Oncology

## 2021-07-21 ENCOUNTER — Telehealth: Payer: Self-pay

## 2021-07-21 ENCOUNTER — Encounter: Payer: Self-pay | Admitting: Hematology and Oncology

## 2021-07-21 DIAGNOSIS — G620 Drug-induced polyneuropathy: Secondary | ICD-10-CM

## 2021-07-21 MED ORDER — GABAPENTIN 300 MG PO CAPS: 600.0000 mg | ORAL_CAPSULE | Freq: Three times a day (TID) | ORAL | 5 refills | Status: DC

## 2021-07-21 NOTE — Telephone Encounter (Signed)
-----   Message from Marvia Pickles, PA-C sent at 07/21/2021 12:20 PM EDT ----- Regarding: RE: gabapentin Done ----- Message ----- From: Georgette Shell, RN Sent: 07/21/2021  11:55 AM EDT To: Marvia Pickles, PA-C Subject: gabapentin                                     Called for refill on gabapentin '300mg'$  TID.  Sent to [place].

## 2021-07-26 ENCOUNTER — Encounter: Payer: Self-pay | Admitting: Hematology and Oncology

## 2021-08-24 ENCOUNTER — Other Ambulatory Visit: Payer: Self-pay | Admitting: Hematology and Oncology

## 2021-08-24 MED ORDER — OXYCODONE HCL 5 MG PO TABS: 5.0000 mg | ORAL_TABLET | Freq: Four times a day (QID) | ORAL | 0 refills | Status: DC | PRN

## 2021-08-28 ENCOUNTER — Other Ambulatory Visit: Payer: Self-pay | Admitting: Hematology and Oncology

## 2021-08-28 DIAGNOSIS — E876 Hypokalemia: Secondary | ICD-10-CM

## 2021-08-31 ENCOUNTER — Encounter: Payer: Self-pay | Admitting: Hematology and Oncology

## 2021-09-22 ENCOUNTER — Other Ambulatory Visit: Payer: Self-pay

## 2021-09-22 DIAGNOSIS — T451X5A Adverse effect of antineoplastic and immunosuppressive drugs, initial encounter: Secondary | ICD-10-CM

## 2021-09-22 MED ORDER — OXYCODONE HCL 5 MG PO TABS: 5.0000 mg | ORAL_TABLET | Freq: Four times a day (QID) | ORAL | 0 refills | Status: DC | PRN

## 2021-09-28 ENCOUNTER — Other Ambulatory Visit: Payer: Self-pay | Admitting: Hematology and Oncology

## 2021-09-28 ENCOUNTER — Telehealth: Payer: Self-pay

## 2021-09-28 DIAGNOSIS — E876 Hypokalemia: Secondary | ICD-10-CM

## 2021-09-28 NOTE — Telephone Encounter (Signed)
[name] C,RN: She states she went to Dr Carolynne Edouard on Monday. He drew labs. I told her to call him if she needs potassium. She verbalized understanding.  Kelli Mosher,PA- Hi, she was supposed to establish with PCP to handle potassium, etc. Has she done that yet? I will only send in 1 more month supply, she really needs PCP. Thanks

## 2021-09-28 NOTE — Progress Notes (Unsigned)
Initial neurology clinic note  SERVICE DATE: 09/30/21 SERVICE TIME: 9:30 am  Reason for Evaluation: Consultation requested by [name], NP for an opinion regarding neuropathy. My final recommendations will be communicated back to the requesting physician by way of shared medical record or letter to requesting physician via [place] mail.  HPI: This is [name], a 61 y.o. right-handed female with a medical history of breast cancer s/p lumpectomy and sentinel lymph node biopsy, chemotherapy with docetaxel, carboplatin, trastuzumab, and pertuzumab, HTN, DM, current smoker who presents to neurology clinic with the chief complaint of numbness and tingling in the legs and hands. The patient is alone today.  She would find that is was not walking straight and that her face was drooping. She was taken to the ED worried she was having a stroke. They thought this was caused by prior thyroid problems. Her symptoms improved. She is not sure why, but they did a mammogram and found to have cancer. She underwent surgery and had chemo. After chemo started she started having numbness and tingling in her bilateral legs (left more than right). She then started having numbness and tingling in bilateral hands. She has difficulty holding on to things. She started gabapentin soon after (about 2 years ago). She is currently taking gabapentin 600 mg in am and 300 mg in pm. It is helping a little. She then started noticing muscle jerking. She also thinks her balance has gotten worse since starting the gabapentin. She uses a walker to get around, or needs a wheelchair for bigger distances. She has used the walker for about 1 year.  Overall, the neuropathy has improved a little. She has only been on gabapentin for neuropathy and has not done physical therapy.  She endorses dysarthria since her breast cancer surgery. Her speech got a little worse after she had her teeth removed. She currently thinks her speech is  off due to her new teeth.  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 -Completed chemotherapy with docetaxel, carboplatin, trastuzumab, and pertuzumab on 05/2020. Doxetaxel was discontinued with her 5th cycle due to neuropathy. -Radiation therapy completed in 07/2020 (5000 cGy in 25 fractions) -Maintenance tastuzumab/pertuzumab until 12/2020 -Numbness has persisted and worsened. Gabapentin started and increased to 1200 mg TID with only mild improvement. Oxycodone 10 mg PRN has also been given.   EtOH use: No  Restrictive diet? No Family history of neuropathy/myopathy/NM disease? No. Cousin has MS.  She has never had an EMG.   MEDICATIONS:  Outpatient Encounter Medications as of 09/30/2021  Medication Sig   amLODipine (NORVASC) 5 MG tablet Take 1 tablet (5 mg total) by mouth daily.   Blood Glucose Monitoring Suppl (TRUE METRIX METER) DEVI dx 11.9 (Medicaid preferred) USE FOR TESTING BLOOD SUGARS when Fasting daily   gabapentin (NEURONTIN) 300 MG capsule Take 2 capsules (600 mg total) by mouth 3 (three) times daily.   hydrochlorothiazide (HYDRODIURIL) 25 MG tablet TAKE 1 TABLET (25 MG TOTAL) BY MOUTH DAILY.   JANUVIA 100 MG tablet Take 100 mg by mouth daily.   KLOR-CON M20 20 MEQ tablet TAKE 1 TABLET BY MOUTH TWICE A DAY   losartan (COZAAR) 50 MG tablet TAKE 1 TABLET BY MOUTH EVERY DAY   ondansetron (ZOFRAN) 4 MG tablet Take 4 mg by mouth every 8 (eight) hours as needed for nausea or vomiting.   TECHLITE PEN NEEDLES  32G X 6 MM MISC 5/32 size?   TRUE METRIX BLOOD GLUCOSE TEST test strip SMARTSIG:1 Via Meter Daily PRN   TRUEplus Lancets 28G MISC SMARTSIG:1 Topical Daily PRN   insulin glargine (LANTUS) 100 UNIT/ML Solostar Pen Inject 10-40 Units into the skin See admin instructions. Take 10 units in the morning and 40 units at bedtime   oxyCODONE (OXY IR/ROXICODONE) 5 MG  immediate release tablet Take 1 tablet (5 mg total) by mouth every 6 (six) hours as needed for severe pain.   VICTOZA 18 MG/3ML SOPN Inject into the skin.   No facility-administered encounter medications on file as of 09/30/2021.    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   Hypertension    Neuromuscular disorder ([place])    neuropathy bilateral feet and lower legs   Port-A-Cath in place    right upper chest   Walker as ambulation aid     PAST SURGICAL HISTORY: Past Surgical History:  Procedure Laterality Date   BIOPSY THYROID  04/2019   2.9 cm mass, benign   BREAST LUMPECTOMY Left 12/25/2019   HYSTERECTOMY ABDOMINAL WITH SALPINGO-OOPHORECTOMY Bilateral 2004   due to menorrhagia   MYOMECTOMY  1995   x6 for fibroids   TOOTH EXTRACTION N/A 05/14/2021   Procedure: DENTAL RESTORATION/EXTRACTIONS;  Surgeon: Diona Browner, DMD;  Location: [facility];  Service: Oral Surgery;  Laterality: N/A;    ALLERGIES: Allergies  Allergen Reactions   Metformin And Related Other (See Comments)    Twitching, nausea    FAMILY HISTORY: Family History  Problem Relation Age of Onset   Cancer Mother    Alcohol abuse Father    Diabetes Brother    Cancer Maternal Aunt     SOCIAL HISTORY: Social History   Tobacco Use   Smoking status: Every Day    Packs/day: 1.00    Years: 32.00    Total pack years: 32.00    Types: Cigarettes   Smokeless tobacco: Never   Tobacco comments:    Patient is cutting down to 1 pack every 3-4 days as of 05/2021  Vaping Use   Vaping Use: Never used  Substance Use Topics   Alcohol use: Never   Drug use: Never   Social History   Social History Narrative   Right handed    Caffeine 1 cup daily   Live one story     OBJECTIVE: PHYSICAL EXAM: BP 122/75   Pulse 100   Ht 5'  9" (1.753 m)   Wt 187 lb (84.8 kg)   SpO2 95%   BMI 27.62 kg/m   General: General appearance: Awake and alert. No distress. Cooperative with exam.  Psych: Affect appropriate.  Neurological: Mental Status: Alert. Speech fluent. No pseudobulbar affect Cranial Nerves: CNII: No RAPD. Visual fields intact. CNIII, IV, VI: PERRL. No nystagmus. EOMI. CN V: Facial sensation intact bilaterally to fine touch. [place] clench strong. CN VII: Reduced nasolabial fold on left. No ptosis at rest.   CN VIII: Hears finger rub well bilaterally. CN IX: No hypophonia. CN X: Palate elevates symmetrically. CN XI: Full strength shoulder shrug bilaterally. CN XII: Tongue protrusion full and midline. No atrophy or fasciculations. Mild dysarthria Motor: Paratonia in bilateral lower extremities. Normal tone in upper extremities. No fasciculations in extremities.  No atrophy.  Individual muscle group testing (MRC grade out of 5):  Movement     Neck flexion 5    Neck extension 5     Right Left   Shoulder abduction 5 5   Shoulder adduction 5 5   Shoulder ext rotation 5 5   Shoulder int rotation 5 5   Elbow flexion 5 5   Elbow extension 5 5   Wrist extension 5 5   Wrist flexion 5 5   Finger abduction - FDI 5 5   Finger abduction - ADM 5 5   Finger extension 5 5   Finger distal flexion - 2/'3 5 5   ' Finger distal flexion - 4/'5 5 5   ' Thumb flexion - FPL 5 5   Thumb abduction - APB 5 5    Hip flexion 5 5-   Hip extension 5 5   Hip adduction 5 5   Hip abduction 5 5   Knee extension 5 5   Knee flexion 5 5   Dorsiflexion 5 5-   Plantarflexion 5 5     Reflexes:  Right Left   Bicep 2+ 2+   Tricep 1+ 1+   BrRad 1+ 1+   Knee 1+ 1+   Ankle 0 0    Pathological Reflexes: Babinski: Flexor response bilaterally Hoffman: Absent bilaterally Troemner: Absent bilaterally Sensation: Pinprick: Intact in all extremities Vibration: Intact in all extremities Temperature: Intact in all  extremities Proprioception: Intact in bilateral great toes Coordination: Intact finger-to- nose-finger bilaterally. Romberg positive with sway. Gait: Able to rise from chair with arms crossed unassisted but with some difficulty. Wide-based gait. Uses a walker to ambulate safely.  Lab and Test Review: Internal labs: CBC and CMP unremarkable  External labs: Normal or unremarkable: CMP, CBC (07/20/21)  ASSESSMENT: Brittney Burgess is a 61 y.o. female who presents for evaluation of numbness and tingling in legs and hands, gait imbalance, and muscle jerking. She has a relevant medical history of breast cancer s/p lumpectomy and sentinel lymph node biopsy, chemotherapy with docetaxel, carboplatin, trastuzumab, and pertuzumab, HTN, DM, current smoker. Her neurological examination is pertinent for facial asymmetry, possible left leg weakness, hyporeflexia, wide-based gait, and gait ataxia.  Overall, the numbness and tingling in legs and hands are consistent with chemotherapy-induced peripheral neuropathy. I will get an EMG to confirm and blood work to look for other treatable causes that may be contributing to her neuropathy. Her neuropathy is currently being treated with gabapentin, which may be the cause of her jerking (myoclonus) and perhaps contributing to ataxia. I would like to taper this and start Cymbalta, as Cymbalta has better evidence for chemotherapy induced peripheral neuropathy. She would also benefit from physical therapy.  In terms of her facial asymmetry, possible left sided weakness, and dysarthria, I want to ensure there is not a central process at play. Patient was concerned she had a stroke, but never had an MRI brain per her account. Given her history of cancer, there are other central etiologies that should be evaluated.  PLAN: -Blood work: B12, HbA1c, IFE, SPEP -EMG (LLE, PN protocol) -MRI brain w/wo contract -Physical therapy referral for gait abnormalities (patient prefers  someone close to [place]) -Start Cymbalta 30 mg at night for 1 week then 60 mg at night thereafter -Taper gabapentin as follows: 600 mg/300 mg  -300 mg/300 mg starting today for 1 week, then  -300 mg in the morning for 1 week, then stop  -Return to clinic in 3  months  The impression above as well as the plan as outlined below were extensively discussed with the patient who voiced understanding. All questions were answered to their satisfaction.  When available, results of the above investigations and possible further recommendations will be communicated to the patient via telephone/MyChart. Patient to call office if not contacted after expected testing turnaround time.   Total time spent reviewing records, interview, history/exam, documentation, and coordination of care on day of encounter:  65 min   Thank you for allowing me to participate in patient's care.  If I can answer any additional questions, I would be pleased to do so.  Kai Levins, MD   CC: Carolynne Edouard, MD [place] [id number]  CC: Referring provider: [name], NP [address] [place],  [place] [address]

## 2021-09-30 ENCOUNTER — Other Ambulatory Visit: Payer: Medicare Other

## 2021-09-30 ENCOUNTER — Encounter: Payer: Self-pay | Admitting: Hematology and Oncology

## 2021-09-30 ENCOUNTER — Encounter: Payer: Self-pay | Admitting: Neurology

## 2021-09-30 ENCOUNTER — Other Ambulatory Visit (INDEPENDENT_AMBULATORY_CARE_PROVIDER_SITE_OTHER): Payer: Medicare Other

## 2021-09-30 ENCOUNTER — Ambulatory Visit (INDEPENDENT_AMBULATORY_CARE_PROVIDER_SITE_OTHER): Payer: Medicare Other | Admitting: Neurology

## 2021-09-30 VITALS — BP 122/75 | HR 100 | Ht 69.0 in | Wt 187.0 lb

## 2021-09-30 DIAGNOSIS — R269 Unspecified abnormalities of gait and mobility: Secondary | ICD-10-CM

## 2021-09-30 DIAGNOSIS — R2689 Other abnormalities of gait and mobility: Secondary | ICD-10-CM | POA: Diagnosis not present

## 2021-09-30 DIAGNOSIS — E1142 Type 2 diabetes mellitus with diabetic polyneuropathy: Secondary | ICD-10-CM

## 2021-09-30 DIAGNOSIS — T451X5A Adverse effect of antineoplastic and immunosuppressive drugs, initial encounter: Secondary | ICD-10-CM

## 2021-09-30 DIAGNOSIS — R209 Unspecified disturbances of skin sensation: Secondary | ICD-10-CM

## 2021-09-30 DIAGNOSIS — R29810 Facial weakness: Secondary | ICD-10-CM

## 2021-09-30 DIAGNOSIS — G620 Drug-induced polyneuropathy: Secondary | ICD-10-CM

## 2021-09-30 DIAGNOSIS — Q670 Congenital facial asymmetry: Secondary | ICD-10-CM

## 2021-09-30 LAB — VITAMIN B12: Vitamin B-12: 301 pg/mL (ref 211–911)

## 2021-09-30 MED ORDER — DULOXETINE HCL 30 MG PO CPEP: ORAL_CAPSULE | ORAL | 0 refills | Status: DC

## 2021-09-30 MED ORDER — GABAPENTIN 300 MG PO CAPS: ORAL_CAPSULE | ORAL | 5 refills | Status: DC

## 2021-09-30 NOTE — Patient Instructions (Addendum)
Your neuropathy (numbness and tingling) is likely because of the chemotherapy you had to save your life from breast cancer. We need to make sure there is nothing else contributing to your symptoms though, like your diabetes. I would like to do the following:  -Blood work today -EMG - nerve test (see instructions below). You can schedule this today. -MRI brain because of left face and left sided weakness -Physical therapy referral (can be done in [place])  I would also like to change your neuropathy medications: -Start Cymbalta 1 tablet (30 mg) at night for 1 week then two tablets (60 mg) at night thereafter -Come down on your gabapentin as follows:   -1 tablet (300 mg) in the morning and 1 tablet (300 mg) at night starting today for 1 week, then  -Just 1 tablet in the morning (300 mg) for 1 week, then stop taking gabapentin  Follow up in 3 months or sooner if needed. I will be in touch when I have the results of your labs.  The physicians and staff at [facility] are committed to providing excellent care. You may receive a survey requesting feedback about your experience at our office. We strive to receive "very good" responses to the survey questions. If you feel that your experience would prevent you from giving the office a "very good " response, please contact our office to try to remedy the situation. We may be reached at [phone]. Thank you for taking the time out of your busy day to complete the survey.  Kai Levins, MD

## 2021-09-30 NOTE — Addendum Note (Signed)
Addended by: Renae Gloss on: 09/30/2021 04:37 PM   Modules accepted: Orders

## 2021-10-01 ENCOUNTER — Telehealth: Payer: Self-pay | Admitting: Neurology

## 2021-10-01 LAB — HEMOGLOBIN A1C
Hgb A1c MFr Bld: 10.4 % of total Hgb — ABNORMAL HIGH (ref <5.7)
Mean Plasma Glucose: 252 mg/dL
eAG (mmol/L): 13.9 mmol/L

## 2021-10-01 NOTE — Telephone Encounter (Signed)
Called patient to discuss lab results.  Her B12 is borderline low 301. Given her neuropathy symptoms, I recommended she supplement B12 1000 mcg daily.  I also let her know that her HbA1c was high at 10.4. She is going to see her PCP on 10/28/21 and will discuss if medication changes for DM are needed.  All questions were answered.  Kai Levins, MD

## 2021-10-08 LAB — IMMUNOFIXATION ELECTROPHORESIS
IgG (Immunoglobin G), Serum: 1691 mg/dL — ABNORMAL HIGH (ref 600–1640)
IgM, Serum: 54 mg/dL (ref 50–300)
Immunofix Electr Int: NOT DETECTED
Immunoglobulin A: 223 mg/dL (ref 47–310)

## 2021-10-08 LAB — PROTEIN ELECTROPHORESIS, SERUM
Albumin ELP: 4.7 g/dL (ref 3.8–4.8)
Alpha 1: 0.4 g/dL — ABNORMAL HIGH (ref 0.2–0.3)
Alpha 2: 0.7 g/dL (ref 0.5–0.9)
Beta 2: 0.4 g/dL (ref 0.2–0.5)
Beta Globulin: 0.5 g/dL (ref 0.4–0.6)
Gamma Globulin: 1.5 g/dL (ref 0.8–1.7)
Total Protein: 8.1 g/dL (ref 6.1–8.1)

## 2021-10-12 ENCOUNTER — Encounter: Payer: Self-pay | Admitting: Hematology and Oncology

## 2021-10-19 ENCOUNTER — Inpatient Hospital Stay: Admission: RE | Admit: 2021-10-19 | Payer: Medicare Other | Source: Ambulatory Visit

## 2021-11-01 ENCOUNTER — Other Ambulatory Visit: Payer: Self-pay

## 2021-11-01 DIAGNOSIS — G620 Drug-induced polyneuropathy: Secondary | ICD-10-CM

## 2021-11-01 MED ORDER — OXYCODONE HCL 5 MG PO TABS: 5.0000 mg | ORAL_TABLET | Freq: Four times a day (QID) | ORAL | 0 refills | Status: DC | PRN

## 2021-11-05 ENCOUNTER — Encounter: Payer: Self-pay | Admitting: Hematology and Oncology

## 2021-11-12 ENCOUNTER — Encounter: Payer: Self-pay | Admitting: Hematology and Oncology

## 2021-11-15 ENCOUNTER — Encounter: Payer: Medicare Other | Admitting: Neurology

## 2021-11-17 ENCOUNTER — Other Ambulatory Visit: Payer: Self-pay

## 2021-11-18 ENCOUNTER — Other Ambulatory Visit: Payer: Self-pay | Admitting: Hematology and Oncology

## 2021-11-18 DIAGNOSIS — C50512 Malignant neoplasm of lower-outer quadrant of left female breast: Secondary | ICD-10-CM

## 2021-11-19 ENCOUNTER — Inpatient Hospital Stay: Payer: Medicare Other

## 2021-11-19 ENCOUNTER — Encounter: Payer: Self-pay | Admitting: Hematology and Oncology

## 2021-11-19 ENCOUNTER — Inpatient Hospital Stay: Payer: Medicare Other | Attending: Oncology | Admitting: Hematology and Oncology

## 2021-11-19 DIAGNOSIS — C50512 Malignant neoplasm of lower-outer quadrant of left female breast: Secondary | ICD-10-CM

## 2021-11-19 DIAGNOSIS — Z171 Estrogen receptor negative status [ER-]: Secondary | ICD-10-CM

## 2021-11-19 LAB — CBC AND DIFFERENTIAL
HCT: 42 (ref 36–46)
Hemoglobin: 14.5 (ref 12.0–16.0)
Neutrophils Absolute: 7.52
Platelets: 225 10*3/uL (ref 150–400)
WBC: 10.3

## 2021-11-19 LAB — PROTEIN, TOTAL: Total Protein: 8.3 g/dL — AB (ref 6.3–8.2)

## 2021-11-19 LAB — HEPATIC FUNCTION PANEL
ALT: 22 U/L (ref 7–35)
AST: 27 (ref 13–35)
Alkaline Phosphatase: 171 — AB (ref 25–125)
Bilirubin, Total: 0.6

## 2021-11-19 LAB — BASIC METABOLIC PANEL
BUN: 16 (ref 4–21)
CO2: 25 — AB (ref 13–22)
Chloride: 101 (ref 99–108)
Creatinine: 0.9 (ref 0.5–1.1)
Glucose: 265
Potassium: 3.9 mEq/L (ref 3.5–5.1)
Sodium: 136 — AB (ref 137–147)

## 2021-11-19 LAB — CBC: RBC: 5.1 (ref 3.87–5.11)

## 2021-11-19 LAB — COMPREHENSIVE METABOLIC PANEL
Albumin: 4.4 (ref 3.5–5.0)
Calcium: 9.7 (ref 8.7–10.7)

## 2021-11-19 NOTE — Assessment & Plan Note (Addendum)
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  She was treated with lumpectomy she completed chemotherapy at the end of April 2022, followed by radiation therapy completed in June.  She completedmaintenance tastuzumab/pertuzumab for a total of 1 year of HER2 targeted therapyin December 2022.    There are 2 small subcutaneous nodules in the inferior right breast, which may represent furuncles.  She missed her mammogram in September and has not seen Dr. Lilia Pro, so I contacted his office to have these appointments scheduled.  We flushed her port today.  There is mild elevation of her total protein and alkaline phosphatase, so I will plan to see her back in 2 months with a CBC and comprehensive metabolic panel for repeat clinical assessment.

## 2021-11-19 NOTE — Progress Notes (Signed)
[name]  [address] 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  She was treated with lumpectomy she completed chemotherapy at the end of April 2022, followed by radiation therapy completed in June.  She completed maintenance tastuzumab/pertuzumab for a total of 1 year of HER2 targeted therapy in December 2022.    There are 2 small subcutaneous nodules in the inferior right breast, which may represent furuncles.  She missed her mammogram in September and has not seen Dr. Lilia Burgess, so I contacted his office to have these appointments scheduled.  We flushed her port today.  There is mild elevation of her total protein and alkaline phosphatase, so I will plan to see her back in 2 months with a CBC and comprehensive metabolic panel for repeat clinical assessment.   The patient understands the plans discussed today and is in agreement with them.  She knows to contact our office if she develops concerns prior to her next appointment.   I provided 20 minutes of face-to-face time during this encounter and > 50% was spent counseling as documented under my assessment and plan.    Brittney Pickles, PA-C  [facility] AT [facility] [address] [place] [address] Dept: [phone] Dept Fax: [phone]   Orders Placed This Encounter  Procedures   CBC with Differential ([place] Only)    Standing Status:   Future    Standing Expiration Date:   11/20/2022   CMP ([place] only)    Standing Status:   Future    Standing Expiration Date:   49/70/2637   Basic metabolic panel     This external order was created through the Results Console.   Comprehensive metabolic panel    This external order was created through the Results Console.   Hepatic function panel    This external order was created through the Results Console.   Protein, total    This order was created through External Result Entry   CBC and differential    This external order was created through the Results Console.   CBC    This external order was created through the Results Console.      CHIEF COMPLAINT:  CC: History of stage IA HER2 positive breast cancer  Current Treatment: Observation  HISTORY OF PRESENT ILLNESS:   Oncology History  Malignant neoplasm of lower-outer quadrant of left female breast ([place])  09/09/2019 Mammogram   SCREENING BILATERAL MAMMOGRAM: Further evaluation is suggested for possible mass with calcifications and possible dilated ducts in the left breast.   10/01/2019 Mammogram   DIAGNOSTIC UNILATERAL LEFT MAMMOGRAM AND LEFT BREAST ULTRASOUND: A 1.6 cm elongated mass containing pleomorphic calcifications in the 7 o'clock position of the left breast, highly suspicious for breast malignancy.  Tissue sampling is recommended.  A 5 mm hypoechoic, indeterminate mass containing 2 benign-appearing calcificaitons in the 2 o'clock position of the left breast.  Several retroareolar ecstatic ducts containing internal debris or possibly tissue.   10/21/2019 Pathology Results   1.  Breast, left, needle core biopsy, 5 o'clock, 5 cmfn:  -  Invasive ductal carcinoma, grade 3, and high grade ductal  carcinoma in situ with necrosis.  -  Tumor involves multiple cores and measures 5 mm in maximum extent in a single core 2.  Breast, left, needle core biopsy, 2 o'clock:  -  Fibrocystic changes, including sclerosing adenosis with calcifications  -  No atypia, in situ or invasive malignancy identified 3.  Breast, left, needle core biopsy, 11:30 o'clock:  -  Fragmented intraductal papilloma and  dilated duct  -  No atypia, in situ or invasive malignancy identified  HER2: Positive  Ration of HER2/CEN17 signals: 2.23  Average HER2 copy #/cell: 4.23 ER: Negative 0% PR: Negative 0% Ki67: 50%   11/13/2019 Breast MRI   BILATERAL BREAST MRI WITH AND WITHOUT CONTRAST: 1.  Question abnormal lymph node in the left axilla.   2, 1.5 cm enhancement in the left breast approximately 6 o'clock position with associated biopsy clip consistent with recent biopsy cancer.  3. 0.6 cm masslike enhancement in the retroareolar left breast with associated biopsy clip artifact correlating to recently biopsy proven papilloma.   12/08/2019 Initial Diagnosis   Breast cancer ([place])   12/23/2019 Pathology Results   1.  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  Lymph node, sentinel, biopsy, left:             -  One benign, reactive lymph node with focal biopsy site reaction (0/1)  Tumor size: 1.2 x 1.0 x 0.8 cm Histologic grade: 3   01/01/2020 Cancer Staging   Staging form: Breast, AJCC 8th Edition - Clinical stage from 01/01/2020: Stage IA (cT1c, cN0(sn), cM0, G3, ER-, PR-, HER2+) - Signed by Brittney Kaplan, MD on 01/09/2020   01/29/2020 - 01/07/2021 Chemotherapy   Patient is on Treatment Plan : BREAST  Docetaxel + Carboplatin + Trastuzumab + Pertuzumab  (TCHP) q21d (COMPLETED TCHP ON 05/20/2020) / Trastuzumab + Pertuzumab q21d      06/15/2020 - 07/21/2020 Radiation Therapy   5000 cGy in 25 fractions    10/14/2020 Mammogram   DIAGNOSTIC BILATERAL MAMMOGRAM: No evidence of malignancy in either breast.    10/29/2020 Echocardiogram   Overall left ventricular systolic function is normal with, an EF between 55 - 60 %.        INTERVAL  HISTORY:  Brittney Burgess is here today for repeat clinical assessment.  She states she has been doing fairly well.  She missed her mammogram last month and has not seen Dr. Lilia Burgess.  She denies any changes in her breast.  She does report chronic boils of the skin including her breast.  She sees a neurologist in [place] and had 1 therapy session in [place] for her neuropathy.  She tried Cymbalta for the neuropathy, but it made her have dark thoughts and mood changes.  She continues gabapentin 300 mg twice daily and oxycodone 5 mg every 4 hours as needed.  She is able to ambulate with a walker.  She states the neuropathy is stable. She denies  fevers or chills. She denies pain. Her appetite is good. Her weight has been stable.  REVIEW OF SYSTEMS:  Review of Systems  Constitutional:  Negative for appetite change, chills, fatigue, fever and unexpected weight change.  HENT:   Negative for lump/mass, mouth sores and sore throat.   Respiratory:  Negative for cough and shortness of breath.   Cardiovascular:  Negative for chest pain and leg swelling.  Gastrointestinal:  Negative for abdominal pain, constipation, diarrhea, nausea and vomiting.  Endocrine: Negative for hot flashes.  Genitourinary:  Negative for difficulty urinating, dysuria, frequency and hematuria.   Musculoskeletal:  Positive for gait problem. Negative for arthralgias, back pain and myalgias.  Skin:  Negative for rash.  Neurological:  Positive for gait problem and numbness. Negative for dizziness and headaches.  Hematological:  Negative for adenopathy. Does not bruise/bleed easily.  Psychiatric/Behavioral:  Negative for depression and sleep disturbance. The patient is not nervous/anxious.      VITALS:  Blood pressure (!) 139/91, pulse 98, temperature 98.4 F (36.9 C), temperature source Oral, resp. rate 20, height _0  (1.753 m), weight 186 lb 4.8 oz (84.5 kg), SpO2 98 %.  Wt Readings from Last 3 Encounters:  11/19/21 186 lb 4.8 oz (84.5 kg)   09/30/21 187 lb (84.8 kg)  07/20/21 181 lb 4.8 oz (82.2 kg)    Body mass index is 27.51 kg/m.  Performance status (ECOG): 2 - Symptomatic, <50% confined to bed  PHYSICAL EXAM:  Physical Exam Vitals and nursing note reviewed.  Constitutional:      General: She is not in acute distress.    Appearance: Normal appearance.  HENT:     Head: Normocephalic and atraumatic.     Mouth/Throat:     Mouth: Mucous membranes are moist.     Pharynx: Oropharynx is clear. No oropharyngeal exudate or posterior oropharyngeal erythema.  Eyes:     General: No scleral icterus.    Extraocular Movements: Extraocular movements intact.     Conjunctiva/sclera: Conjunctivae normal.     Pupils: Pupils are equal, round, and reactive to light.  Cardiovascular:     Rate and Rhythm: Normal rate and regular rhythm.     Heart sounds: Normal heart sounds. No murmur heard.    No friction rub. No gallop.  Pulmonary:     Effort: Pulmonary effort is normal.     Breath sounds: Normal breath sounds. No wheezing, rhonchi or rales.  Chest:  Breasts:    Right: Mass (2 firm nodules inferior right breast at 5 to 6 o'clock) present. No swelling, bleeding, inverted nipple, nipple discharge, skin change or tenderness.     Left: Normal. No swelling, bleeding, inverted nipple, mass, nipple discharge, skin change or tenderness.  Abdominal:     General: There is no distension.     Palpations: Abdomen is soft. There is no hepatomegaly, splenomegaly or mass.     Tenderness: There is no abdominal tenderness.  Musculoskeletal:        General: Normal range of motion.     Cervical back: Normal range of motion and neck supple. No tenderness.     Right lower leg: No edema.     Left lower leg: No edema.  Lymphadenopathy:     Cervical: No cervical adenopathy.     Upper Body:     Right upper body: No supraclavicular or axillary adenopathy.     Left upper body: No supraclavicular or axillary adenopathy.     Lower Body: No right  inguinal adenopathy. No  left inguinal adenopathy.  Skin:    General: Skin is warm and dry.     Coloration: Skin is not jaundiced.     Findings: Lesion (multiple lesions of the anterior chest wall and breasts in various stages of healing) present. No rash.  Neurological:     Mental Status: She is alert and oriented to person, place, and time.     Cranial Nerves: No cranial nerve deficit.  Psychiatric:        Mood and Affect: Mood normal.        Behavior: Behavior normal.        Thought Content: Thought content normal.     LABS:      Latest Ref Rng & Units 11/19/2021   12:00 AM 07/20/2021   12:00 AM 04/19/2021   12:00 AM  CBC  WBC  10.3     7.6     8.0      Hemoglobin 12.0 - 16.0 14.5     13.5     12.7      Hematocrit 36 - 46 42     41     39      Platelets 150 - 400 K/uL 225     190     220         This result is from an external source.      Latest Ref Rng & Units 11/19/2021   12:00 AM 09/30/2021   10:49 AM 07/20/2021   12:00 AM  CMP  BUN 4 - _0 Creatinine 0.5 - 1.1 0.9      1.0      Sodium 137 - 147 136      141      Potassium 3.5 - 5.1 mEq/L 3.9      3.6      Chloride 99 - 108 101      105      CO2 13 - _1 Calcium 8.7 - 10.7 9.7      9.6      Total Protein 6.3 - 8.2 g/dL 8.3     8.1    Alkaline Phos 25 - 125 171      119      AST 13 - 35 27      21      ALT 7 - 35 U/L 22      16         This result is from an external source.     No results found for: "CEA1", "CEA" / No results found for: "CEA1", "CEA" No results found for: "PSA1" No results found for: "KDX833" No results found for: "CAN125"  Lab Results  Component Value Date   ALBUMINELP 4.7 09/30/2021   A1GS 0.4 (H) 09/30/2021   A2GS 0.7 09/30/2021   BETS 0.5 09/30/2021   BETA2SER 0.4 09/30/2021   GAMS 1.5 09/30/2021   SPEI  09/30/2021     Comment:     Normal Serum Protein Electrophoresis Pattern. No abnormal protein bands (M-protein) detected.    No results found  for: "TIBC", "FERRITIN", "IRONPCTSAT" No results found for: "LDH"  STUDIES:  No results found.    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   Hypertension    Neuromuscular disorder ([facility])    neuropathy bilateral feet and lower legs   Port-A-Cath in place    right upper chest   Walker as ambulation aid     Past Surgical History:  Procedure Laterality Date   BIOPSY THYROID  04/2019   2.9 cm mass, benign   BREAST LUMPECTOMY Left 12/25/2019   HYSTERECTOMY ABDOMINAL WITH SALPINGO-OOPHORECTOMY Bilateral 2004   due to menorrhagia   MYOMECTOMY  1995   x6 for fibroids   TOOTH EXTRACTION N/A 05/14/2021   Procedure: DENTAL RESTORATION/EXTRACTIONS;  Surgeon: Diona Browner, DMD;  Location: [facility];  Service: Oral Surgery;  Laterality: N/A;    Family History  Problem Relation Age of Onset   Cancer Mother    Alcohol abuse Father    Diabetes Brother    Cancer Maternal Aunt     Social History:  reports that she has been smoking cigarettes. She has a 32.00 pack-year smoking history. She has never used smokeless tobacco. She reports that she does not drink alcohol and does not use drugs.The patient is alone today.  Allergies:  Allergies  Allergen Reactions   Metformin And Related Other (See Comments)    Twitching, nausea    Current Medications: Current Outpatient Medications  Medication Sig Dispense Refill   amLODipine (NORVASC) 5 MG tablet Take 1 tablet (5 mg total) by mouth daily. 30 tablet 0   Blood Glucose Monitoring Suppl (TRUE METRIX METER) DEVI dx 11.9 (Medicaid preferred) USE FOR TESTING BLOOD SUGARS when Fasting daily     DULoxetine (CYMBALTA) 30 MG capsule Take 1 capsule (30 mg total) by mouth at bedtime for 7 days, THEN 2 capsules (60 mg total) at bedtime. (Patient not taking: Reported on  11/19/2021) 187 capsule 0   gabapentin (NEURONTIN) 300 MG capsule Take 1 capsule (300 mg total) by mouth 2 (two) times daily for 7 days, THEN 1 capsule (300 mg total) in the morning for 7 days. 180 capsule 5   GNP ULTICARE PEN NEEDLES 32G X 4 MM MISC      hydrochlorothiazide (HYDRODIURIL) 25 MG tablet TAKE 1 TABLET (25 MG TOTAL) BY MOUTH DAILY. 90 tablet 1   insulin glargine (LANTUS) 100 UNIT/ML Solostar Pen Inject 10-40 Units into the skin See admin instructions. Take 10 units in the morning and 40 units at bedtime     JANUVIA 100 MG tablet Take 100 mg by mouth daily.     KLOR-CON M20 20 MEQ tablet TAKE 1 TABLET BY MOUTH TWICE A DAY 60 tablet 1   losartan (COZAAR) 50 MG tablet TAKE 1 TABLET BY MOUTH EVERY DAY 30 tablet 2   ondansetron (ZOFRAN) 4 MG tablet Take 4 mg by mouth every 8 (eight) hours as needed for nausea or vomiting.     oxyCODONE (OXY IR/ROXICODONE) 5 MG immediate release tablet Take 1 tablet (5 mg total) by mouth every 6 (six) hours as needed for severe pain. 120 tablet 0   TRUE METRIX BLOOD GLUCOSE TEST test strip SMARTSIG:1 Via Meter Daily PRN     TRUEplus Lancets 28G MISC SMARTSIG:1 Topical Daily PRN     VICTOZA 18 MG/3ML SOPN Inject into the skin. (Patient not taking: Reported on 11/19/2021)     No current facility-administered medications for this visit.

## 2021-11-23 ENCOUNTER — Other Ambulatory Visit: Payer: Self-pay | Admitting: Hematology and Oncology

## 2021-11-23 DIAGNOSIS — E876 Hypokalemia: Secondary | ICD-10-CM

## 2021-12-03 ENCOUNTER — Other Ambulatory Visit: Payer: Self-pay

## 2021-12-03 DIAGNOSIS — T451X5A Adverse effect of antineoplastic and immunosuppressive drugs, initial encounter: Secondary | ICD-10-CM

## 2021-12-03 MED ORDER — OXYCODONE HCL 5 MG PO TABS: 5.0000 mg | ORAL_TABLET | Freq: Four times a day (QID) | ORAL | 0 refills | Status: DC | PRN

## 2021-12-04 ENCOUNTER — Other Ambulatory Visit: Payer: Self-pay | Admitting: Neurology

## 2021-12-04 DIAGNOSIS — G620 Drug-induced polyneuropathy: Secondary | ICD-10-CM

## 2021-12-07 ENCOUNTER — Telehealth: Payer: Self-pay

## 2021-12-07 ENCOUNTER — Inpatient Hospital Stay: Admission: RE | Admit: 2021-12-07 | Payer: Medicare Other | Source: Ambulatory Visit

## 2021-12-07 NOTE — Telephone Encounter (Signed)
Just for your information Brittney Burgess cancelled her MRI and did not reschedule MRN [id number]. Her next appointment is

## 2021-12-27 NOTE — Progress Notes (Deleted)
I saw Brittney Burgess in neurology clinic on 12/30/21 in follow up for neuropathy.  HPI: Brittney Burgess is a 61 y.o. year old female with a history of breast cancer s/p lumpectomy and sentinel lymph node biopsy, chemotherapy with docetaxel, carboplatin, trastuzumab, and pertuzumab, HTN, DM, current smoker who we last saw on 09/30/21.  To briefly review: She would find that is was not walking straight and that her face was drooping. She was taken to the ED worried she was having a stroke. They thought this was caused by prior thyroid problems. Her symptoms improved. She is not sure why, but they did a mammogram and found to have cancer. She underwent surgery and had chemo. After chemo started she started having numbness and tingling in her bilateral legs (left more than right). She then started having numbness and tingling in bilateral hands. She has difficulty holding on to things. She started gabapentin soon after (in 2021). She is currently taking gabapentin 600 mg in am and 300 mg in pm. It is helping a little. She then started noticing muscle jerking. She also thinks her balance has gotten worse since starting the gabapentin. She uses a walker to get around, or needs a wheelchair for bigger distances. She has used the walker for about 1 year.   Overall, the neuropathy has improved a little. She has only been on gabapentin for neuropathy and has not done physical therapy.   She endorses dysarthria since her breast cancer surgery. Her speech got a little worse after she had her teeth removed. She currently thinks her speech is off due to her new teeth.   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 -Completed chemotherapy with docetaxel, carboplatin, trastuzumab, and pertuzumab on 05/2020. Doxetaxel was discontinued with her 5th cycle due to neuropathy. -Radiation therapy completed in  07/2020 (5000 cGy in 25 fractions) -Maintenance tastuzumab/pertuzumab until 12/2020 -Numbness has persisted and worsened. Gabapentin started and increased to 1200 mg TID with only mild improvement. Oxycodone 10 mg PRN has also been given.     EtOH use: No  Restrictive diet? No Family history of neuropathy/myopathy/NM disease? No. Cousin has MS.  09/30/21 Assessment: Overall, the numbness and tingling in legs and hands are consistent with chemotherapy-induced peripheral neuropathy. I will get an EMG to confirm and blood work to look for other treatable causes that may be contributing to her neuropathy. Her neuropathy is currently being treated with gabapentin, which may be the cause of her jerking (myoclonus) and perhaps contributing to ataxia. I would like to taper this and start Cymbalta, as Cymbalta has better evidence for chemotherapy induced peripheral neuropathy. She would also benefit from physical therapy.   In terms of her facial asymmetry, possible left sided weakness, and dysarthria, I want to ensure there is not a central process at play. Patient was concerned she had a stroke, but never had an MRI brain per her account. Given her history of cancer, there are other central etiologies that should be evaluated.   PLAN: -Blood work: B12, HbA1c, IFE, SPEP -EMG (LLE, PN protocol) -MRI brain w/wo contract -Physical therapy referral for gait abnormalities (patient prefers someone close to [place]) -Start Cymbalta 30 mg at night for 1 week then 60 mg at night thereafter -Taper gabapentin as follows: 600 mg/300 mg             -300 mg/300 mg starting  today for 1 week, then             -300 mg in the morning for 1 week, then stop  Since their last visit: Lab work showed a borderline B12 level (301). I recommended B12 1000 mcg daily. Her HbA1c was also high at 10.4. She was to discuss with her PCP***.   Patient cancelled her MRI brain and did not reschedule. She also cancelled the EMG on  11/15/21 and did not reschedule.***  ***  ROS: Pertinent positive and negative systems reviewed in HPI. ***   MEDICATIONS:  Outpatient Encounter Medications as of 12/30/2021  Medication Sig   amLODipine (NORVASC) 5 MG tablet Take 1 tablet (5 mg total) by mouth daily.   Blood Glucose Monitoring Suppl (TRUE METRIX METER) DEVI dx 11.9 (Medicaid preferred) USE FOR TESTING BLOOD SUGARS when Fasting daily   DULoxetine (CYMBALTA) 30 MG capsule Take 1 capsule (30 mg total) by mouth at bedtime for 7 days, THEN 2 capsules (60 mg total) at bedtime. (Patient not taking: Reported on 11/19/2021)   gabapentin (NEURONTIN) 300 MG capsule Take 1 capsule (300 mg total) by mouth 2 (two) times daily for 7 days, THEN 1 capsule (300 mg total) in the morning for 7 days.   GNP ULTICARE PEN NEEDLES 32G X 4 MM MISC    hydrochlorothiazide (HYDRODIURIL) 25 MG tablet TAKE 1 TABLET (25 MG TOTAL) BY MOUTH DAILY.   insulin glargine (LANTUS) 100 UNIT/ML Solostar Pen Inject 10-40 Units into the skin See admin instructions. Take 10 units in the morning and 40 units at bedtime   JANUVIA 100 MG tablet Take 100 mg by mouth daily.   KLOR-CON M20 20 MEQ tablet TAKE 1 TABLET BY MOUTH TWICE A DAY   losartan (COZAAR) 50 MG tablet TAKE 1 TABLET BY MOUTH EVERY DAY   ondansetron (ZOFRAN) 4 MG tablet Take 4 mg by mouth every 8 (eight) hours as needed for nausea or vomiting.   oxyCODONE (OXY IR/ROXICODONE) 5 MG immediate release tablet Take 1 tablet (5 mg total) by mouth every 6 (six) hours as needed for severe pain.   TRUE METRIX BLOOD GLUCOSE TEST test strip SMARTSIG:1 Via Meter Daily PRN   TRUEplus Lancets 28G MISC SMARTSIG:1 Topical Daily PRN   VICTOZA 18 MG/3ML SOPN Inject into the skin. (Patient not taking: Reported on 11/19/2021)   No facility-administered encounter medications on file as of 12/30/2021.    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   Hypertension    Neuromuscular disorder ([place])    neuropathy bilateral feet and lower legs   Port-A-Cath in place    right upper chest   Walker as ambulation aid     PAST SURGICAL HISTORY: Past Surgical History:  Procedure Laterality Date   BIOPSY THYROID  04/2019   2.9 cm mass, benign   BREAST LUMPECTOMY Left 12/25/2019   HYSTERECTOMY ABDOMINAL WITH SALPINGO-OOPHORECTOMY Bilateral 2004   due to menorrhagia   MYOMECTOMY  1995   x6 for fibroids   TOOTH EXTRACTION N/A 05/14/2021   Procedure: DENTAL RESTORATION/EXTRACTIONS;  Surgeon: Diona Browner, DMD;  Location: [facility];  Service: Oral Surgery;  Laterality: N/A;    ALLERGIES: Allergies  Allergen Reactions   Metformin And Related Other (See Comments)  Twitching, nausea    FAMILY HISTORY: Family History  Problem Relation Age of Onset   Cancer Mother    Alcohol abuse Father    Diabetes Brother    Cancer Maternal Aunt     SOCIAL HISTORY: Social History   Tobacco Use   Smoking status: Every Day    Packs/day: 1.00    Years: 32.00    Total pack years: 32.00    Types: Cigarettes   Smokeless tobacco: Never   Tobacco comments:    Patient is cutting down to 1 pack every 3-4 days as of 05/2021  Vaping Use   Vaping Use: Never used  Substance Use Topics   Alcohol use: Never   Drug use: Never   Social History   Social History Narrative   Right handed    Caffeine 1 cup daily   Live one story    Objective:  Vital Signs:  There were no vitals taken for this visit.  General:*** General appearance: Awake and alert. No distress. Cooperative with exam. Skin: No rash or jaundice. HEENT: Atraumatic. Anicteric. Lungs: Non-labored breathing on room air  Heart: Regular Abdomen: Soft, non tender. Extremities: No edema. No obvious deformity.  Musculoskeletal: No obvious joint swelling.  Neurological: Mental  Status: Alert. Speech fluent. No pseudobulbar affect Cranial Nerves: CNII: No RAPD. Visual fields intact. CNIII, IV, VI: PERRL. No nystagmus. EOMI. CN V: Facial sensation intact bilaterally to fine touch. [place] clench strong. Jaw jerk***. CN VII: Facial muscles symmetric and strong. No ptosis at rest or after sustained upgaze***. CN VIII: Hears finger rub well bilaterally. CN IX: No hypophonia. CN X: Palate elevates symmetrically. CN XI: Full strength shoulder shrug bilaterally. CN XII: Tongue protrusion full and midline. No atrophy or fasciculations. No significant dysarthria*** Motor: Tone is ***. *** fasciculations in *** extremities. *** atrophy. No grip or percussive myotonia.  Individual muscle group testing (MRC grade out of 5):  Movement     Neck flexion ***    Neck extension ***     Right Left   Shoulder abduction *** ***   Shoulder adduction *** ***   Shoulder ext rotation *** ***   Shoulder int rotation *** ***   Elbow flexion *** ***   Elbow extension *** ***   Wrist extension *** ***   Wrist flexion *** ***   Finger abduction - FDI *** ***   Finger abduction - ADM *** ***   Finger extension *** ***   Finger distal flexion - 2/3 *** ***   Finger distal flexion - 4/5 *** ***   Thumb flexion - FPL *** ***   Thumb abduction - APB *** ***    Hip flexion *** ***   Hip extension *** ***   Hip adduction *** ***   Hip abduction *** ***   Knee extension *** ***   Knee flexion *** ***   Dorsiflexion *** ***   Plantarflexion *** ***   Inversion *** ***   Eversion *** ***   Great toe extension *** ***   Great toe flexion *** ***     Reflexes:  Right Left  Bicep *** ***  Tricep *** ***  BrRad *** ***  Knee *** ***  Ankle *** ***   Pathological Reflexes: Babinski: *** response bilaterally*** Hoffman: *** Troemner: *** Pectoral: *** Palmomental: *** Facial: *** Midline tap: *** Sensation: Pinprick: *** Vibration: *** Temperature:  *** Proprioception: *** Coordination: Intact finger-to- nose-finger and heel-to-shin bilaterally. Romberg negative.*** Gait: Able to rise from chair  with arms crossed unassisted. Normal, narrow-based gait. Able to tandem walk. Able to walk on toes and heels.***   Lab and Test Review: New results: 09/30/21 labs: IFE, SPEP: no M protein B12 borderline low: 301 HbA1c: 10.4  Previously reviewed testing: Internal labs: CBC and CMP unremarkable   External labs: Normal or unremarkable: CMP, CBC (07/20/21)   ASSESSMENT: This is Brittney Burgess, a 62 y.o. female with:  ***  Plan: ***  Return to clinic in ***  Total time spent reviewing records, interview, history/exam, documentation, and coordination of care on day of encounter:  *** min  Kai Levins, MD

## 2021-12-30 ENCOUNTER — Ambulatory Visit: Payer: Medicare Other | Admitting: Neurology

## 2022-01-03 IMAGING — MR MR BREAST BILAT WO/W CM
8 of 11 series · 32 of 48 positions shown · IV contrast (7 ml gadavist)
Comparison: Prior films

CLINICAL DATA: Recent diagnosis of left breast cancer and left
breast papilloma.

LABS:  Does not apply
EXAM:
BILATERAL BREAST MRI WITH AND WITHOUT CONTRAST
TECHNIQUE: Multiplanar, multisequence MR images of both breasts were obtained
prior to and following the intravenous administration of 7 ml of
Gadavist

[Series 2: t2_tirm_tra ipat (a-p) · axial · 3.0mm · 0.70mm/px · 1 of 62 slices shown]
[im 1/62]
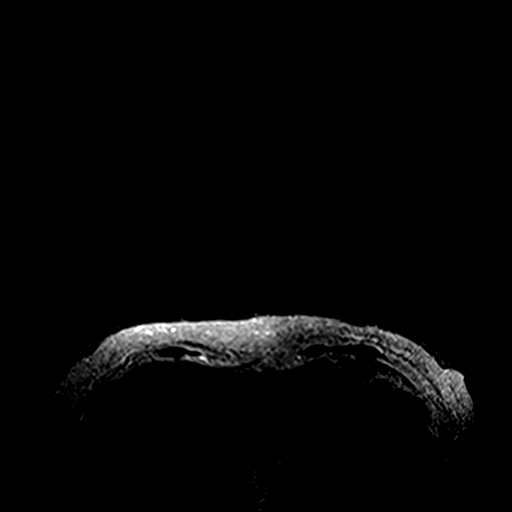

[Series 3: fl3d pre-cm no · axial · non-contrast · 1.2mm · 0.94mm/px · z∈[-68,+123]mm · 5 of 160 slices shown]
[im 1/160]
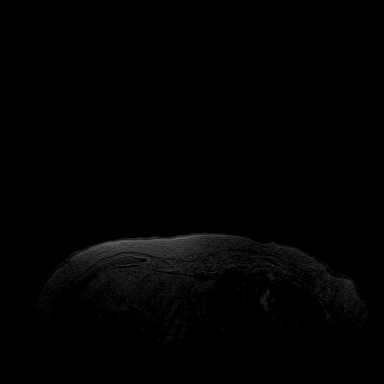
[im 40/160]
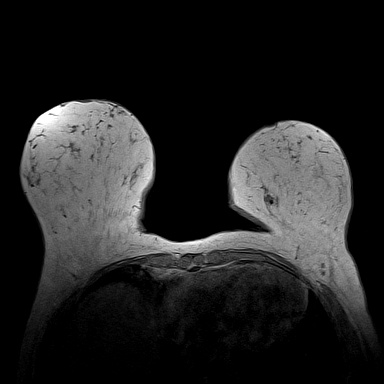
[im 80/160]
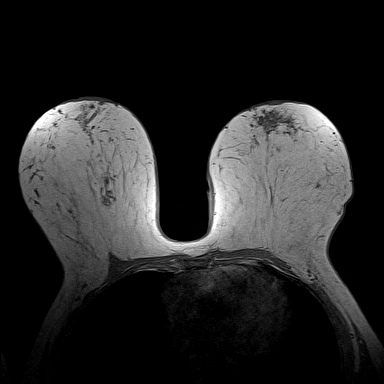
[im 120/160]
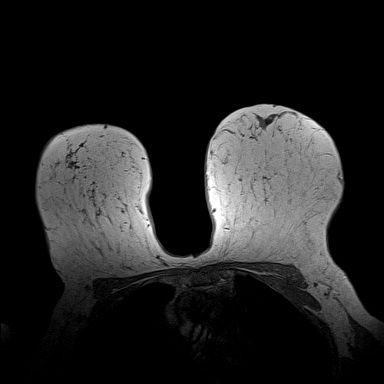
[im 160/160]
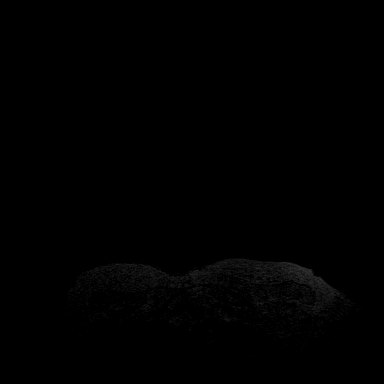

[Series 4: fl3d pre-cm · axial · non-contrast · 1.2mm · 0.94mm/px · z∈[-68,+123]mm · 5 of 160 slices shown]
[im 1/160]
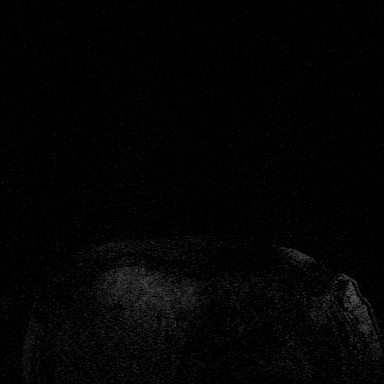
[im 40/160]
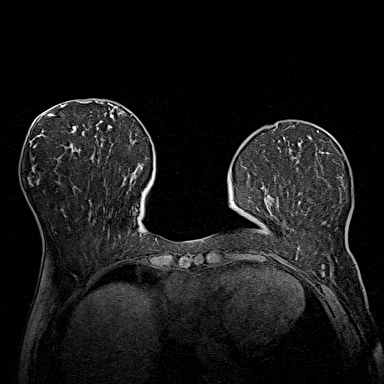
[im 80/160]
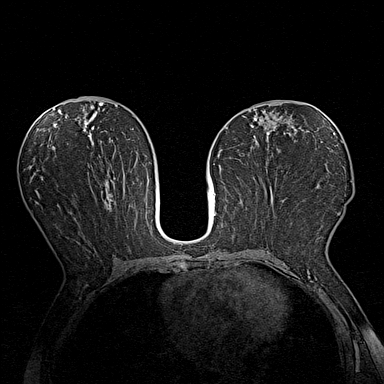
[im 120/160]
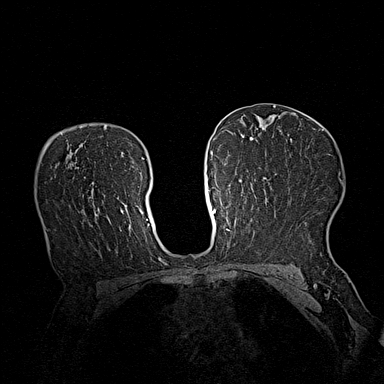
[im 160/160]
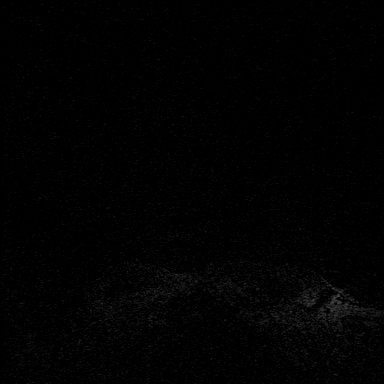

[Series 5: fl3d post-cm 20 · axial · 1.2mm · 0.94mm/px · z∈[-68,+123]mm · 5 of 160 slices shown (1 of 3)]
[im 1/160]
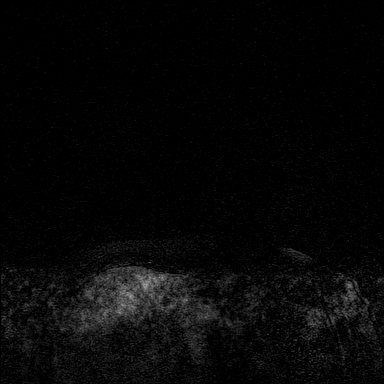
[im 40/160]
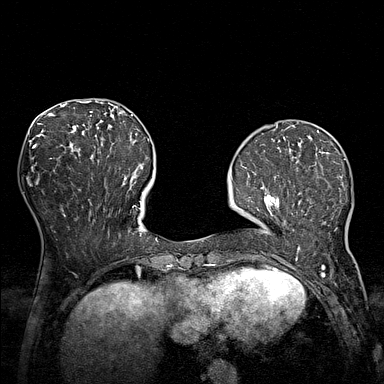
[im 80/160]
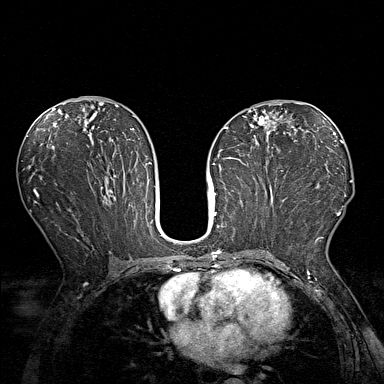
[im 120/160]
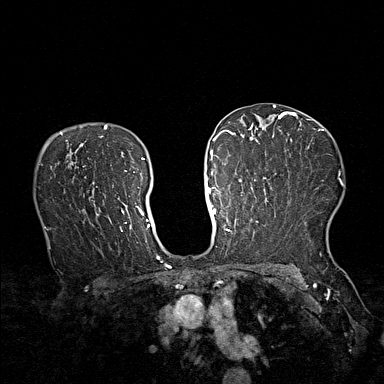
[im 160/160]
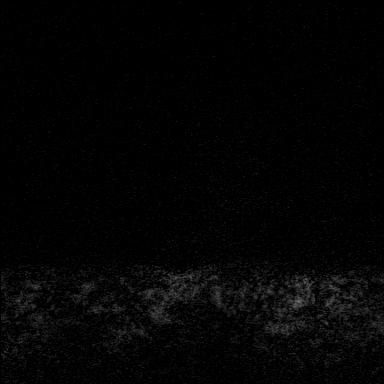

[Series 6: fl3d post-cm 20 · axial · 1.2mm · 0.94mm/px · z∈[-68,+123]mm · 6 of 160 slices shown (2 of 3)]
[im 1/160]
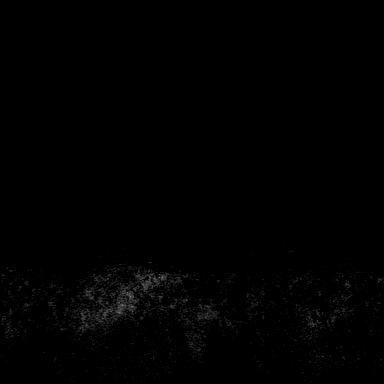
[im 32/160]
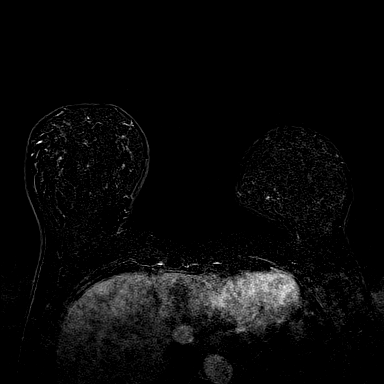
[im 64/160]
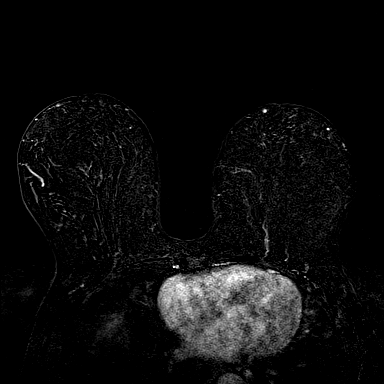
[im 96/160]
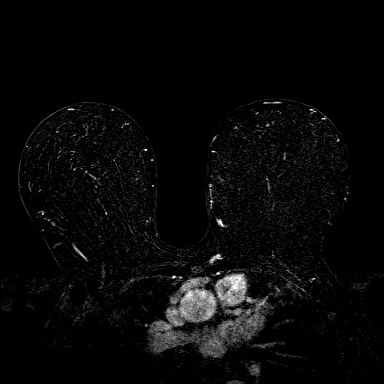
[im 128/160]
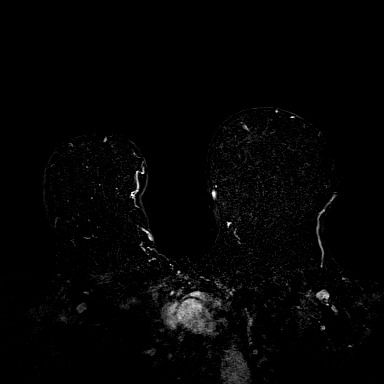
[im 160/160]
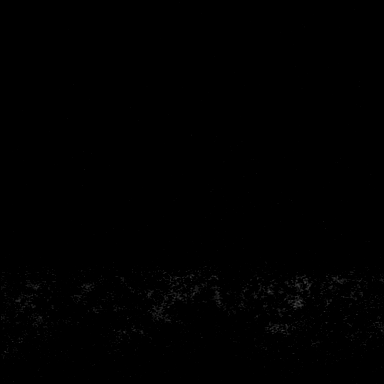

[Series 7: fl3d post-cm 20 · axial · 192.0mm · 0.94mm/px · 1 of 1 slices shown (3 of 3)]
[im 1/1]
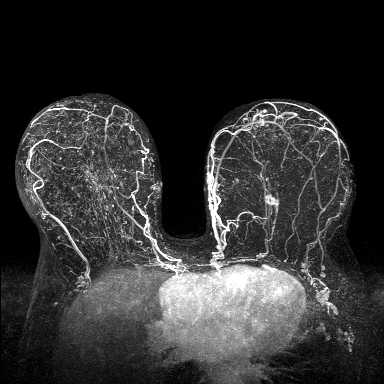

[Series 8: fl3d post-cm 3min · axial · 1.2mm · 0.94mm/px · z∈[-68,+123]mm · 6 of 160 slices shown]
[im 1/160]
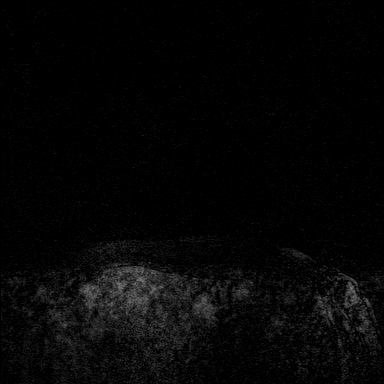
[im 32/160]
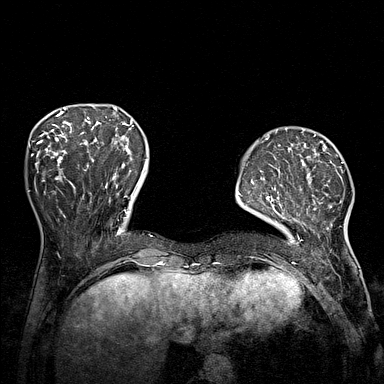
[im 64/160]
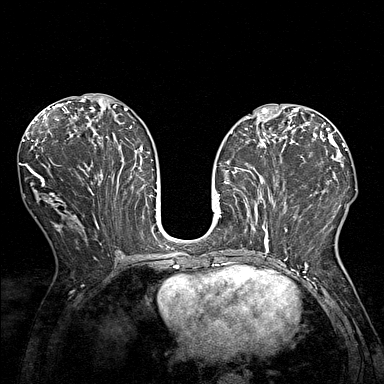
[im 96/160]
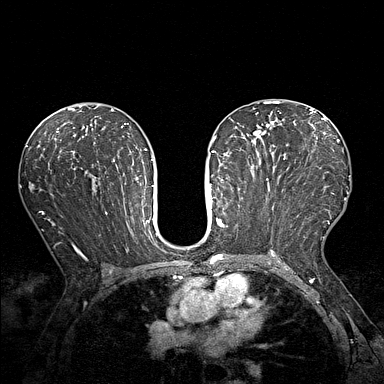
[im 128/160]
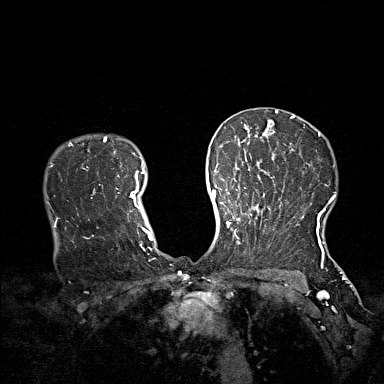
[im 160/160]
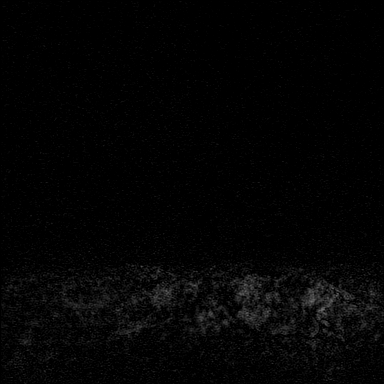

[Series 9: fl3d post-cm 3min_sub · axial · 1.2mm · 0.94mm/px · z∈[-68,+8]mm · 3 of 160 slices shown]
[im 1/160]
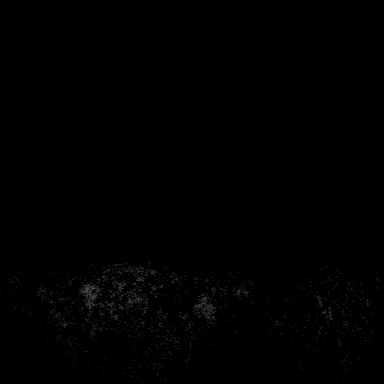
[im 32/160]
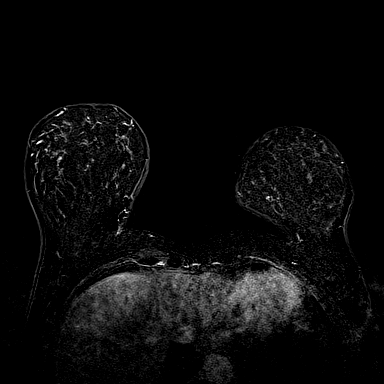
[im 64/160]
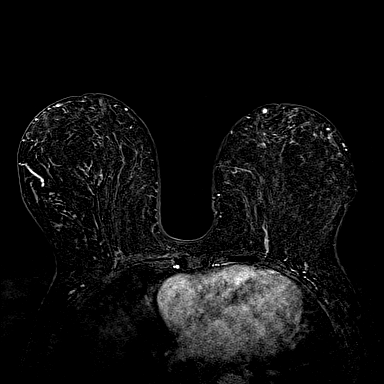

[32 of 48 positions shown; findings below may reference images not displayed]

Three-dimensional MR images were rendered by post-processing of the
original MR data on an independent workstation. The
three-dimensional MR images were interpreted, and findings are
reported in the following complete MRI report for this study. Three
dimensional images were evaluated at the independent interpreting
workstation using the DynaCAD thin client.
FINDINGS: Breast composition: b. Scattered fibroglandular tissue.

Background parenchymal enhancement: Mild.

Right breast: No mass or abnormal enhancement.

Left breast: There is masslike enhancement in the left breast
approximately 6 o'clock position measuring 1.5 x 1 x 0.9 cm with
associated biopsy clip artifact consistent with recent biopsy
cancer. There is 0.6 x 0.5 x 0.6 cm masslike enhancement in the
retroareolar left breast with associated biopsy clip artifact
correlating to recently biopsy lesion which show papilloma in the
retroareolar region/11:30 o'clock.

Lymph nodes: There is a 1.4 x 1.2 cm lymph node in the left axilla
without definite fatty hilum and larger compared to other lymph
nodes. (Series 6, image 31)

Ancillary findings:  None.
IMPRESSION: 1.  Question abnormal lymph node in the left axilla.

2, 1.5 cm enhancement in the left breast approximately 6 o'clock
position with associated biopsy clip consistent with recent biopsy
cancer.

3. 0.6 cm masslike enhancement in the retroareolar left breast with
associated biopsy clip artifact correlating to recently biopsy
proven papilloma.

RECOMMENDATION:
Second-look ultrasound of left axilla with possible biopsy.

Management on clinical basis for the patient's left breast lesions.

BI-RADS CATEGORY  4: Suspicious.

## 2022-01-18 ENCOUNTER — Other Ambulatory Visit: Payer: Self-pay | Admitting: Hematology and Oncology

## 2022-01-18 DIAGNOSIS — Z171 Estrogen receptor negative status [ER-]: Secondary | ICD-10-CM

## 2022-01-19 ENCOUNTER — Telehealth: Payer: Self-pay

## 2022-01-19 ENCOUNTER — Inpatient Hospital Stay: Payer: Medicare Other

## 2022-01-19 ENCOUNTER — Inpatient Hospital Stay: Payer: Medicare Other | Attending: Oncology | Admitting: Hematology and Oncology

## 2022-01-19 ENCOUNTER — Encounter: Payer: Self-pay | Admitting: Hematology and Oncology

## 2022-01-19 VITALS — BP 148/87 | HR 103 | Temp 98.0°F | Resp 20 | Ht 69.0 in | Wt 188.4 lb

## 2022-01-19 DIAGNOSIS — Z171 Estrogen receptor negative status [ER-]: Secondary | ICD-10-CM

## 2022-01-19 DIAGNOSIS — C50512 Malignant neoplasm of lower-outer quadrant of left female breast: Secondary | ICD-10-CM | POA: Diagnosis present

## 2022-01-19 DIAGNOSIS — Z923 Personal history of irradiation: Secondary | ICD-10-CM | POA: Insufficient documentation

## 2022-01-19 DIAGNOSIS — Z170 Estrogen receptor positive status [ER+]: Secondary | ICD-10-CM

## 2022-01-19 DIAGNOSIS — Z9221 Personal history of antineoplastic chemotherapy: Secondary | ICD-10-CM | POA: Diagnosis not present

## 2022-01-19 LAB — CMP (CANCER CENTER ONLY)
ALT: 25 U/L (ref 0–44)
AST: 22 U/L (ref 15–41)
Albumin: 3.6 g/dL (ref 3.5–5.0)
Alkaline Phosphatase: 139 U/L — ABNORMAL HIGH (ref 38–126)
Anion gap: 10 (ref 5–15)
BUN: 16 mg/dL (ref 6–20)
CO2: 26 mmol/L (ref 22–32)
Calcium: 9.8 mg/dL (ref 8.9–10.3)
Chloride: 100 mmol/L (ref 98–111)
Creatinine: 1.12 mg/dL — ABNORMAL HIGH (ref 0.44–1.00)
GFR, Estimated: 57 mL/min — ABNORMAL LOW (ref >60)
Glucose, Bld: 364 mg/dL — ABNORMAL HIGH (ref 70–99)
Potassium: 4.1 mmol/L (ref 3.5–5.1)
Sodium: 136 mmol/L (ref 135–145)
Total Bilirubin: 0.5 mg/dL (ref 0.3–1.2)
Total Protein: 8.2 g/dL — ABNORMAL HIGH (ref 6.5–8.1)

## 2022-01-19 LAB — CBC WITH DIFFERENTIAL (CANCER CENTER ONLY)
Abs Immature Granulocytes: 0.06 10*3/uL (ref 0.00–0.07)
Basophils Absolute: 0 10*3/uL (ref 0.0–0.1)
Basophils Relative: 0 %
Eosinophils Absolute: 0.1 10*3/uL (ref 0.0–0.5)
Eosinophils Relative: 1 %
HCT: 44.7 % (ref 36.0–46.0)
Hemoglobin: 14.7 g/dL (ref 12.0–15.0)
Immature Granulocytes: 1 %
Lymphocytes Relative: 14 %
Lymphs Abs: 1.4 10*3/uL (ref 0.7–4.0)
MCH: 27.4 pg (ref 26.0–34.0)
MCHC: 32.9 g/dL (ref 30.0–36.0)
MCV: 83.4 fL (ref 80.0–100.0)
Monocytes Absolute: 0.5 10*3/uL (ref 0.1–1.0)
Monocytes Relative: 5 %
Neutro Abs: 7.7 10*3/uL (ref 1.7–7.7)
Neutrophils Relative %: 79 %
Platelet Count: 290 10*3/uL (ref 150–400)
RBC: 5.36 MIL/uL — ABNORMAL HIGH (ref 3.87–5.11)
RDW: 14.2 % (ref 11.5–15.5)
WBC Count: 9.8 10*3/uL (ref 4.0–10.5)
nRBC: 0 % (ref 0.0–0.2)

## 2022-01-19 NOTE — Telephone Encounter (Signed)
-----   Message from Marvia Pickles, PA-C sent at 01/19/2022 11:56 AM EST ----- Please let her know her labs have improved, so no worries. Will continue to monitor. Thanks

## 2022-01-19 NOTE — Progress Notes (Signed)
[name]  [address] 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  She was treated with lumpectomy she completed chemotherapy at the end of April 2022, followed by radiation therapy completed in June.  She completed maintenance tastuzumab/pertuzumab for a total of 1 year of HER2 targeted therapy in December 2022.    Bilateral diagnostic mammogram in November did not reveal any evidence of malignancy.  Her labs are stable.  We flushed her port today.  I will plan to see her back in 3 months with a CBC and comprehensive metabolic panel for repeat clinical assessment.   The patient understands the plans discussed today and is in agreement with them.  She knows to contact our office if she develops concerns prior to her next appointment.   I provided 15 minutes of face-to-face time during this encounter and > 50% was spent counseling as documented under my assessment and plan.    Brittney Pickles, PA-C  [facility] [address] [place] [address] Dept: [phone] Dept Fax: [phone]   No orders of the defined types were placed in this encounter.     CHIEF COMPLAINT:  CC: Stage IA HER2 receptor positive breast cancer  Current Treatment: Observation  HISTORY OF PRESENT ILLNESS:   Oncology History  Malignant neoplasm of lower-outer quadrant of left female breast ([place])  09/09/2019 Mammogram   SCREENING BILATERAL MAMMOGRAM: Further evaluation is suggested for possible mass with calcifications and possible dilated ducts in the left breast.   10/01/2019  Mammogram   DIAGNOSTIC UNILATERAL LEFT MAMMOGRAM AND LEFT BREAST ULTRASOUND: A 1.6 cm elongated mass containing pleomorphic calcifications in the 7 o'clock position of the left breast, highly suspicious for breast malignancy.  Tissue sampling is recommended.  A 5 mm hypoechoic, indeterminate mass containing 2 benign-appearing calcificaitons in the 2 o'clock position of the left breast.  Several retroareolar ecstatic ducts containing internal debris or possibly tissue.   10/21/2019 Pathology Results   1.  Breast, left, needle core biopsy, 5 o'clock, 5 cmfn:  -  Invasive ductal carcinoma, grade 3, and high grade ductal carcinoma in situ with necrosis.  -  Tumor involves multiple cores and measures 5 mm in maximum extent in a single core 2.  Breast, left, needle core biopsy, 2 o'clock:  -  Fibrocystic changes, including sclerosing adenosis with calcifications  -  No atypia, in situ or invasive malignancy identified 3.  Breast, left, needle core biopsy, 11:30 o'clock:  -  Fragmented intraductal papilloma and dilated duct  -  No atypia, in situ or invasive malignancy identified  HER2: Positive  Ration of HER2/CEN17 signals: 2.23  Average HER2 copy #/cell: 4.23 ER: Negative 0% PR: Negative 0% Ki67: 50%   11/13/2019 Breast MRI   BILATERAL BREAST MRI WITH AND WITHOUT CONTRAST: 1.  Question abnormal lymph node in the left axilla.   2, 1.5 cm enhancement in the left breast approximately 6 o'clock position with associated biopsy clip consistent with recent biopsy cancer.  3. 0.6 cm masslike enhancement in the retroareolar left breast with associated biopsy clip artifact correlating to recently biopsy proven papilloma.  12/08/2019 Initial Diagnosis   Breast cancer ([place])   12/23/2019 Pathology Results   1.  Breast, lumpectomy, left:             -  Invasive and in situ ductal carcinoma, 1.2 cm             -  Margins not involved             -  Invasive carcinoma 0.2 cm from superior  margin             -  Ductal carcinoma in situ 0.1 cm from inferior margin             -  Biopsy site and biopsy clip 2.  Lymph node, sentinel, biopsy, left:             -  One benign, reactive lymph node with focal biopsy site reaction (0/1)  Tumor size: 1.2 x 1.0 x 0.8 cm Histologic grade: 3   01/01/2020 Cancer Staging   Staging form: Breast, AJCC 8th Edition - Clinical stage from 01/01/2020: Stage IA (cT1c, cN0(sn), cM0, G3, ER-, PR-, HER2+) - Signed by Derwood Kaplan, MD on 01/09/2020   01/29/2020 - 01/07/2021 Chemotherapy   Patient is on Treatment Plan : BREAST  Docetaxel + Carboplatin + Trastuzumab + Pertuzumab  (TCHP) q21d (COMPLETED TCHP ON 05/20/2020) / Trastuzumab + Pertuzumab q21d      06/15/2020 - 07/21/2020 Radiation Therapy   5000 cGy in 25 fractions    10/14/2020 Mammogram   DIAGNOSTIC BILATERAL MAMMOGRAM: No evidence of malignancy in either breast.    10/29/2020 Echocardiogram   Overall left ventricular systolic function is normal with, an EF between 55 - 60 %.    12/01/2021 Imaging   Bilateral diagnostic mammogram    EXAM:  DIGITAL DIAGNOSTIC BILATERAL MAMMOGRAM WITH TOMOSYNTHESIS  TECHNIQUE:  Bilateral digital diagnostic mammography and breast tomosynthesis  was performed.  COMPARISON: Previous exam(s).  ACR Breast Density Category b: There are scattered areas of  fibroglandular density.  FINDINGS:  Post lumpectomy changes on the left stable from the most recent  prior study. There is vague superficial density in the medial right  breast reflecting the current inflamed skin lesion. There are no  breast masses, areas of significant asymmetry, areas of nonsurgical  architectural distortion or suspicious calcifications.  IMPRESSION:  1. No evidence of new or recurrent breast carcinoma.  2. Benign post lumpectomy changes on the left.  RECOMMENDATION:  Screening mammogram in one year.(Code:SM-B-01Y)        INTERVAL HISTORY:  Brittney Burgess is here today for  repeat clinical assessment.  She denies any changes in her breasts.  She states she is undergoing physical therapy for her neuropathy, with some improvement.  She is able to get around her house without her rolling walker.  She continues to use this when she is outside the home.  She reports occasional nausea.  She continues ondansetron as needed.  She denies fevers or chills. She denies pain. Her appetite is good. Her weight has increased 2 pounds over last 2 months .  REVIEW OF SYSTEMS:  Review of Systems  Constitutional:  Negative for appetite change, chills, fatigue, fever and unexpected weight change.  HENT:   Negative for lump/mass, mouth sores and sore throat.   Respiratory:  Negative for cough and shortness of breath.   Cardiovascular:  Negative for chest pain and leg swelling.  Gastrointestinal:  Negative for abdominal pain, constipation, diarrhea, nausea and vomiting.  Endocrine: Negative  for hot flashes.  Genitourinary:  Negative for difficulty urinating, dysuria, frequency and hematuria.   Musculoskeletal:  Negative for arthralgias, back pain and myalgias.  Skin:  Negative for rash.  Neurological:  Negative for dizziness and headaches.  Hematological:  Negative for adenopathy. Does not bruise/bleed easily.  Psychiatric/Behavioral:  Negative for depression and sleep disturbance. The patient is not nervous/anxious.      VITALS:  Blood pressure (!) 148/87, pulse (!) 103, temperature 98 F (36.7 C), temperature source Oral, resp. rate 20, height _0  (1.753 m), weight 188 lb 6.4 oz (85.5 kg), SpO2 100 %.  Wt Readings from Last 3 Encounters:  01/19/22 188 lb 6.4 oz (85.5 kg)  11/19/21 186 lb 4.8 oz (84.5 kg)  09/30/21 187 lb (84.8 kg)    Body mass index is 27.82 kg/m.  Performance status (ECOG): 1 - Symptomatic but completely ambulatory  PHYSICAL EXAM:  Physical Exam Vitals and nursing note reviewed.  Constitutional:      General: She is not in acute distress.     Appearance: Normal appearance.  HENT:     Head: Normocephalic and atraumatic.     Mouth/Throat:     Mouth: Mucous membranes are moist.     Pharynx: Oropharynx is clear. No oropharyngeal exudate or posterior oropharyngeal erythema.  Eyes:     General: No scleral icterus.    Extraocular Movements: Extraocular movements intact.     Conjunctiva/sclera: Conjunctivae normal.     Pupils: Pupils are equal, round, and reactive to light.  Cardiovascular:     Rate and Rhythm: Normal rate and regular rhythm.     Heart sounds: Normal heart sounds. No murmur heard.    No friction rub. No gallop.  Pulmonary:     Effort: Pulmonary effort is normal.     Breath sounds: Normal breath sounds. No wheezing, rhonchi or rales.  Chest:  Breasts:    Right: Normal. No swelling, bleeding, inverted nipple, mass, nipple discharge, skin change or tenderness.     Left: Normal. No swelling, bleeding, inverted nipple, mass, nipple discharge, skin change or tenderness.  Abdominal:     General: There is no distension.     Palpations: Abdomen is soft. There is no hepatomegaly, splenomegaly or mass.     Tenderness: There is no abdominal tenderness.  Musculoskeletal:        General: Normal range of motion.     Cervical back: Normal range of motion and neck supple. No tenderness.     Right lower leg: No edema.     Left lower leg: No edema.  Lymphadenopathy:     Cervical: No cervical adenopathy.     Upper Body:     Right upper body: No supraclavicular or axillary adenopathy.     Left upper body: No supraclavicular or axillary adenopathy.     Lower Body: No right inguinal adenopathy. No left inguinal adenopathy.  Skin:    General: Skin is warm and dry.     Coloration: Skin is not jaundiced.     Findings: No rash.  Neurological:     Mental Status: She is alert and oriented to person, place, and time.     Cranial Nerves: No cranial nerve deficit.  Psychiatric:        Mood and Affect: Mood normal.        Behavior:  Behavior normal.        Thought Content: Thought content normal.     LABS:      Latest Ref Rng &  Units 01/19/2022    9:55 AM 11/19/2021   12:00 AM 07/20/2021   12:00 AM  CBC  WBC 4.0 - 10.5 K/uL 9.8  10.3     7.6      Hemoglobin 12.0 - 15.0 g/dL 14.7  14.5     13.5      Hematocrit 36.0 - 46.0 % 44.7  42     41      Platelets 150 - 400 K/uL 290  225     190         This result is from an external source.      Latest Ref Rng & Units 01/19/2022    9:55 AM 11/19/2021   12:00 AM 09/30/2021   10:49 AM  CMP  Glucose 70 - 99 mg/dL 364     BUN 6 - 20 mg/dL 16  16       Creatinine 0.44 - 1.00 mg/dL 1.12  0.9       Sodium 135 - 145 mmol/L 136  136       Potassium 3.5 - 5.1 mmol/L 4.1  3.9       Chloride 98 - 111 mmol/L 100  101       CO2 22 - 32 mmol/L 26  25       Calcium 8.9 - 10.3 mg/dL 9.8  9.7       Total Protein 6.5 - 8.1 g/dL 8.2  8.3     8.1   Total Bilirubin 0.3 - 1.2 mg/dL 0.5     Alkaline Phos 38 - 126 U/L 139  171       AST 15 - 41 U/L 22  27       ALT 0 - 44 U/L 25  22          This result is from an external source.     No results found for: "CEA1", "CEA" / No results found for: "CEA1", "CEA" No results found for: "PSA1" No results found for: "ZOX096" No results found for: "CAN125"  Lab Results  Component Value Date   ALBUMINELP 4.7 09/30/2021   A1GS 0.4 (H) 09/30/2021   A2GS 0.7 09/30/2021   BETS 0.5 09/30/2021   BETA2SER 0.4 09/30/2021   GAMS 1.5 09/30/2021   SPEI  09/30/2021     Comment:     Normal Serum Protein Electrophoresis Pattern. No abnormal protein bands (M-protein) detected.    No results found for: "TIBC", "FERRITIN", "IRONPCTSAT" No results found for: "LDH"  STUDIES:    Exam(s): 1101-0003 MAM/MAM DIGITAL W/TOMO DIAG B  CLINICAL DATA: Annual surveillance. History of breast carcinoma  treated with lumpectomy chemotherapy and radiation therapy in 2021.  Patient also has a history of skin boils, hereditary, likely  hidradenitis  suppurativa, with 1 recently draining coil along the  medial right breast which was marked for imaging.  EXAM:  DIGITAL DIAGNOSTIC BILATERAL MAMMOGRAM WITH TOMOSYNTHESIS  TECHNIQUE:  Bilateral digital diagnostic mammography and breast tomosynthesis  was performed.  COMPARISON: Previous exam(s).  ACR Breast Density Category b: There are scattered areas of  fibroglandular density.  FINDINGS:  Post lumpectomy changes on the left stable from the most recent  prior study. There is vague superficial density in the medial right  breast reflecting the current inflamed skin lesion. There are no  breast masses, areas of significant asymmetry, areas of nonsurgical  architectural distortion or suspicious calcifications.  IMPRESSION:  1. No evidence of new or recurrent breast carcinoma.  2. Benign post  lumpectomy changes on the left.  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   Hypertension    Neuromuscular disorder ([place])    neuropathy bilateral feet and lower legs   Port-A-Cath in place    right upper chest   Walker as ambulation aid     Past Surgical History:  Procedure Laterality Date   BIOPSY THYROID  04/2019   2.9 cm mass, benign   BREAST LUMPECTOMY Left 12/25/2019   HYSTERECTOMY ABDOMINAL WITH SALPINGO-OOPHORECTOMY Bilateral 2004   due to menorrhagia   MYOMECTOMY  1995   x6 for fibroids   TOOTH EXTRACTION N/A 05/14/2021   Procedure: DENTAL RESTORATION/EXTRACTIONS;  Surgeon: Diona Browner, DMD;  Location: [facility];  Service: Oral Surgery;  Laterality: N/A;    Family History  Problem Relation Age of Onset   Cancer Mother    Alcohol abuse Father    Diabetes Brother    Cancer Maternal Aunt     Social History:  reports that she has  been smoking cigarettes. She has a 32.00 pack-year smoking history. She has never used smokeless tobacco. She reports that she does not drink alcohol and does not use drugs.The patient is alone today.  Allergies:  Allergies  Allergen Reactions   Metformin And Related Other (See Comments)    Twitching, nausea    Current Medications: Current Outpatient Medications  Medication Sig Dispense Refill   amLODipine (NORVASC) 5 MG tablet Take 1 tablet (5 mg total) by mouth daily. 30 tablet 0   Blood Glucose Monitoring Suppl (TRUE METRIX METER) DEVI dx 11.9 (Medicaid preferred) USE FOR TESTING BLOOD SUGARS when Fasting daily     DULoxetine (CYMBALTA) 30 MG capsule Take 1 capsule (30 mg total) by mouth at bedtime for 7 days, THEN 2 capsules (60 mg total) at bedtime. (Patient not taking: Reported on 11/19/2021) 187 capsule 0   gabapentin (NEURONTIN) 300 MG capsule Take 1 capsule (300 mg total) by mouth 2 (two) times daily for 7 days, THEN 1 capsule (300 mg total) in the morning for 7 days. (Patient taking differently: Not taking) 180 capsule 5   GNP ULTICARE PEN NEEDLES 32G X 4 MM MISC      hydrochlorothiazide (HYDRODIURIL) 25 MG tablet TAKE 1 TABLET (25 MG TOTAL) BY MOUTH DAILY. 90 tablet 1   insulin glargine (LANTUS) 100 UNIT/ML Solostar Pen Inject 10-40 Units into the skin See admin instructions. Take 10 units in the morning and 40 units at bedtime     JANUVIA 100 MG tablet Take 100 mg by mouth daily.     KLOR-CON M20 20 MEQ tablet TAKE 1 TABLET BY MOUTH TWICE A DAY 180 tablet 1   losartan (COZAAR) 50 MG tablet TAKE 1 TABLET BY MOUTH EVERY DAY 30 tablet 2   ondansetron (ZOFRAN) 4 MG tablet Take 4 mg by mouth every 8 (eight) hours as needed for nausea or vomiting.     oxyCODONE (OXY IR/ROXICODONE) 5 MG immediate release tablet Take 1 tablet (5 mg total) by mouth every 6 (six) hours as needed for severe pain. 120 tablet 0   TRUE METRIX BLOOD GLUCOSE TEST test strip SMARTSIG:1 Via Meter Daily PRN      TRUEplus Lancets 28G MISC  SMARTSIG:1 Topical Daily PRN     VICTOZA 18 MG/3ML SOPN Inject into the skin. (Patient not taking: Reported on 11/19/2021)     No current facility-administered medications for this visit.

## 2022-01-19 NOTE — Assessment & Plan Note (Addendum)
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  She was treated with lumpectomy she completed chemotherapy at the end of April 2022, followed by radiation therapy completed in June.  She completed maintenance tastuzumab/pertuzumab for a total of 1 year of HER2 targeted therapy in December 2022.    Bilateral diagnostic mammogram in November did not reveal any evidence of malignancy.  Her labs are stable.  We flushed her port today.  I will plan to see her back in 3 months with a CBC and comprehensive metabolic panel for repeat clinical assessment.

## 2022-01-19 NOTE — Telephone Encounter (Signed)
Patient notified and voiced understanding.

## 2022-01-26 ENCOUNTER — Other Ambulatory Visit: Payer: Self-pay

## 2022-01-26 DIAGNOSIS — G620 Drug-induced polyneuropathy: Secondary | ICD-10-CM

## 2022-01-26 MED ORDER — OXYCODONE HCL 5 MG PO TABS: 5.0000 mg | ORAL_TABLET | Freq: Four times a day (QID) | ORAL | 0 refills | Status: DC | PRN

## 2022-02-14 ENCOUNTER — Encounter: Payer: Self-pay | Admitting: Hematology and Oncology

## 2022-02-21 ENCOUNTER — Inpatient Hospital Stay: Payer: 59 | Attending: Hematology and Oncology

## 2022-02-21 VITALS — BP 157/85 | HR 101 | Temp 98.0°F | Resp 18 | Ht 69.0 in | Wt 191.0 lb

## 2022-02-21 DIAGNOSIS — C50512 Malignant neoplasm of lower-outer quadrant of left female breast: Secondary | ICD-10-CM | POA: Insufficient documentation

## 2022-02-21 DIAGNOSIS — E119 Type 2 diabetes mellitus without complications: Secondary | ICD-10-CM | POA: Diagnosis not present

## 2022-02-21 DIAGNOSIS — Z170 Estrogen receptor positive status [ER+]: Secondary | ICD-10-CM | POA: Diagnosis not present

## 2022-02-21 DIAGNOSIS — Z452 Encounter for adjustment and management of vascular access device: Secondary | ICD-10-CM | POA: Diagnosis not present

## 2022-02-21 DIAGNOSIS — I10 Essential (primary) hypertension: Secondary | ICD-10-CM | POA: Insufficient documentation

## 2022-02-21 DIAGNOSIS — Z79899 Other long term (current) drug therapy: Secondary | ICD-10-CM | POA: Diagnosis not present

## 2022-02-21 DIAGNOSIS — F17210 Nicotine dependence, cigarettes, uncomplicated: Secondary | ICD-10-CM | POA: Diagnosis not present

## 2022-02-21 DIAGNOSIS — Z794 Long term (current) use of insulin: Secondary | ICD-10-CM | POA: Diagnosis not present

## 2022-02-21 DIAGNOSIS — E876 Hypokalemia: Secondary | ICD-10-CM

## 2022-02-21 MED ORDER — SODIUM CHLORIDE 0.9% FLUSH
10.0000 mL | INTRAVENOUS | Status: DC | PRN
  Administered 2022-02-21: 10 mL

## 2022-02-21 MED ORDER — HEPARIN SOD (PORK) LOCK FLUSH 100 UNIT/ML IV SOLN
500.0000 [IU] | Freq: Once | INTRAVENOUS | Status: AC | PRN
  Administered 2022-02-21: 500 [IU]

## 2022-02-21 NOTE — Telephone Encounter (Signed)
done

## 2022-02-22 ENCOUNTER — Other Ambulatory Visit: Payer: Self-pay

## 2022-02-22 DIAGNOSIS — G620 Drug-induced polyneuropathy: Secondary | ICD-10-CM

## 2022-02-22 MED ORDER — OXYCODONE HCL 5 MG PO TABS: 5.0000 mg | ORAL_TABLET | Freq: Four times a day (QID) | ORAL | 0 refills | Status: DC | PRN

## 2022-02-23 ENCOUNTER — Other Ambulatory Visit: Payer: Self-pay | Admitting: Neurology

## 2022-02-23 DIAGNOSIS — G620 Drug-induced polyneuropathy: Secondary | ICD-10-CM

## 2022-02-23 NOTE — Telephone Encounter (Signed)
done

## 2022-03-30 ENCOUNTER — Other Ambulatory Visit: Payer: Self-pay

## 2022-03-30 DIAGNOSIS — G620 Drug-induced polyneuropathy: Secondary | ICD-10-CM

## 2022-03-30 MED ORDER — OXYCODONE HCL 5 MG PO TABS: 5.0000 mg | ORAL_TABLET | Freq: Four times a day (QID) | ORAL | 0 refills | Status: DC | PRN

## 2022-04-15 NOTE — Progress Notes (Incomplete)
[facility]  [address] [place],  [place]  [address] [phone]  Clinic Day:  04/15/2022  Referring physician: Carolynne Edouard, MD  ASSESSMENT & PLAN:  Assessment & Plan: 1. 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 She completed chemotherapy at the end of April 2022 and radiation therapy in June.  We have completed maintenance tastuzumab/pertuzumab for a total of 1 year of HER2 targeted therapy in December 2022. Most recent ECHO from September was normal with an EF between 55-60%.    2.  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 She has persistent numbness, which had recently worsened. We increased her gabapentin 600 mg to TID, and she has noted mild improvement. She also continues oxycodone 10 mg. I will send in a new prescription today.  3.  Renal insufficiency secondary to dehydration, slightly improved. She knows to push fluids.   Plan: She recently completed 17 cycles of maintenance trastuzumab/pertuzumab in December. She will continue gabapentin 600 mg TID. She may also continue oxycodone 10 mg for breakthrough pain, and I have agreed to refill this today. We will see her back in 2 months with CBC and CMP for repeat examination and port flush. After that appointment, we will go to 3 month follow up. She wishes to keep her port in and so we will continue to flush this every 3 months. The patient understands the plans discussed today and is in agreement with them.  She knows to contact our office if she develops concerns prior to her next appointment.  I provided 20 minutes of face-to-face time during this this encounter and > 50% was spent counseling as documented under my assessment and plan.    [facility] [address] [place] [address] Dept:  [phone] Dept Fax: [phone]   No orders of the defined types were placed in this encounter.   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  Current Treatment:  Surveillance   HISTORY OF PRESENT ILLNESS:   Oncology History  Malignant neoplasm of lower-outer quadrant of left female breast ([facility])  09/09/2019 Mammogram   SCREENING BILATERAL MAMMOGRAM: Further evaluation is suggested for possible mass with calcifications and possible dilated ducts in the left breast.   10/01/2019 Mammogram   DIAGNOSTIC UNILATERAL LEFT MAMMOGRAM AND LEFT BREAST ULTRASOUND: A 1.6 cm elongated mass containing pleomorphic calcifications in the 7 o'clock position of the left breast, highly suspicious for breast malignancy.  Tissue sampling is recommended.  A 5 mm hypoechoic, indeterminate mass containing 2 benign-appearing calcificaitons in the 2 o'clock position of the left breast.  Several retroareolar ecstatic ducts containing internal debris or possibly tissue.   10/21/2019 Pathology Results   1.  Breast, left, needle core biopsy, 5 o'clock, 5 cmfn:  -  Invasive ductal carcinoma, grade 3, and high grade ductal carcinoma in situ with necrosis.  -  Tumor involves multiple cores and measures 5 mm in maximum extent in a single core 2.  Breast, left, needle core biopsy, 2 o'clock:  -  Fibrocystic changes, including sclerosing adenosis with calcifications  -  No atypia, in situ or invasive malignancy identified 3.  Breast, left, needle core biopsy, 11:30 o'clock:  -  Fragmented intraductal papilloma and dilated duct  -  No atypia, in  situ or invasive malignancy identified  HER2: Positive  Ration of HER2/CEN17 signals: 2.23  Average HER2 copy #/cell: 4.23 ER: Negative 0% PR: Negative 0% Ki67: 50%   11/13/2019 Breast MRI   BILATERAL BREAST MRI WITH AND WITHOUT CONTRAST: 1.  Question abnormal lymph node in the left  axilla.   2, 1.5 cm enhancement in the left breast approximately 6 o'clock position with associated biopsy clip consistent with recent biopsy cancer.  3. 0.6 cm masslike enhancement in the retroareolar left breast with associated biopsy clip artifact correlating to recently biopsy proven papilloma.   12/08/2019 Initial Diagnosis   Breast cancer ([facility])   12/23/2019 Pathology Results   1.  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  Lymph node, sentinel, biopsy, left:             -  One benign, reactive lymph node with focal biopsy site reaction (0/1)  Tumor size: 1.2 x 1.0 x 0.8 cm Histologic grade: 3   01/01/2020 Cancer Staging   Staging form: Breast, AJCC 8th Edition - Clinical stage from 01/01/2020: Stage IA (cT1c, cN0(sn), cM0, G3, ER-, PR-, HER2+) - Signed by Derwood Kaplan, MD on 01/09/2020   01/29/2020 - 01/07/2021 Chemotherapy   Patient is on Treatment Plan : BREAST  Docetaxel + Carboplatin + Trastuzumab + Pertuzumab  (TCHP) q21d (COMPLETED TCHP ON 05/20/2020) / Trastuzumab + Pertuzumab q21d      06/15/2020 - 07/21/2020 Radiation Therapy   5000 cGy in 25 fractions    10/14/2020 Mammogram   DIAGNOSTIC BILATERAL MAMMOGRAM: No evidence of malignancy in either breast.    10/29/2020 Echocardiogram   Overall left ventricular systolic function is normal with, an EF between 55 - 60 %.    12/01/2021 Imaging   Bilateral diagnostic mammogram    EXAM:  DIGITAL DIAGNOSTIC BILATERAL MAMMOGRAM WITH TOMOSYNTHESIS  TECHNIQUE:  Bilateral digital diagnostic mammography and breast tomosynthesis  was performed.  COMPARISON: Previous exam(s).  ACR Breast Density Category b: There are scattered areas of  fibroglandular density.   FINDINGS:  Post lumpectomy changes on the left stable from the most recent  prior study. There is vague superficial density in the medial right  breast reflecting the current inflamed skin lesion. There are no  breast masses, areas of significant asymmetry, areas of nonsurgical  architectural distortion or suspicious calcifications.  IMPRESSION:  1. No evidence of new or recurrent breast carcinoma.  2. Benign post lumpectomy changes on the left.  RECOMMENDATION:  Screening mammogram in one year.(Code:[id number])       INTERVAL HISTORY:  Brittney Burgess is here for routine follow up for stage IB (T1cN0M0) HER2 positive invasive ductal carcinoma and high grade ductal carcinoma in situ.   She denies signs of infection such as sore throat, sinus drainage, cough, or urinary symptoms.  She denies fevers or recurrent chills. She denies pain. She denies nausea, vomiting, chest pain, dyspnea or cough. Her weight {Weight change:10426}.  She states that she is doing fairly well. She has notes mild improvement in her neuropathy of the bilateral feet with increasing the gabapentin 600 mg TID. She continues oxycodone 10 mg for breakthrough pain, and requests a new prescription. I will do so but stressed that she needs to use this only as absolutely necessary. She has also been placed on Metformin 500 mg BID. She has had some back pain, which she rates as a 6/10 today. Blood counts are unremarkable except for a BUN of 21 and a creatinine of 1.2, improved.   Her  appetite is good, and she has gained 9 pounds since her last visit.  She denies fever, chills or other signs of infection.  She denies nausea, vomiting, bowel issues, or abdominal pain.  She denies sore throat, cough, dyspnea, or chest pain.  REVIEW OF SYSTEMS:  Review of Systems  Constitutional: Negative.  Negative for appetite change, chills, diaphoresis, fatigue, fever and unexpected weight change.  HENT:  Negative.  Negative for hearing loss,  lump/mass, mouth sores, nosebleeds, sore throat, tinnitus, trouble swallowing and voice change.   Eyes: Negative.  Negative for eye problems and icterus.  Respiratory: Negative.  Negative for chest tightness, cough, hemoptysis, shortness of breath and wheezing.   Cardiovascular: Negative.  Negative for chest pain, leg swelling and palpitations.  Gastrointestinal: Negative.  Negative for abdominal distention, abdominal pain, blood in stool, constipation, diarrhea, nausea, rectal pain and vomiting.  Endocrine: Negative.   Genitourinary: Negative.  Negative for bladder incontinence, difficulty urinating, dyspareunia, dysuria, frequency, hematuria, menstrual problem, nocturia, pelvic pain, vaginal bleeding and vaginal discharge.   Musculoskeletal:  Positive for back pain (lower). Negative for arthralgias, flank pain, gait problem, myalgias, neck pain and neck stiffness.  Skin: Negative.  Negative for itching, rash and wound.  Neurological:  Positive for numbness (neuropathy of the hands and feet, severe). Negative for dizziness, extremity weakness, gait problem, headaches, light-headedness, seizures and speech difficulty.  Hematological: Negative.  Negative for adenopathy. Does not bruise/bleed easily.  Psychiatric/Behavioral: Negative.  Negative for confusion, decreased concentration, depression, sleep disturbance and suicidal ideas. The patient is not nervous/anxious.      VITALS:  There were no vitals taken for this visit.  Wt Readings from Last 3 Encounters:  02/21/22 191 lb (86.6 kg)  01/19/22 188 lb 6.4 oz (85.5 kg)  11/19/21 186 lb 4.8 oz (84.5 kg)    There is no height or weight on file to calculate BMI.  Performance status (ECOG): 1 - Symptomatic but completely ambulatory  PHYSICAL EXAM:  Physical Exam Vitals and nursing note reviewed.  Constitutional:      General: She is not in acute distress.    Appearance: Normal appearance. She is normal weight. She is not ill-appearing,  toxic-appearing or diaphoretic.  HENT:     Head: Normocephalic and atraumatic.     Right Ear: Tympanic membrane, ear canal and external ear normal. There is no impacted cerumen.     Left Ear: Tympanic membrane, ear canal and external ear normal. There is no impacted cerumen.     Nose: Nose normal. No congestion or rhinorrhea.     Mouth/Throat:     Mouth: Mucous membranes are moist.     Pharynx: Oropharynx is clear. No oropharyngeal exudate or posterior oropharyngeal erythema.  Eyes:     General: No scleral icterus.       Right eye: No discharge.        Left eye: No discharge.     Extraocular Movements:  Extraocular movements intact.     Conjunctiva/sclera: Conjunctivae normal.     Pupils: Pupils are equal, round, and reactive to light.  Neck:     Vascular: No carotid bruit.  Cardiovascular:     Rate and Rhythm: Regular rhythm. Tachycardia present.     Pulses: Normal pulses.     Heart sounds: Normal heart sounds. No murmur heard.    No friction rub. No gallop.  Pulmonary:     Effort: Pulmonary effort is normal. No respiratory distress.     Breath sounds: Normal breath sounds. No stridor. No wheezing, rhonchi or rales.  Chest:     Chest wall: No tenderness.     Comments: Hyperpigmentation of the left breast with mild radiation changes and mild tenderness Abdominal:     General: Bowel sounds are normal. There is no distension.     Palpations: Abdomen is soft. There is no hepatomegaly, splenomegaly or mass.     Tenderness: There is no abdominal tenderness. There is no right CVA tenderness, left CVA tenderness, guarding or rebound.     Hernia: No hernia is present.  Musculoskeletal:        General: No swelling, tenderness, deformity or signs of injury. Normal range of motion.     Cervical back: Normal range of motion and neck supple. No rigidity or tenderness.     Right lower leg: No edema.     Left lower leg: No edema.  Lymphadenopathy:     Cervical: No cervical adenopathy.   Skin:    General: Skin is warm and dry.     Coloration: Skin is not jaundiced or pale.     Findings: No bruising, erythema, lesion or rash.  Neurological:     General: No focal deficit present.     Mental Status: She is alert and oriented to person, place, and time. Mental status is at baseline.     Cranial Nerves: No cranial nerve deficit.     Sensory: No sensory deficit.     Motor: No weakness.     Coordination: Coordination normal.     Gait: Gait normal.     Deep Tendon Reflexes: Reflexes normal.  Psychiatric:        Mood and Affect: Mood normal.        Behavior: Behavior normal.        Thought Content: Thought content normal.        Judgment: Judgment normal.    LABS:      Latest Ref Rng & Units 01/19/2022    9:55 AM 11/19/2021   12:00 AM 07/20/2021   12:00 AM  CBC  WBC 4.0 - 10.5 K/uL 9.8  10.3     7.6      Hemoglobin 12.0 - 15.0 g/dL 14.7  14.5     13.5      Hematocrit 36.0 - 46.0 % 44.7  42     41      Platelets 150 - 400 K/uL 290  225     190         This result is from an external source.       Latest Ref Rng & Units 01/19/2022    9:55 AM 11/19/2021   12:00 AM 09/30/2021   10:49 AM  CMP  Glucose 70 - 99 mg/dL 364     BUN 6 - 20 mg/dL 16  16       Creatinine 0.44 - 1.00 mg/dL 1.12  0.9  Sodium 135 - 145 mmol/L 136  136       Potassium 3.5 - 5.1 mmol/L 4.1  3.9       Chloride 98 - 111 mmol/L 100  101       CO2 22 - 32 mmol/L 26  25       Calcium 8.9 - 10.3 mg/dL 9.8  9.7       Total Protein 6.5 - 8.1 g/dL 8.2  8.3     8.1   Total Bilirubin 0.3 - 1.2 mg/dL 0.5     Alkaline Phos 38 - 126 U/L 139  171       AST 15 - 41 U/L 22  27       ALT 0 - 44 U/L 25  22          This result is from an external source.    Component Ref Range & Units 4 mo ago 6 mo ago  Total Protein 6.3 - 8.2 g/dL 8.3 Abnormal  8.1 R    STUDIES:  No results found.    HISTORY:   Allergies:  Allergies  Allergen Reactions   Metformin And Related Other (See Comments)     Twitching, nausea    Current Medications: Current Outpatient Medications  Medication Sig Dispense Refill   amLODipine (NORVASC) 5 MG tablet Take 1 tablet (5 mg total) by mouth daily. 30 tablet 0   Blood Glucose Monitoring Suppl (TRUE METRIX METER) DEVI dx 11.9 (Medicaid preferred) USE FOR TESTING BLOOD SUGARS when Fasting daily     DULoxetine (CYMBALTA) 30 MG capsule Take 1 capsule (30 mg total) by mouth at bedtime for 7 days, THEN 2 capsules (60 mg total) at bedtime. (Patient not taking: Reported on 11/19/2021) 187 capsule 0   gabapentin (NEURONTIN) 300 MG capsule Take 1 capsule (300 mg total) by mouth 2 (two) times daily for 7 days, THEN 1 capsule (300 mg total) in the morning for 7 days. (Patient taking differently: Not taking) 180 capsule 5   GNP ULTICARE PEN NEEDLES 32G X 4 MM MISC      hydrochlorothiazide (HYDRODIURIL) 25 MG tablet TAKE 1 TABLET (25 MG TOTAL) BY MOUTH DAILY. 90 tablet 1   insulin glargine (LANTUS) 100 UNIT/ML Solostar Pen Inject 10-40 Units into the skin See admin instructions. Take 10 units in the morning and 40 units at bedtime     JANUVIA 100 MG tablet Take 100 mg by mouth daily.     KLOR-CON M20 20 MEQ tablet TAKE 1 TABLET BY MOUTH TWICE A DAY 180 tablet 1   losartan (COZAAR) 50 MG tablet TAKE 1 TABLET BY MOUTH EVERY DAY 30 tablet 2   ondansetron (ZOFRAN) 4 MG tablet Take 4 mg by mouth every 8 (eight) hours as needed for nausea or vomiting.     oxyCODONE (OXY IR/ROXICODONE) 5 MG immediate release tablet Take 1 tablet (5 mg total) by mouth every 6 (six) hours as needed for severe pain. 120 tablet 0   TRUE METRIX BLOOD GLUCOSE TEST test strip SMARTSIG:1 Via Meter Daily PRN     TRUEplus Lancets 28G MISC SMARTSIG:1 Topical Daily PRN     VICTOZA 18 MG/3ML SOPN Inject into the skin. (Patient not taking: Reported on 11/19/2021)     No current facility-administered medications for this visit.   I,Jasmine M Lassiter,acting as a scribe for Derwood Kaplan, MD.,have  documented all relevant documentation on the behalf of Derwood Kaplan, MD,as directed by  Derwood Kaplan, MD  while in the presence of Derwood Kaplan, MD.  I have reviewed this report as typed by the medical scribe, and it is complete and accurate.

## 2022-04-20 ENCOUNTER — Inpatient Hospital Stay: Payer: 59

## 2022-04-20 ENCOUNTER — Inpatient Hospital Stay: Payer: 59 | Admitting: Oncology

## 2022-05-02 ENCOUNTER — Other Ambulatory Visit: Payer: Self-pay

## 2022-05-02 DIAGNOSIS — T451X5A Adverse effect of antineoplastic and immunosuppressive drugs, initial encounter: Secondary | ICD-10-CM

## 2022-05-03 ENCOUNTER — Encounter: Payer: Self-pay | Admitting: Hematology and Oncology

## 2022-05-03 ENCOUNTER — Other Ambulatory Visit: Payer: Self-pay | Admitting: Hematology and Oncology

## 2022-05-03 ENCOUNTER — Other Ambulatory Visit: Payer: Self-pay | Admitting: Neurology

## 2022-05-03 DIAGNOSIS — G620 Drug-induced polyneuropathy: Secondary | ICD-10-CM

## 2022-05-03 DIAGNOSIS — E876 Hypokalemia: Secondary | ICD-10-CM

## 2022-05-03 MED ORDER — OXYCODONE HCL 5 MG PO TABS: 5.0000 mg | ORAL_TABLET | Freq: Four times a day (QID) | ORAL | 0 refills | Status: DC | PRN

## 2022-05-17 ENCOUNTER — Ambulatory Visit: Payer: 59 | Admitting: Hematology and Oncology

## 2022-05-17 ENCOUNTER — Other Ambulatory Visit: Payer: 59

## 2022-06-01 NOTE — Progress Notes (Signed)
ADDENDUM: Her blood sugar was alarmingly high at 436 with a sodium of 130, probably spurious.  Creatinine is slightly higher at 1.19 and the rest is fairly normal.  I have asked her to follow-up with her primary care provider regarding control of her diabetes, as this will only worsen her neuropathy.   [facility]  [address] [place],  [place]  [address] [phone]  Clinic Day: 06/07/2022  Referring physician: Shannan [name], MD  ASSESSMENT & PLAN:  Assessment & Plan: 1. 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 She completed chemotherapy at the end of April 2022 and radiation therapy in June.  We have completed maintenance tastuzumab/pertuzumab for a total of 1 year of HER2 targeted therapy in December 2022. Her last ECHO from September, 2022 was normal with an EF between 55-60%.    2.  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 She has persistent numbness and paresthesias. She takes 2 Gabapentin 300mg  pills twice a day. I instructed her to take 2 in the morning and 3 at bedtime for a total of 5 daily. She takes about 2 oxycodone 5mg  pills a day. I will refill her Gabapentin and Oxycodone today.   3.  Renal insufficiency secondary to dehydration, slightly improved. She knows to push fluids.  4.  Poorly controlled diabetes.  This is contributing to her worsening severe neuropathy.  Plan: She completed 17 cycles of maintenance trastuzumab and pertuzumab in December. She had a fall recently and injured her left arm it is still swollen and tender. She takes 2 Gabapentin 300mg  pills twice a day. I informed her to take 2 in the morning and 3 at bedtime for a total of 5 daily. She takes about 2 oxycodone 5mg  pills a day. I will refill her Gabapentin and Oxycodone today. She will see Dr. Lequita Halt this Friday 06/10/2022 and her diabetic  doctor on 06/15/2022. Her labs today are pending and she will receive a port flush before she leaves. I will see her back in 4 months for reevaluation and port flush. She will discuss with Dr. Lequita Halt about a port removal, which she can do at any time. The patient understands the plans discussed today and is in agreement with them.  She knows to contact our office if she develops concerns prior to her next appointment.  I provided 20 minutes of face-to-face time during this this encounter and > 50% was spent counseling as documented under my assessment and plan.    [facility] AT [facility] [address] [place] [address] Dept: [phone] Dept Fax: [phone]   No orders of the defined types were placed in this encounter.   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  Current Treatment:  Surveillance  HISTORY OF PRESENT ILLNESS:   Oncology History  Malignant neoplasm of lower-outer quadrant of left female breast (HCC)  09/09/2019 Mammogram   SCREENING BILATERAL MAMMOGRAM: Further evaluation is suggested for possible mass with calcifications and possible dilated ducts in the left breast.   10/01/2019 Mammogram   DIAGNOSTIC UNILATERAL LEFT MAMMOGRAM AND LEFT BREAST ULTRASOUND: A 1.6 cm elongated mass containing pleomorphic calcifications in the 7 o'clock position of the left breast, highly suspicious for breast malignancy.  Tissue sampling is recommended.  A 5 mm hypoechoic,  indeterminate mass containing 2 benign-appearing calcificaitons in the 2 o'clock position of the left breast.  Several retroareolar ecstatic ducts containing internal debris or possibly tissue.   10/21/2019 Pathology Results   1.  Breast, left, needle core biopsy, 5 o'clock, 5 cmfn:  -  Invasive ductal carcinoma, grade 3, and high grade ductal carcinoma in situ with necrosis.  -   Tumor involves multiple cores and measures 5 mm in maximum extent in a single core 2.  Breast, left, needle core biopsy, 2 o'clock:  -  Fibrocystic changes, including sclerosing adenosis with calcifications  -  No atypia, in situ or invasive malignancy identified 3.  Breast, left, needle core biopsy, 11:30 o'clock:  -  Fragmented intraductal papilloma and dilated duct  -  No atypia, in situ or invasive malignancy identified  HER2: Positive  Ration of HER2/CEN17 signals: 2.23  Average HER2 copy #/cell: 4.23 ER: Negative 0% PR: Negative 0% Ki67: 50%   11/13/2019 Breast MRI   BILATERAL BREAST MRI WITH AND WITHOUT CONTRAST: 1.  Question abnormal lymph node in the left axilla.   2, 1.5 cm enhancement in the left breast approximately 6 o'clock position with associated biopsy clip consistent with recent biopsy cancer.  3. 0.6 cm masslike enhancement in the retroareolar left breast with associated biopsy clip artifact correlating to recently biopsy proven papilloma.   12/08/2019 Initial Diagnosis   Breast cancer (HCC)   12/23/2019 Pathology Results   1.  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  Lymph node, sentinel, biopsy, left:             -  One benign, reactive lymph node with focal biopsy site reaction (0/1)  Tumor size: 1.2 x 1.0 x 0.8 cm Histologic grade: 3   01/01/2020 Cancer Staging   Staging form: Breast, AJCC 8th Edition - Clinical stage from 01/01/2020: Stage IA (cT1c, cN0(sn), cM0, G3, ER-, PR-, HER2+) - Signed by Dellia Beckwith, MD on 01/09/2020   01/29/2020 - 01/07/2021 Chemotherapy   Patient is on Treatment Plan : BREAST  Docetaxel + Carboplatin + Trastuzumab + Pertuzumab  (TCHP) q21d (COMPLETED TCHP ON  05/20/2020) / Trastuzumab + Pertuzumab q21d      06/15/2020 - 07/21/2020 Radiation Therapy   5000 cGy in 25 fractions    10/14/2020 Mammogram   DIAGNOSTIC BILATERAL MAMMOGRAM: No evidence of malignancy in either breast.    10/29/2020 Echocardiogram   Overall left ventricular systolic function is normal with, an EF between 55 - 60 %.    12/01/2021 Imaging   Bilateral diagnostic mammogram    EXAM:  DIGITAL DIAGNOSTIC BILATERAL MAMMOGRAM WITH TOMOSYNTHESIS  TECHNIQUE:  Bilateral digital diagnostic mammography and breast tomosynthesis  was performed.  COMPARISON: Previous exam(s).  ACR Breast Density Category b: There are scattered areas of  fibroglandular density.  FINDINGS:  Post lumpectomy changes on the left stable from the most recent  prior study. There is vague superficial  density in the medial right  breast reflecting the current inflamed skin lesion. There are no  breast masses, areas of significant asymmetry, areas of nonsurgical  architectural distortion or suspicious calcifications.  IMPRESSION:  1. No evidence of new or recurrent breast carcinoma.  2. Benign post lumpectomy changes on the left.  RECOMMENDATION:  Screening mammogram in one year.(Code:[id number]-B-01Y)       INTERVAL HISTORY:  Brittney Burgess is here for routine follow up for stage IB (T1cN0M0) HER2 positive invasive ductal carcinoma and high grade ductal carcinoma in situ. She completed 1 year of maintenance trastuzumab and pertuzumab in December, 2023. Patient states that she feels ok and still complains of chronic neuropathy in her hands and feet. She had a fall recently and injured her left arm it is still swollen and tender. She takes 2 Gabapentin 300mg  pills twice a day. I instructed her to take 2 in the morning and 3 at bedtime for a total of 5 daily. She takes about 2 oxycodone 5 mg pills a day. I will refill her Gabapentin and Oxycodone today. She will see Dr. Lequita Halt this Friday 06/10/2022 and her diabetic doctor on  06/15/2022. Her labs today are pending and she will receive a port flush before she leaves. I will see her back in 4 months for reevaluation and port flush. She will discuss with Dr. Lequita Halt about a port removal, which she can do at any time. She denies signs of infection such as sore throat, sinus drainage, cough, or urinary symptoms.  She denies fevers or recurrent chills. She denies pain. She denies nausea, vomiting, chest pain, dyspnea or cough. Her appetite is good and her weight has decreased 2 pounds over last 3.5 months .  REVIEW OF SYSTEMS:  Review of Systems  Constitutional: Negative.  Negative for appetite change, chills, diaphoresis, fatigue, fever and unexpected weight change.  HENT:  Negative.  Negative for hearing loss, lump/mass, mouth sores, nosebleeds, sore throat, tinnitus, trouble swallowing and voice change.   Eyes: Negative.  Negative for eye problems and icterus.  Respiratory: Negative.  Negative for chest tightness, cough, hemoptysis, shortness of breath and wheezing.   Cardiovascular: Negative.  Negative for chest pain, leg swelling and palpitations.  Gastrointestinal: Negative.  Negative for abdominal distention, abdominal pain, blood in stool, constipation, diarrhea, nausea, rectal pain and vomiting.  Endocrine: Negative.   Genitourinary: Negative.  Negative for bladder incontinence, difficulty urinating, dyspareunia, dysuria, frequency, hematuria, menstrual problem, nocturia, pelvic pain, vaginal bleeding and vaginal discharge.   Musculoskeletal: Negative.  Negative for arthralgias, back pain (lower), flank pain, gait problem, myalgias, neck pain and neck stiffness.       Left elbow pain from a recent fall.   Skin: Negative.  Negative for itching, rash and wound.  Neurological:  Positive for numbness (neuropathy of the hands and feet, severe). Negative for dizziness, extremity weakness, gait problem, headaches, light-headedness, seizures and speech difficulty.   Hematological: Negative.  Negative for adenopathy. Does not bruise/bleed easily.  Psychiatric/Behavioral: Negative.  Negative for confusion, decreased concentration, depression, sleep disturbance and suicidal ideas. The patient is not nervous/anxious.      VITALS:  Blood pressure (!) 145/89, pulse (!) 101, temperature 98.7 F (37.1 C), temperature source Oral, resp. rate 18, height 5\' 9"  (1.753 m), weight 189 lb 11.2 oz (86 kg), SpO2 98 %.  Wt Readings from Last 3 Encounters:  06/07/22 189 lb 11.2 oz (86 kg)  02/21/22 191 lb (86.6 kg)  01/19/22 188 lb 6.4 oz (85.5 kg)  Body mass index is 28.01 kg/m.  Performance status (ECOG): 1 - Symptomatic but completely ambulatory  PHYSICAL EXAM:  Physical Exam Vitals and nursing note reviewed.  Constitutional:      General: She is not in acute distress.    Appearance: Normal appearance. She is normal weight. She is not ill-appearing, toxic-appearing or diaphoretic.  HENT:     Head: Normocephalic and atraumatic.     Right Ear: Tympanic membrane, ear canal and external ear normal. There is no impacted cerumen.     Left Ear: Tympanic membrane, ear canal and external ear normal. There is no impacted cerumen.     Nose: Nose normal. No congestion or rhinorrhea.     Mouth/Throat:     Mouth: Mucous membranes are moist.     Pharynx: Oropharynx is clear. No oropharyngeal exudate or posterior oropharyngeal erythema.  Eyes:     General: No scleral icterus.       Right eye: No discharge.        Left eye: No discharge.     Extraocular Movements: Extraocular movements intact.     Conjunctiva/sclera: Conjunctivae normal.     Pupils: Pupils are equal, round, and reactive to light.  Neck:     Vascular: No carotid bruit.  Cardiovascular:     Rate and Rhythm: Regular rhythm. Tachycardia present.     Pulses: Normal pulses.     Heart sounds: Normal heart sounds. No murmur heard.    No friction rub. No gallop.  Pulmonary:     Effort: Pulmonary effort  is normal. No respiratory distress.     Breath sounds: Normal breath sounds. No stridor. No wheezing, rhonchi or rales.  Chest:     Chest wall: No tenderness.     Comments: Scar in the upper inner quadrant of the right breast from prior boils that have healed and 2 at 9 o'clock in the right breast.  Couple healed scars in the left breast from prior boils that have healed. Upper outer quadrant of the left breast she has a barely visible scar which is well healed.  No masses in either breasts.  Abdominal:     General: Bowel sounds are normal. There is no distension.     Palpations: Abdomen is soft. There is no hepatomegaly, splenomegaly or mass.     Tenderness: There is no abdominal tenderness. There is no right CVA tenderness, left CVA tenderness, guarding or rebound.     Hernia: No hernia is present.  Musculoskeletal:        General: No swelling, tenderness, deformity or signs of injury. Normal range of motion.     Cervical back: Normal range of motion and neck supple. No rigidity or tenderness.     Right lower leg: No edema.     Left lower leg: No edema.  Lymphadenopathy:     Cervical: No cervical adenopathy.  Skin:    General: Skin is warm and dry.     Coloration: Skin is not jaundiced or pale.     Findings: No bruising, erythema or lesion.  Neurological:     General: No focal deficit present.     Mental Status: She is alert and oriented to person, place, and time. Mental status is at baseline.     Cranial Nerves: No cranial nerve deficit.     Sensory: No sensory deficit.     Motor: No weakness.     Coordination: Coordination normal.     Gait: Gait normal.     Deep  Tendon Reflexes: Reflexes normal.  Psychiatric:        Mood and Affect: Mood normal.        Behavior: Behavior normal.        Thought Content: Thought content normal.        Judgment: Judgment normal.     LABS:      Latest Ref Rng & Units 06/07/2022   10:21 AM 01/19/2022    9:55 AM 11/19/2021   12:00 AM  CBC   WBC 4.0 - 10.5 K/uL 10.6  9.8  10.3      Hemoglobin 12.0 - 15.0 g/dL 16.1  09.6  04.5      Hematocrit 36.0 - 46.0 % 42.1  44.7  42      Platelets 150 - 400 K/uL 256  290  225         This result is from an external source.      Latest Ref Rng & Units 06/07/2022   10:21 AM 01/19/2022    9:55 AM 11/19/2021   12:00 AM  CMP  Glucose 70 - 99 mg/dL 409  811    BUN 6 - 20 mg/dL 18  16  16       Creatinine 0.44 - 1.00 mg/dL 9.14  7.82  0.9      Sodium 135 - 145 mmol/L 130  136  136      Potassium 3.5 - 5.1 mmol/L 3.6  4.1  3.9      Chloride 98 - 111 mmol/L 97  100  101      CO2 22 - 32 mmol/L 25  26  25       Calcium 8.9 - 10.3 mg/dL 9.4  9.8  9.7      Total Protein 6.5 - 8.1 g/dL 8.1  8.2  8.3      Total Bilirubin 0.3 - 1.2 mg/dL 0.4  0.5    Alkaline Phos 38 - 126 U/L 176  139  171      AST 15 - 41 U/L 20  22  27       ALT 0 - 44 U/L 26  25  22          This result is from an external source.   STUDIES:  No results found.    HISTORY:   Allergies:  Allergies  Allergen Reactions   Metformin And Related Other (See Comments)    Twitching, nausea    Current Medications: Current Outpatient Medications  Medication Sig Dispense Refill   amLODipine (NORVASC) 5 MG tablet Take 1 tablet (5 mg total) by mouth daily. 30 tablet 0   Blood Glucose Monitoring Suppl (TRUE METRIX METER) DEVI dx 11.9 (Medicaid preferred) USE FOR TESTING BLOOD SUGARS when Fasting daily     DULoxetine (CYMBALTA) 30 MG capsule TAKE 1 CAPSULE BY MOUTH EVERY DAY AT BEDTIME FOR 7 DAYS. THEN 2 CAPSULES AT BEDTIME 187 capsule 0   gabapentin (NEURONTIN) 300 MG capsule Take 3 capsules (900 mg total) by mouth at bedtime AND 2 capsules (600 mg total) every morning. 150 capsule 5   GNP ULTICARE PEN NEEDLES 32G X 4 MM MISC      hydrochlorothiazide (HYDRODIURIL) 25 MG tablet TAKE 1 TABLET (25 MG TOTAL) BY MOUTH DAILY. 90 tablet 1   insulin glargine (LANTUS) 100 UNIT/ML Solostar Pen Inject 10-40 Units into the skin See admin  instructions. Take 10 units in the morning and 40 units at bedtime     JANUVIA 100 MG tablet Take 100  mg by mouth daily.     losartan (COZAAR) 50 MG tablet TAKE 1 TABLET BY MOUTH EVERY DAY 30 tablet 2   ondansetron (ZOFRAN) 4 MG tablet Take 4 mg by mouth every 8 (eight) hours as needed for nausea or vomiting.     oxyCODONE (OXY IR/ROXICODONE) 5 MG immediate release tablet Take 1 tablet (5 mg total) by mouth every 6 (six) hours as needed for severe pain. 120 tablet 0   potassium chloride SA (KLOR-CON M) 20 MEQ tablet TAKE 1 TABLET BY MOUTH TWICE A DAY 180 tablet 1   TRUE METRIX BLOOD GLUCOSE TEST test strip SMARTSIG:1 Via Meter Daily PRN     TRUEplus Lancets 28G MISC SMARTSIG:1 Topical Daily PRN     Current Facility-Administered Medications  Medication Dose Route Frequency Provider Last Rate Last Admin   sodium chloride flush (NS) 0.9 % injection 10 mL  10 mL Intracatheter PRN Ilda Basset A, NP   10 mL at 06/07/22 1139     I,Jasmine M Lassiter,acting as a Neurosurgeon for Dellia Beckwith, MD.,have documented all relevant documentation on the behalf of Dellia Beckwith, MD,as directed by  Dellia Beckwith, MD while in the presence of Dellia Beckwith, MD.   I have reviewed this report as typed by the medical scribe, and it is complete and accurate.

## 2022-06-07 ENCOUNTER — Telehealth: Payer: Self-pay | Admitting: Oncology

## 2022-06-07 ENCOUNTER — Other Ambulatory Visit: Payer: Self-pay | Admitting: Oncology

## 2022-06-07 ENCOUNTER — Encounter: Payer: Self-pay | Admitting: Oncology

## 2022-06-07 ENCOUNTER — Inpatient Hospital Stay (INDEPENDENT_AMBULATORY_CARE_PROVIDER_SITE_OTHER): Payer: 59 | Admitting: Oncology

## 2022-06-07 ENCOUNTER — Inpatient Hospital Stay: Payer: 59 | Attending: Oncology

## 2022-06-07 VITALS — BP 145/89 | HR 101 | Temp 98.7°F | Resp 18 | Ht 69.0 in | Wt 189.7 lb

## 2022-06-07 DIAGNOSIS — Z923 Personal history of irradiation: Secondary | ICD-10-CM | POA: Diagnosis not present

## 2022-06-07 DIAGNOSIS — C50512 Malignant neoplasm of lower-outer quadrant of left female breast: Secondary | ICD-10-CM | POA: Diagnosis not present

## 2022-06-07 DIAGNOSIS — Z794 Long term (current) use of insulin: Secondary | ICD-10-CM | POA: Diagnosis not present

## 2022-06-07 DIAGNOSIS — E1165 Type 2 diabetes mellitus with hyperglycemia: Secondary | ICD-10-CM | POA: Diagnosis not present

## 2022-06-07 DIAGNOSIS — Z171 Estrogen receptor negative status [ER-]: Secondary | ICD-10-CM

## 2022-06-07 DIAGNOSIS — Z79899 Other long term (current) drug therapy: Secondary | ICD-10-CM | POA: Diagnosis not present

## 2022-06-07 DIAGNOSIS — G620 Drug-induced polyneuropathy: Secondary | ICD-10-CM | POA: Insufficient documentation

## 2022-06-07 DIAGNOSIS — T451X5D Adverse effect of antineoplastic and immunosuppressive drugs, subsequent encounter: Secondary | ICD-10-CM | POA: Insufficient documentation

## 2022-06-07 DIAGNOSIS — Z853 Personal history of malignant neoplasm of breast: Secondary | ICD-10-CM | POA: Insufficient documentation

## 2022-06-07 DIAGNOSIS — Z9221 Personal history of antineoplastic chemotherapy: Secondary | ICD-10-CM | POA: Insufficient documentation

## 2022-06-07 DIAGNOSIS — E876 Hypokalemia: Secondary | ICD-10-CM | POA: Diagnosis not present

## 2022-06-07 DIAGNOSIS — T451X5A Adverse effect of antineoplastic and immunosuppressive drugs, initial encounter: Secondary | ICD-10-CM | POA: Diagnosis not present

## 2022-06-07 LAB — CBC WITH DIFFERENTIAL (CANCER CENTER ONLY)
Abs Immature Granulocytes: 0.05 10*3/uL (ref 0.00–0.07)
Basophils Absolute: 0 10*3/uL (ref 0.0–0.1)
Basophils Relative: 0 %
Eosinophils Absolute: 0.1 10*3/uL (ref 0.0–0.5)
Eosinophils Relative: 1 %
HCT: 42.1 % (ref 36.0–46.0)
Hemoglobin: 14.3 g/dL (ref 12.0–15.0)
Immature Granulocytes: 1 %
Lymphocytes Relative: 17 %
Lymphs Abs: 1.8 10*3/uL (ref 0.7–4.0)
MCH: 27.5 pg (ref 26.0–34.0)
MCHC: 34 g/dL (ref 30.0–36.0)
MCV: 81 fL (ref 80.0–100.0)
Monocytes Absolute: 0.6 10*3/uL (ref 0.1–1.0)
Monocytes Relative: 5 %
Neutro Abs: 8.1 10*3/uL — ABNORMAL HIGH (ref 1.7–7.7)
Neutrophils Relative %: 76 %
Platelet Count: 256 10*3/uL (ref 150–400)
RBC: 5.2 MIL/uL — ABNORMAL HIGH (ref 3.87–5.11)
RDW: 14 % (ref 11.5–15.5)
WBC Count: 10.6 10*3/uL — ABNORMAL HIGH (ref 4.0–10.5)
nRBC: 0 % (ref 0.0–0.2)

## 2022-06-07 LAB — CMP (CANCER CENTER ONLY)
ALT: 26 U/L (ref 0–44)
AST: 20 U/L (ref 15–41)
Albumin: 4 g/dL (ref 3.5–5.0)
Alkaline Phosphatase: 176 U/L — ABNORMAL HIGH (ref 38–126)
Anion gap: 8 (ref 5–15)
BUN: 18 mg/dL (ref 6–20)
CO2: 25 mmol/L (ref 22–32)
Calcium: 9.4 mg/dL (ref 8.9–10.3)
Chloride: 97 mmol/L — ABNORMAL LOW (ref 98–111)
Creatinine: 1.19 mg/dL — ABNORMAL HIGH (ref 0.44–1.00)
GFR, Estimated: 53 mL/min — ABNORMAL LOW (ref >60)
Glucose, Bld: 436 mg/dL — ABNORMAL HIGH (ref 70–99)
Potassium: 3.6 mmol/L (ref 3.5–5.1)
Sodium: 130 mmol/L — ABNORMAL LOW (ref 135–145)
Total Bilirubin: 0.4 mg/dL (ref 0.3–1.2)
Total Protein: 8.1 g/dL (ref 6.5–8.1)

## 2022-06-07 MED ORDER — OXYCODONE HCL 5 MG PO TABS
5.0000 mg | ORAL_TABLET | Freq: Four times a day (QID) | ORAL | 0 refills | Status: DC | PRN
Start: 2022-06-07 — End: 2022-07-12

## 2022-06-07 MED ORDER — SODIUM CHLORIDE 0.9% FLUSH
10.0000 mL | INTRAVENOUS | Status: DC | PRN
  Administered 2022-06-07: 10 mL

## 2022-06-07 MED ORDER — GABAPENTIN 300 MG PO CAPS: ORAL_CAPSULE | ORAL | 5 refills | Status: DC

## 2022-06-07 MED ORDER — HEPARIN SOD (PORK) LOCK FLUSH 100 UNIT/ML IV SOLN
500.0000 [IU] | Freq: Once | INTRAVENOUS | Status: AC | PRN
  Administered 2022-06-07: 500 [IU]

## 2022-06-07 NOTE — Telephone Encounter (Signed)
06/07/22 Next appt scheduled and confirmed with patient

## 2022-06-10 DIAGNOSIS — Z171 Estrogen receptor negative status [ER-]: Secondary | ICD-10-CM | POA: Diagnosis not present

## 2022-06-10 DIAGNOSIS — C50512 Malignant neoplasm of lower-outer quadrant of left female breast: Secondary | ICD-10-CM | POA: Diagnosis not present

## 2022-06-15 ENCOUNTER — Telehealth: Payer: Self-pay

## 2022-06-15 DIAGNOSIS — M25522 Pain in left elbow: Secondary | ICD-10-CM | POA: Diagnosis not present

## 2022-06-15 DIAGNOSIS — Z87891 Personal history of nicotine dependence: Secondary | ICD-10-CM | POA: Diagnosis not present

## 2022-06-15 DIAGNOSIS — Z0131 Encounter for examination of blood pressure with abnormal findings: Secondary | ICD-10-CM | POA: Diagnosis not present

## 2022-06-15 DIAGNOSIS — I10 Essential (primary) hypertension: Secondary | ICD-10-CM | POA: Diagnosis not present

## 2022-06-15 DIAGNOSIS — Z1389 Encounter for screening for other disorder: Secondary | ICD-10-CM | POA: Diagnosis not present

## 2022-06-15 DIAGNOSIS — G629 Polyneuropathy, unspecified: Secondary | ICD-10-CM | POA: Diagnosis not present

## 2022-06-15 DIAGNOSIS — E119 Type 2 diabetes mellitus without complications: Secondary | ICD-10-CM | POA: Diagnosis not present

## 2022-06-15 NOTE — Telephone Encounter (Signed)
-----   Message from Dellia Beckwith, MD sent at 06/14/2022  2:37 PM EDT ----- Regarding: RE: Gabapentin okay ----- Message ----- From: Dyane Dustman, RN Sent: 06/14/2022   2:11 PM EDT To: Dellia Beckwith, MD Subject: Gabapentin                                     Pt called to let you know that she could not tolerate the increase in gabapentin so she went back to the 100mg  PO TID.Marland Kitchen

## 2022-06-17 DIAGNOSIS — Z0130 Encounter for examination of blood pressure without abnormal findings: Secondary | ICD-10-CM | POA: Diagnosis not present

## 2022-06-17 DIAGNOSIS — E1165 Type 2 diabetes mellitus with hyperglycemia: Secondary | ICD-10-CM | POA: Diagnosis not present

## 2022-06-21 ENCOUNTER — Encounter: Payer: Self-pay | Admitting: Hematology and Oncology

## 2022-07-12 ENCOUNTER — Other Ambulatory Visit: Payer: Self-pay

## 2022-07-12 DIAGNOSIS — G620 Drug-induced polyneuropathy: Secondary | ICD-10-CM

## 2022-07-12 MED ORDER — OXYCODONE HCL 5 MG PO TABS
5.0000 mg | ORAL_TABLET | Freq: Four times a day (QID) | ORAL | 0 refills | Status: DC | PRN
Start: 2022-07-12 — End: 2022-08-08

## 2022-07-12 MED ORDER — GABAPENTIN 300 MG PO CAPS
300.0000 mg | ORAL_CAPSULE | Freq: Three times a day (TID) | ORAL | 1 refills | Status: DC
Start: 2022-07-12 — End: 2022-08-08

## 2022-07-27 ENCOUNTER — Other Ambulatory Visit: Payer: Self-pay

## 2022-07-27 NOTE — Telephone Encounter (Signed)
It is too soon to refill pain medications. Gabapentin refill is due 08/05/22, oxycodone is due 11th.

## 2022-08-08 ENCOUNTER — Other Ambulatory Visit: Payer: Self-pay

## 2022-08-08 DIAGNOSIS — T451X5A Adverse effect of antineoplastic and immunosuppressive drugs, initial encounter: Secondary | ICD-10-CM

## 2022-08-09 ENCOUNTER — Encounter: Payer: Self-pay | Admitting: Hematology and Oncology

## 2022-08-09 MED ORDER — OXYCODONE HCL 5 MG PO TABS
5.0000 mg | ORAL_TABLET | Freq: Four times a day (QID) | ORAL | 0 refills | Status: DC | PRN
Start: 2022-08-09 — End: 2022-09-26

## 2022-08-09 MED ORDER — GABAPENTIN 300 MG PO CAPS
300.0000 mg | ORAL_CAPSULE | Freq: Three times a day (TID) | ORAL | 1 refills | Status: DC
Start: 2022-08-09 — End: 2022-12-26

## 2022-09-26 ENCOUNTER — Other Ambulatory Visit: Payer: Self-pay

## 2022-09-26 DIAGNOSIS — G620 Drug-induced polyneuropathy: Secondary | ICD-10-CM

## 2022-09-27 ENCOUNTER — Encounter: Payer: Self-pay | Admitting: Hematology and Oncology

## 2022-09-27 ENCOUNTER — Other Ambulatory Visit: Payer: Self-pay | Admitting: Oncology

## 2022-09-27 DIAGNOSIS — E876 Hypokalemia: Secondary | ICD-10-CM

## 2022-09-27 MED ORDER — OXYCODONE HCL 5 MG PO TABS
5.0000 mg | ORAL_TABLET | Freq: Four times a day (QID) | ORAL | 0 refills | Status: DC | PRN
Start: 2022-09-27 — End: 2022-10-27

## 2022-10-06 ENCOUNTER — Other Ambulatory Visit: Payer: Self-pay | Admitting: Hematology and Oncology

## 2022-10-06 DIAGNOSIS — Z171 Estrogen receptor negative status [ER-]: Secondary | ICD-10-CM

## 2022-10-11 ENCOUNTER — Other Ambulatory Visit: Payer: 59

## 2022-10-11 ENCOUNTER — Ambulatory Visit: Payer: 59 | Admitting: Oncology

## 2022-10-17 ENCOUNTER — Inpatient Hospital Stay: Payer: 59 | Attending: Hematology and Oncology

## 2022-10-17 ENCOUNTER — Encounter: Payer: Self-pay | Admitting: Hematology and Oncology

## 2022-10-17 ENCOUNTER — Telehealth: Payer: Self-pay

## 2022-10-17 ENCOUNTER — Inpatient Hospital Stay (INDEPENDENT_AMBULATORY_CARE_PROVIDER_SITE_OTHER): Payer: 59 | Admitting: Hematology and Oncology

## 2022-10-17 VITALS — BP 107/67 | HR 98 | Temp 98.4°F | Resp 18 | Ht 69.0 in | Wt 197.1 lb

## 2022-10-17 DIAGNOSIS — E1165 Type 2 diabetes mellitus with hyperglycemia: Secondary | ICD-10-CM | POA: Diagnosis not present

## 2022-10-17 DIAGNOSIS — E876 Hypokalemia: Secondary | ICD-10-CM | POA: Diagnosis not present

## 2022-10-17 DIAGNOSIS — T451X5A Adverse effect of antineoplastic and immunosuppressive drugs, initial encounter: Secondary | ICD-10-CM

## 2022-10-17 DIAGNOSIS — C50512 Malignant neoplasm of lower-outer quadrant of left female breast: Secondary | ICD-10-CM | POA: Diagnosis not present

## 2022-10-17 DIAGNOSIS — E1140 Type 2 diabetes mellitus with diabetic neuropathy, unspecified: Secondary | ICD-10-CM | POA: Diagnosis not present

## 2022-10-17 DIAGNOSIS — Z794 Long term (current) use of insulin: Secondary | ICD-10-CM

## 2022-10-17 DIAGNOSIS — G620 Drug-induced polyneuropathy: Secondary | ICD-10-CM

## 2022-10-17 DIAGNOSIS — Z171 Estrogen receptor negative status [ER-]: Secondary | ICD-10-CM

## 2022-10-17 DIAGNOSIS — Z923 Personal history of irradiation: Secondary | ICD-10-CM | POA: Insufficient documentation

## 2022-10-17 DIAGNOSIS — Z87891 Personal history of nicotine dependence: Secondary | ICD-10-CM | POA: Diagnosis not present

## 2022-10-17 DIAGNOSIS — R5383 Other fatigue: Secondary | ICD-10-CM | POA: Diagnosis not present

## 2022-10-17 DIAGNOSIS — R739 Hyperglycemia, unspecified: Secondary | ICD-10-CM | POA: Diagnosis not present

## 2022-10-17 DIAGNOSIS — I10 Essential (primary) hypertension: Secondary | ICD-10-CM | POA: Diagnosis not present

## 2022-10-17 LAB — CMP (CANCER CENTER ONLY)
ALT: 29 U/L (ref 0–44)
AST: 21 U/L (ref 15–41)
Albumin: 3.9 g/dL (ref 3.5–5.0)
Alkaline Phosphatase: 145 U/L — ABNORMAL HIGH (ref 38–126)
Anion gap: 14 (ref 5–15)
BUN: 22 mg/dL — ABNORMAL HIGH (ref 6–20)
CO2: 23 mmol/L (ref 22–32)
Calcium: 9.5 mg/dL (ref 8.9–10.3)
Chloride: 95 mmol/L — ABNORMAL LOW (ref 98–111)
Creatinine: 1.23 mg/dL — ABNORMAL HIGH (ref 0.44–1.00)
GFR, Estimated: 51 mL/min — ABNORMAL LOW (ref >60)
Glucose, Bld: 550 mg/dL (ref 70–99)
Potassium: 3.6 mmol/L (ref 3.5–5.1)
Sodium: 132 mmol/L — ABNORMAL LOW (ref 135–145)
Total Bilirubin: 0.7 mg/dL (ref 0.3–1.2)
Total Protein: 7.7 g/dL (ref 6.5–8.1)

## 2022-10-17 LAB — CBC WITH DIFFERENTIAL (CANCER CENTER ONLY)
Abs Immature Granulocytes: 0.06 10*3/uL (ref 0.00–0.07)
Basophils Absolute: 0 10*3/uL (ref 0.0–0.1)
Basophils Relative: 1 %
Eosinophils Absolute: 0.1 10*3/uL (ref 0.0–0.5)
Eosinophils Relative: 2 %
HCT: 41.1 % (ref 36.0–46.0)
Hemoglobin: 13.8 g/dL (ref 12.0–15.0)
Immature Granulocytes: 1 %
Lymphocytes Relative: 26 %
Lymphs Abs: 2.3 10*3/uL (ref 0.7–4.0)
MCH: 28.6 pg (ref 26.0–34.0)
MCHC: 33.6 g/dL (ref 30.0–36.0)
MCV: 85.3 fL (ref 80.0–100.0)
Monocytes Absolute: 0.5 10*3/uL (ref 0.1–1.0)
Monocytes Relative: 6 %
Neutro Abs: 5.8 10*3/uL (ref 1.7–7.7)
Neutrophils Relative %: 64 %
Platelet Count: 238 10*3/uL (ref 150–400)
RBC: 4.82 MIL/uL (ref 3.87–5.11)
RDW: 14.5 % (ref 11.5–15.5)
WBC Count: 8.9 10*3/uL (ref 4.0–10.5)
nRBC: 0 % (ref 0.0–0.2)

## 2022-10-17 MED ORDER — SODIUM CHLORIDE 0.9% FLUSH
10.0000 mL | INTRAVENOUS | Status: DC | PRN
  Administered 2022-10-17: 10 mL

## 2022-10-17 MED ORDER — HEPARIN SOD (PORK) LOCK FLUSH 100 UNIT/ML IV SOLN
500.0000 [IU] | Freq: Once | INTRAVENOUS | Status: AC | PRN
  Administered 2022-10-17: 500 [IU]

## 2022-10-17 NOTE — Telephone Encounter (Signed)
Patient made aware her blood sugar was 550 and she needed to go to the emergency room. Patient voiced she felt fine and would check her blood sugar again . I stayed on hold with her , she reported a blood sugar of 590. She voiced her friend was there with her and he would drive her to the emergency room now. Labs faxed to PCP with instructions patient to go to ED.

## 2022-10-17 NOTE — Progress Notes (Signed)
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  She was treated with lumpectomy she completed chemotherapy at the end of April 2022, followed by radiation therapy completed in June.  She completed maintenance tastuzumab/pertuzumab for a total of 1 year of HER2 targeted therapy in December 2022.    Bilateral diagnostic mammogram in November 2023 did not reveal any evidence of malignancy.  We flushed her port today.  She hopes to have this removed after her mammogram in November.  We will plan to see her back in 6 months for repeat examination.  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  Dr. Loleta Chance recommended tapering off the gabapentin, so I will have her decrease her gabapentin 300 mg to once a day for a week then stop.  We will schedule her to see Dr. Loleta Chance again for further recommendations.  Unfortunately, her poorly controlled diabetes is likely contributing to the neuropathy.  Diabetes mellitus with diabetic neuropathy (HCC) Poorly controlled diabetes.  She states she continues insulin and Januvia as prescribed.  Her glucose is 550 today, so we will refer her to her local ER.  She will need to be seen by her PCP for continued management.    The patient understands the plans discussed today and is in agreement with them.  She knows to contact our office if she develops concerns prior to her next appointment.   I provided 30 minutes of face-to-face time during this encounter and > 50% was spent counseling as documented under  my assessment and plan.    Adah Perl, PA-C  [facility] AT [facility] [address] [place] [address] Dept: [phone] Dept Fax: [phone]   No orders of the defined types were placed in this encounter.     CHIEF COMPLAINT:  CC: Stage IB HER2 receptor positive left breast cancer  Current Treatment: Surveillance  HISTORY OF PRESENT ILLNESS:  Brittney Burgess is a 62 y.o. female with stage IB (T1 cN0 M0) left breast cancer diagnosed in September 2021.  This was found on screening mammogram in August 2021, which revealed a mass with calcifications, a second asymmetry and possible dilated ducts within the left breast.  Right breast was negative.  Diagnostic left mammogram and left breast ultrasound revealed a 1.6 cm elongated mass containing pleomorphic calcifications at 7 o'clock, a 5 mm hypoechoic indeterminate mass containing 2 benign-appearing calcification at 2 o'clock, and several retroareolar ecstatic ducts containing internal debris or possible tissue.  Ultrasound guided biopsy in September revealed invasive ductal carcinoma, grade 3, and high grade ductal carcinoma in situ with necrosis at 5 o'clock.  Estrogen and progesterone receptors were negative and HER2 positive.  Ki-67 was 50% biopsy at 2 o'clock was consistent with fibrocystic changes including sclerosing adenosis with calcifications.  No atypia, in situ or invasive malignancy. Biopsy at 11:30 o'clock revealed a fragmented intraductal papilloma and dilated duct.  No atypia, in situ or invasive malignancy identified.   She was treated with left lumpectomy and sentinel lymph node biopsy in November. Surgical pathology  revealed, a 1.2 cm, grade 3, invasive with ductal carcinoma in situ ductal.  Margins not involved.  One sentinel lymph node was negative for malignancy. Status post emergency hysterectomy and bilateral salpingo oophorectomy in 2004 due to menorrhagia.  She  was unsure if she was given hormone replacement therapy.  She had pre-existing neuropathy of her feet from diabetes.  She was treated with adjuvant TCHP (docetaxel/carboplatin/trastuzumab/pertuzumab) completed in April 2022.  She received 5 out of 6 doses of docetaxel due to severely worsening neuropathy.  She received adjuvant radiation therapy completed in June 2022.  She received maintenance trastuzumab/pertuzumab for a total of 1 year of HER2 targeted therapy completed in December 2022.  Bilateral diagnostic mammogram in September 2022 did not reveal any evidence of malignancy.  She continued to severe neuropathy with numbness and paresthesias, for which she was given gabapentin and oxycodone 5 mg every 6 hours as needed.  The gabapentin was titrated up without improvement.  She was referred to neurology for the persistent neuropathy and saw Dr. Jacquelyne Balint and saw him in October 2023.  He recommended an EMG.  He placed her on duloxetine 30 mg daily 1 week, then titrated up to 60 mg daily.  He recommended tapering her off the gabapentin.  He also recommended an MRI head as she reported left sided weakness, but she did not go for that, nor did she follow-up with him.  We have continued the gabapentin, but she has not stayed on the recommended doses.  We have also continued the oxycodone.  Bilateral diagnostic mammogram in November 2023 did not reveal any evidence of malignancy.    Oncology History  Malignant neoplasm of lower-outer quadrant of left female breast (HCC)  09/09/2019 Mammogram   SCREENING BILATERAL MAMMOGRAM: Further evaluation is suggested for possible mass with calcifications and possible dilated ducts in the left breast.   10/01/2019 Mammogram   DIAGNOSTIC UNILATERAL LEFT MAMMOGRAM AND LEFT BREAST ULTRASOUND: A 1.6 cm elongated mass containing pleomorphic calcifications in the 7 o'clock position of the left breast, highly suspicious for breast malignancy.  Tissue sampling is  recommended.  A 5 mm hypoechoic, indeterminate mass containing 2 benign-appearing calcificaitons in the 2 o'clock position of the left breast.  Several retroareolar ecstatic ducts containing internal debris or possibly tissue.   10/21/2019 Pathology Results   1.  Breast, left, needle core biopsy, 5 o'clock, 5 cmfn:  -  Invasive ductal carcinoma, grade 3, and high grade ductal carcinoma in situ with necrosis.  -  Tumor involves multiple cores and measures 5 mm in maximum extent in a single core 2.  Breast, left, needle core biopsy, 2 o'clock:  -  Fibrocystic changes, including sclerosing adenosis with calcifications  -  No atypia, in situ or invasive malignancy identified 3.  Breast, left, needle core biopsy, 11:30 o'clock:  -  Fragmented intraductal papilloma and dilated duct  -  No atypia, in situ or invasive malignancy identified  HER2: Positive  Ration of HER2/CEN17 signals: 2.23  Average HER2 copy #/cell: 4.23 ER: Negative 0% PR: Negative 0% Ki67: 50%   11/13/2019 Breast MRI   BILATERAL BREAST MRI WITH AND WITHOUT CONTRAST: 1.  Question abnormal lymph node in the left axilla.   2, 1.5 cm enhancement in the left breast approximately 6 o'clock position with associated biopsy clip consistent with recent biopsy cancer.  3. 0.6 cm masslike enhancement in the retroareolar left breast with associated biopsy clip artifact correlating to recently biopsy proven papilloma.   12/08/2019 Initial  Diagnosis   Breast cancer (HCC)   12/23/2019 Pathology Results   1.  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  Lymph node, sentinel, biopsy, left:             -  One benign, reactive lymph node with focal biopsy site reaction  (0/1)  Tumor size: 1.2 x 1.0 x 0.8 cm Histologic grade: 3   01/01/2020 Cancer Staging   Staging form: Breast, AJCC 8th Edition - Clinical stage from 01/01/2020: Stage IA (cT1c, cN0(sn), cM0, G3, ER-, PR-, HER2+) - Signed by Dellia Beckwith, MD on 01/09/2020   01/29/2020 - 01/07/2021 Chemotherapy   Patient is on Treatment Plan : BREAST  Docetaxel + Carboplatin + Trastuzumab + Pertuzumab  (TCHP) q21d (COMPLETED TCHP ON 05/20/2020) / Trastuzumab + Pertuzumab q21d      06/15/2020 - 07/21/2020 Radiation Therapy   5000 cGy in 25 fractions    10/14/2020 Mammogram   DIAGNOSTIC BILATERAL MAMMOGRAM: No evidence of malignancy in either breast.    10/29/2020 Echocardiogram   Overall left ventricular systolic function is normal with, an EF between 55 - 60 %.    12/01/2021 Imaging   Bilateral diagnostic mammogram    EXAM:  DIGITAL DIAGNOSTIC BILATERAL MAMMOGRAM WITH TOMOSYNTHESIS  TECHNIQUE:  Bilateral digital diagnostic mammography and breast tomosynthesis  was performed.  COMPARISON: Previous exam(s).  ACR Breast Density Category b: There are scattered areas of  fibroglandular density.  FINDINGS:  Post lumpectomy changes on the left stable from the most recent  prior study. There is vague superficial density in the medial right  breast reflecting the current inflamed skin lesion. There are no  breast masses, areas of significant asymmetry, areas of nonsurgical  architectural distortion or suspicious calcifications.  IMPRESSION:  1. No evidence of new or recurrent breast carcinoma.  2. Benign post lumpectomy changes on the left.  RECOMMENDATION:  Screening mammogram in one year.(Code:[id number]-B-01Y)        INTERVAL HISTORY:  Larra is here today for repeat clinical assessment.  She denies any changes in her breasts.  She continues to report severe neuropathy of the bilateral lower extremities with numbness, tingling and burning.  She continues to ambulate with a walker, but is in a  wheelchair today.  States she is taking duloxetine 30 mg twice a day, as well as gabapentin 300 mg  twice a day.  She denies fevers or chills. She denies pain. Her appetite is good. Her weight has increased 7 pounds over last 4 months .  She has poorly controlled diabetes.  She states she is awaiting an appointment with him.  She states her glucose was 104 this morning.  She states she continues insulin and Januvia as previously prescribed.  She continues to follow with Dr. Lequita Halt and sees him after her mammogram in November.  She plans to have her Port-A-Cath removed at that time if her mammogram is negative.  REVIEW OF  SYSTEMS:  Review of Systems  Constitutional:  Positive for fatigue. Negative for appetite change, chills, fever and unexpected weight change.  HENT:   Negative for lump/mass, mouth sores and sore throat.   Respiratory:  Negative for cough and shortness of breath.   Cardiovascular:  Negative for chest pain and leg swelling.  Gastrointestinal:  Negative for abdominal pain, constipation, diarrhea, nausea and vomiting.  Endocrine: Negative for hot flashes.  Genitourinary:  Negative for difficulty urinating, dysuria, frequency and hematuria.   Musculoskeletal:  Positive for gait problem (due to neuropathy-uses walker). Negative for arthralgias, back pain and myalgias.  Skin:  Negative for rash.  Neurological:  Positive for extremity weakness (bilateral lower extremities), gait problem (due to neuropathy-uses walker) and numbness (bilateral feet and legs with tingling and burning). Negative for dizziness and headaches.  Hematological:  Negative for adenopathy. Does not bruise/bleed easily.  Psychiatric/Behavioral:  Positive for depression. Negative for sleep disturbance. The patient is nervous/anxious.      VITALS:  Blood pressure 107/67, pulse 98, temperature 98.4 F (36.9 C), temperature source Oral, resp. rate 18, height 5\' 9"  (1.753 m), weight 197 lb 1.6 oz (89.4 kg), SpO2 99%.   Wt Readings from Last 3 Encounters:  10/17/22 197 lb 1.6 oz (89.4 kg)  06/07/22 189 lb 11.2 oz (86 kg)  02/21/22 191 lb (86.6 kg)    Body mass index is 29.11 kg/m.  Performance status (ECOG): 2 - Symptomatic, <50% confined to bed  PHYSICAL EXAM:  Physical Exam Vitals and nursing note reviewed.  Constitutional:      General: She is not in acute distress.    Appearance: Normal appearance.  HENT:     Head: Normocephalic and atraumatic.     Mouth/Throat:     Mouth: Mucous membranes are moist.     Pharynx: Oropharynx is clear. No oropharyngeal exudate or posterior oropharyngeal erythema.  Eyes:     General: No scleral icterus.    Extraocular Movements: Extraocular movements intact.     Conjunctiva/sclera: Conjunctivae normal.     Pupils: Pupils are equal, round, and reactive to light.  Cardiovascular:     Rate and Rhythm: Normal rate and regular rhythm.     Heart sounds: Normal heart sounds. No murmur heard.    No friction rub. No gallop.  Pulmonary:     Effort: Pulmonary effort is normal.     Breath sounds: Normal breath sounds. No wheezing, rhonchi or rales.  Chest:  Breasts:    Right: Normal. No inverted nipple, mass, nipple discharge or skin change.     Left: Normal. No inverted nipple, mass, nipple discharge or skin change.  Abdominal:     General: There is no distension.     Palpations: Abdomen is soft. There is no mass.     Tenderness: There is no abdominal tenderness.  Musculoskeletal:        General: Normal range of motion.     Cervical back: Normal range of motion and neck supple. No tenderness.     Right lower leg: No edema.     Left lower leg: No edema.  Lymphadenopathy:     Cervical: No cervical adenopathy.     Upper Body:     Right upper body: No supraclavicular or axillary adenopathy.     Left upper body: No supraclavicular or axillary adenopathy.  Skin:    General: Skin is warm and dry.     Coloration: Skin is not jaundiced.     Findings: No rash.  Neurological:     Mental Status: She is alert and oriented to person, place, and time.     Cranial Nerves: No cranial nerve deficit.  Psychiatric:        Mood and Affect: Mood normal.        Behavior: Behavior normal.        Thought Content: Thought content normal.    LABS:      Latest Ref Rng & Units 10/17/2022    8:38 AM 06/07/2022   10:21 AM 01/19/2022    9:55 AM  CBC  WBC 4.0 - 10.5 K/uL 8.9  10.6  9.8   Hemoglobin 12.0 - 15.0 g/dL 63.0  16.0  10.9   Hematocrit 36.0 - 46.0 % 41.1  42.1  44.7   Platelets 150 - 400 K/uL 238  256  290       Latest Ref Rng & Units 10/17/2022    8:38 AM 06/07/2022   10:21 AM 01/19/2022    9:55 AM  CMP  Glucose 70 - 99 mg/dL 323  557  322   BUN 6 - 20 mg/dL 22  18  16    Creatinine 0.44 - 1.00 mg/dL 0.25  4.27  0.62   Sodium 135 - 145 mmol/L 132  130  136   Potassium 3.5 - 5.1 mmol/L 3.6  3.6  4.1   Chloride 98 - 111 mmol/L 95  97  100   CO2 22 - 32 mmol/L 23  25  26    Calcium 8.9 - 10.3 mg/dL 9.5  9.4  9.8   Total Protein 6.5 - 8.1 g/dL 7.7  8.1  8.2   Total Bilirubin 0.3 - 1.2 mg/dL 0.7  0.4  0.5   Alkaline Phos 38 - 126 U/L 145  176  139   AST 15 - 41 U/L 21  20  22    ALT 0 - 44 U/L 29  26  25       No results found for: "CEA1", "CEA" / No results found for: "CEA1", "CEA" No results found for: "PSA1" No results found for: "BJS283" No results found for: "CAN125"  Lab Results  Component Value Date   ALBUMINELP 4.7 09/30/2021   A1GS 0.4 (H) 09/30/2021   A2GS 0.7 09/30/2021   BETS 0.5 09/30/2021   BETA2SER 0.4 09/30/2021   GAMS 1.5 09/30/2021   SPEI  09/30/2021     Comment:     Normal Serum Protein Electrophoresis Pattern. No abnormal protein bands (M-protein) detected.    No results found for: "TIBC", "FERRITIN", "IRONPCTSAT" No results found for: "LDH"  STUDIES:  No results found.    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   Hypertension    Neuromuscular disorder (HCC)    neuropathy bilateral feet and lower legs   Port-A-Cath in place    right upper chest   Walker as ambulation aid     Past Surgical History:  Procedure Laterality Date   BIOPSY THYROID  04/2019   2.9 cm mass, benign   BREAST LUMPECTOMY Left 12/25/2019   HYSTERECTOMY ABDOMINAL WITH SALPINGO-OOPHORECTOMY Bilateral 2004   due to menorrhagia   MYOMECTOMY  1995   x6 for fibroids   TOOTH EXTRACTION N/A 05/14/2021   Procedure: DENTAL RESTORATION/EXTRACTIONS;  Surgeon:  Ocie Doyne, DMD;  Location: [facility] OR;  Service: Oral Surgery;  Laterality: N/A;    Family History  Problem Relation Age of Onset   Cancer Mother    Alcohol abuse Father    Diabetes Brother    Cancer Maternal Aunt     Social History:  reports that she has been smoking cigarettes. She has a 32 pack-year smoking history. She has never used smokeless tobacco. She reports that she does not drink alcohol and does not use drugs.The patient is alone today.  Allergies:  Allergies  Allergen Reactions   Metformin And Related Other (See Comments)    Twitching, nausea    Current Medications: Current Outpatient Medications  Medication Sig Dispense Refill   Blood Glucose Monitoring Suppl (ACCU-CHEK GUIDE) w/Device KIT Use 1 unit as directed three times a day as directed for monitoring blood sugar     amLODipine (NORVASC) 5 MG tablet Take 1 tablet (5 mg total) by mouth daily. 30 tablet 0   Blood Glucose Monitoring Suppl (TRUE METRIX METER) DEVI dx 11.9 (Medicaid preferred) USE FOR TESTING BLOOD SUGARS when Fasting daily     DULoxetine (CYMBALTA) 30 MG capsule TAKE 1 CAPSULE BY MOUTH EVERY DAY AT BEDTIME FOR 7 DAYS. THEN 2 CAPSULES AT BEDTIME 187 capsule 0   gabapentin (NEURONTIN) 300 MG capsule Take 1 capsule (300 mg total) by mouth 3 (three) times daily. (Patient taking differently: Take 300 mg by  mouth 3 (three) times daily.) 90 capsule 1   GNP ULTICARE PEN NEEDLES 32G X 4 MM MISC      hydrochlorothiazide (HYDRODIURIL) 25 MG tablet TAKE 1 TABLET (25 MG TOTAL) BY MOUTH DAILY. 90 tablet 1   insulin glargine (LANTUS) 100 UNIT/ML Solostar Pen Inject 10-40 Units into the skin See admin instructions. Take 10 units in the morning and 40 units at bedtime     JANUVIA 100 MG tablet Take 100 mg by mouth daily.     KLOR-CON M20 20 MEQ tablet TAKE 1 TABLET BY MOUTH TWICE A DAY 180 tablet 1   losartan (COZAAR) 50 MG tablet TAKE 1 TABLET BY MOUTH EVERY DAY 30 tablet 2   ondansetron (ZOFRAN) 4 MG tablet Take 4 mg by mouth every 8 (eight) hours as needed for nausea or vomiting.     oxyCODONE (OXY IR/ROXICODONE) 5 MG immediate release tablet Take 1 tablet (5 mg total) by mouth every 6 (six) hours as needed for severe pain. 120 tablet 0   TRUE METRIX BLOOD GLUCOSE TEST test strip SMARTSIG:1 Via Meter Daily PRN     TRUEplus Lancets 28G MISC SMARTSIG:1 Topical Daily PRN     Current Facility-Administered Medications  Medication Dose Route Frequency Provider Last Rate Last Admin   sodium chloride flush (NS) 0.9 % injection 10 mL  10 mL Intracatheter PRN Ilda Basset A, NP   10 mL at 10/17/22 [phone]

## 2022-10-17 NOTE — Assessment & Plan Note (Signed)
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  She was treated with lumpectomy she completed chemotherapy at the end of April 2022, followed by radiation therapy completed in June.  She completed maintenance tastuzumab/pertuzumab for a total of 1 year of HER2 targeted therapy in December 2022.    Bilateral diagnostic mammogram in November 2023 did not reveal any evidence of malignancy.  We flushed her port today.  She hopes to have this removed after her mammogram in November.  We will plan to see her back in 6 months for repeat examination.

## 2022-10-17 NOTE — Assessment & Plan Note (Addendum)
Poorly controlled diabetes.  She states she continues insulin and Januvia as prescribed.  Her glucose is 550 today, so we will refer her to her local ER.  She will need to be seen by her PCP for continued management.

## 2022-10-17 NOTE — Telephone Encounter (Signed)
-----   Message from Adah Perl sent at 10/17/2022 12:32 PM EDT ----- Please let her know glucose was 550. She needs to go to ER. Please fax labs to PCP with the note, that we directed her to the ER. Thanks

## 2022-10-17 NOTE — Telephone Encounter (Signed)
Spoke with Dr Jorge Ny office patient doesn't need referral can just call for appt since seen already for neuropathy.Patients voice mail is full at this time.

## 2022-10-17 NOTE — Assessment & Plan Note (Signed)
Persistent neuropathy without much improvement on Cymbalta 30 mg twice daily.  Dr. Loleta Chance recommended tapering off the gabapentin, so I will have her decrease her gabapentin 300 mg to once a day for a week then stop.  We will schedule her to see Dr. Loleta Chance again for further recommendations.  Unfortunately, her poorly controlled diabetes is likely contributing to the neuropathy.

## 2022-10-17 NOTE — Progress Notes (Signed)
CRITICAL VALUE STICKER  CRITICAL VALUE:  Glucose 550  RECEIVER (on-site recipient of call):  Dyane Dustman RN   DATE & TIME NOTIFIED:   10/17/2022 @ 1226  MESSENGER (representative from lab):  Or[facility] Feil lab  MD NOTIFIED:   Belva Crome PA  TIME OF NOTIFICATION:  1239  RESPONSE:  Aware.

## 2022-10-17 NOTE — Telephone Encounter (Signed)
-----   Message from Adah Perl sent at 10/17/2022  1:28 PM EDT ----- Please schedule her to see Dr. Jacquelyne Balint again for neuropathy.  Last seen 10/2021. Thanks

## 2022-10-27 ENCOUNTER — Other Ambulatory Visit: Payer: Self-pay

## 2022-10-27 DIAGNOSIS — G620 Drug-induced polyneuropathy: Secondary | ICD-10-CM

## 2022-10-28 ENCOUNTER — Encounter: Payer: Self-pay | Admitting: Hematology and Oncology

## 2022-10-28 MED ORDER — OXYCODONE HCL 5 MG PO TABS: 5.0000 mg | ORAL_TABLET | Freq: Four times a day (QID) | ORAL | 0 refills | Status: DC | PRN

## 2022-11-21 ENCOUNTER — Other Ambulatory Visit: Payer: Self-pay

## 2022-11-21 DIAGNOSIS — G620 Drug-induced polyneuropathy: Secondary | ICD-10-CM

## 2022-11-24 ENCOUNTER — Other Ambulatory Visit: Payer: Self-pay

## 2022-11-24 DIAGNOSIS — G620 Drug-induced polyneuropathy: Secondary | ICD-10-CM

## 2022-11-24 MED ORDER — OXYCODONE HCL 5 MG PO TABS: 5.0000 mg | ORAL_TABLET | Freq: Four times a day (QID) | ORAL | 0 refills | Status: DC | PRN

## 2022-12-26 ENCOUNTER — Other Ambulatory Visit: Payer: Self-pay

## 2022-12-26 ENCOUNTER — Other Ambulatory Visit: Payer: Self-pay | Admitting: Hematology and Oncology

## 2022-12-26 DIAGNOSIS — G620 Drug-induced polyneuropathy: Secondary | ICD-10-CM

## 2022-12-26 MED ORDER — OXYCODONE HCL 5 MG PO TABS: 5.0000 mg | ORAL_TABLET | Freq: Four times a day (QID) | ORAL | 0 refills | Status: DC | PRN

## 2022-12-26 MED ORDER — GABAPENTIN 300 MG PO CAPS: 300.0000 mg | ORAL_CAPSULE | Freq: Three times a day (TID) | ORAL | 1 refills | Status: DC

## 2022-12-28 ENCOUNTER — Telehealth: Payer: Self-pay

## 2022-12-28 NOTE — Telephone Encounter (Signed)
-----   Message from Adah Perl sent at 12/26/2022  5:21 PM EST ----- Regarding: RE: Refill Rx sent. Thanks ----- Message ----- From: Dyane Dustman, RN Sent: 12/26/2022   4:17 PM EST To: Adah Perl, PA-C Subject: Refill                                         Called requesting a refill on her Oxycodone 5mg .  Thanks.

## 2023-01-18 ENCOUNTER — Other Ambulatory Visit: Payer: Self-pay

## 2023-01-18 DIAGNOSIS — G620 Drug-induced polyneuropathy: Secondary | ICD-10-CM

## 2023-01-23 ENCOUNTER — Other Ambulatory Visit: Payer: Self-pay

## 2023-01-23 DIAGNOSIS — T451X5A Adverse effect of antineoplastic and immunosuppressive drugs, initial encounter: Secondary | ICD-10-CM

## 2023-01-23 MED ORDER — OXYCODONE HCL 5 MG PO TABS: 5.0000 mg | ORAL_TABLET | Freq: Four times a day (QID) | ORAL | 0 refills | Status: DC | PRN

## 2023-02-22 ENCOUNTER — Other Ambulatory Visit: Payer: Self-pay

## 2023-02-22 DIAGNOSIS — G620 Drug-induced polyneuropathy: Secondary | ICD-10-CM

## 2023-02-22 MED ORDER — OXYCODONE HCL 5 MG PO TABS: 5.0000 mg | ORAL_TABLET | Freq: Four times a day (QID) | ORAL | 0 refills | Status: DC | PRN

## 2023-03-17 ENCOUNTER — Other Ambulatory Visit: Payer: Self-pay | Admitting: Hematology and Oncology

## 2023-03-17 DIAGNOSIS — G620 Drug-induced polyneuropathy: Secondary | ICD-10-CM

## 2023-03-20 ENCOUNTER — Other Ambulatory Visit: Payer: Self-pay

## 2023-03-20 DIAGNOSIS — G620 Drug-induced polyneuropathy: Secondary | ICD-10-CM

## 2023-03-21 ENCOUNTER — Encounter: Payer: Self-pay | Admitting: Hematology and Oncology

## 2023-03-21 MED ORDER — OXYCODONE HCL 5 MG PO TABS: 5.0000 mg | ORAL_TABLET | Freq: Four times a day (QID) | ORAL | 0 refills | Status: DC | PRN

## 2023-04-14 NOTE — Progress Notes (Incomplete)
 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  She was treated with lumpectomy she completed chemotherapy at the end of April 2022, followed by radiation therapy completed in June.  She completed maintenance tastuzumab/pertuzumab for a total of 1 year of HER2 targeted therapy in December 2022.     Bilateral diagnostic mammogram in November 2023 did not reveal any evidence of malignancy.  We flushed her port today.  She hopes to have this removed after her mammogram in November.  We will plan to see her back in 6 months for repeat examination.   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  Dr. Loleta Chance recommended tapering off the gabapentin, so I will have her decrease her gabapentin 300 mg to once a day for a week then stop.  We will schedule her to see Dr. Loleta Chance again for further recommendations.  Unfortunately, her poorly controlled diabetes is likely contributing to the neuropathy.   Diabetes mellitus with diabetic neuropathy (HCC) Poorly controlled diabetes.  She states she continues insulin and Januvia as prescribed.  Her glucose is 550 today, so we will refer her to her local ER.  She will need to be seen by her PCP for continued management.   Plan:  The patient understands the plans discussed today and is in agreement with them.  She knows to contact our office if she develops concerns prior to her next appointment.   I provided 30 minutes of face-to-face time during this encounter and > 50% was spent counseling as documented under my  assessment and plan.   Gery Pray MD [facility] - [facility] DEPT OF [facility] [address] [place] [address] Dept: [phone] Dept Fax: [phone]   No orders of the defined types were placed in this encounter.   CHIEF COMPLAINT:  CC: Stage IB HER2 receptor positive left breast cancer  Current Treatment: Surveillance  HISTORY OF PRESENT ILLNESS:  Brittney Burgess is a 63 y.o. female with stage IB (T1 cN0 M0) left breast cancer diagnosed in September 2021.  This was found on screening mammogram in August 2021, which revealed a mass with calcifications, a second asymmetry and possible dilated ducts within the left breast.  Right breast was negative.  Diagnostic left mammogram and left breast ultrasound revealed a 1.6 cm elongated mass containing pleomorphic calcifications at 7 o'clock, a 5 mm hypoechoic indeterminate mass containing 2 benign-appearing calcification at 2 o'clock, and several retroareolar ecstatic ducts containing internal debris or possible tissue.  Ultrasound guided biopsy in September revealed invasive ductal carcinoma, grade 3, and high grade ductal carcinoma in situ with necrosis at 5 o'clock.  Estrogen and progesterone receptors were negative and HER2 positive.  Ki-67 was 50% biopsy at 2 o'clock was consistent with fibrocystic changes including sclerosing adenosis with calcifications.  No atypia, in situ or invasive malignancy. Biopsy at 11:30 o'clock revealed a fragmented intraductal papilloma and dilated duct.  No atypia, in situ or invasive malignancy identified.   She was treated with left lumpectomy and sentinel lymph node biopsy in November. Surgical  pathology revealed, a 1.2 cm, grade 3, invasive with ductal carcinoma in situ ductal.  Margins not involved.  One sentinel lymph node was negative for malignancy. Status post emergency hysterectomy and bilateral salpingo oophorectomy in 2004 due to menorrhagia.   She was unsure if she was given hormone replacement therapy.  She had pre-existing neuropathy of her feet from diabetes.  She was treated with adjuvant TCHP (docetaxel/carboplatin/trastuzumab/pertuzumab) completed in April 2022.  She received 5 out of 6 doses of docetaxel due to severely worsening neuropathy.  She received adjuvant radiation therapy completed in June 2022.  She received maintenance trastuzumab/pertuzumab for a total of 1 year of HER2 targeted therapy completed in December 2022.  Bilateral diagnostic mammogram in September 2022 did not reveal any evidence of malignancy.  She continued to severe neuropathy with numbness and paresthesias, for which she was given gabapentin and oxycodone 5 mg every 6 hours as needed.  The gabapentin was titrated up without improvement.  She was referred to neurology for the persistent neuropathy and saw Dr. Jacquelyne Balint and saw him in October 2023.  He recommended an EMG.  He placed her on duloxetine 30 mg daily 1 week, then titrated up to 60 mg daily.  He recommended tapering her off the gabapentin.  He also recommended an MRI head as she reported left sided weakness, but she did not go for that, nor did she follow-up with him.  We have continued the gabapentin, but she has not stayed on the recommended doses.  We have also continued the oxycodone.  Bilateral diagnostic mammogram in November 2023 did not reveal any evidence of malignancy.  Oncology History  Malignant neoplasm of lower-outer quadrant of left female breast (HCC)  09/09/2019 Mammogram   SCREENING BILATERAL MAMMOGRAM: Further evaluation is suggested for possible mass with calcifications and possible dilated ducts in the left breast.   10/01/2019 Mammogram   DIAGNOSTIC UNILATERAL LEFT MAMMOGRAM AND LEFT BREAST ULTRASOUND: A 1.6 cm elongated mass containing pleomorphic calcifications in the 7 o'clock position of the left breast, highly suspicious for breast malignancy.  Tissue sampling is  recommended.  A 5 mm hypoechoic, indeterminate mass containing 2 benign-appearing calcificaitons in the 2 o'clock position of the left breast.  Several retroareolar ecstatic ducts containing internal debris or possibly tissue.   10/21/2019 Pathology Results   1.  Breast, left, needle core biopsy, 5 o'clock, 5 cmfn:  -  Invasive ductal carcinoma, grade 3, and high grade ductal carcinoma in situ with necrosis.  -  Tumor involves multiple cores and measures 5 mm in maximum extent in a single core 2.  Breast, left, needle core biopsy, 2 o'clock:  -  Fibrocystic changes, including sclerosing adenosis with calcifications  -  No atypia, in situ or invasive malignancy identified 3.  Breast, left, needle core biopsy, 11:30 o'clock:  -  Fragmented intraductal papilloma and dilated duct  -  No atypia, in situ or invasive malignancy identified  HER2: Positive  Ration of HER2/CEN17 signals: 2.23  Average HER2 copy #/cell: 4.23 ER: Negative 0% PR: Negative 0% Ki67: 50%   11/13/2019 Breast MRI   BILATERAL BREAST MRI WITH AND WITHOUT CONTRAST: 1.  Question abnormal lymph node in the left axilla.   2, 1.5 cm enhancement in the left breast approximately 6 o'clock position with associated biopsy clip consistent with recent biopsy cancer.  3. 0.6 cm masslike enhancement in the retroareolar left breast with associated biopsy clip artifact correlating to recently biopsy proven papilloma.   12/08/2019 Initial Diagnosis  Breast cancer (HCC)   12/23/2019 Pathology Results   1.  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  Lymph node, sentinel, biopsy, left:             -  One benign, reactive lymph node with focal biopsy site reaction  (0/1)  Tumor size: 1.2 x 1.0 x 0.8 cm Histologic grade: 3   01/01/2020 Cancer Staging   Staging form: Breast, AJCC 8th Edition - Clinical stage from 01/01/2020: Stage IA (cT1c, cN0(sn), cM0, G3, ER-, PR-, HER2+) - Signed by Dellia Beckwith, MD on 01/09/2020   01/29/2020 - 01/07/2021 Chemotherapy   Patient is on Treatment Plan : BREAST  Docetaxel + Carboplatin + Trastuzumab + Pertuzumab  (TCHP) q21d (COMPLETED TCHP ON 05/20/2020) / Trastuzumab + Pertuzumab q21d      06/15/2020 - 07/21/2020 Radiation Therapy   5000 cGy in 25 fractions    10/14/2020 Mammogram   DIAGNOSTIC BILATERAL MAMMOGRAM: No evidence of malignancy in either breast.    10/29/2020 Echocardiogram   Overall left ventricular systolic function is normal with, an EF between 55 - 60 %.    12/01/2021 Imaging   Bilateral diagnostic mammogram    EXAM:  DIGITAL DIAGNOSTIC BILATERAL MAMMOGRAM WITH TOMOSYNTHESIS  TECHNIQUE:  Bilateral digital diagnostic mammography and breast tomosynthesis  was performed.  COMPARISON: Previous exam(s).  ACR Breast Density Category b: There are scattered areas of  fibroglandular density.  FINDINGS:  Post lumpectomy changes on the left stable from the most recent  prior study. There is vague superficial density in the medial right  breast reflecting the current inflamed skin lesion. There are no  breast masses, areas of significant asymmetry, areas of nonsurgical  architectural distortion or suspicious calcifications.  IMPRESSION:  1. No evidence of new or recurrent breast carcinoma.  2. Benign post lumpectomy changes on the left.  RECOMMENDATION:  Screening mammogram in one year.(Code:[id number]-01Y)      INTERVAL HISTORY:  Brittney Burgess is here today for repeat clinical assessment for her stage IB HER2 receptor positive left breast cancer. Patient states that she feels *** and ***.     She denies signs of infection such as sore throat, sinus drainage, cough, or urinary symptoms.  She denies  fevers or recurrent chills. She denies pain. She denies nausea, vomiting, chest pain, dyspnea or cough. Her appetite is *** and her weight {Weight change:10426}.    She denies any changes in her breasts.  She continues to report severe neuropathy of the bilateral lower extremities with numbness, tingling and burning.  She continues to ambulate with a walker, but is in a wheelchair today.  States she is taking duloxetine 30 mg twice a day, as well as gabapentin 300 mg  twice a day.  She has poorly controlled diabetes.  She states she is awaiting an appointment with him.  She states her glucose was 104 this morning.  She states she continues insulin and Januvia  as previously prescribed.  She continues to follow with Dr. Lequita Halt and sees him after her mammogram in November.  She plans to have her Port-A-Cath removed at that time if her mammogram is negative.  REVIEW OF SYSTEMS:  Review of Systems  Constitutional:  Positive for fatigue. Negative for appetite change, chills, fever and unexpected weight change.  HENT:  Negative.  Negative for lump/mass, mouth sores and sore throat.   Eyes: Negative.   Respiratory: Negative.  Negative for chest tightness, cough, hemoptysis, shortness of breath and wheezing.   Cardiovascular: Negative.  Negative for chest pain, leg swelling and palpitations.  Gastrointestinal: Negative.  Negative for abdominal distention, abdominal pain, blood in stool, constipation, diarrhea, nausea and vomiting.  Endocrine: Negative.  Negative for hot flashes.  Genitourinary: Negative.  Negative for difficulty urinating, dysuria, frequency and hematuria.   Musculoskeletal:  Positive for gait problem (due to neuropathy-uses walker). Negative for arthralgias, back pain, flank pain and myalgias.  Skin: Negative.  Negative for rash.  Neurological:  Positive for extremity weakness (bilateral lower extremities), gait problem (due to neuropathy-uses walker) and numbness (bilateral feet and legs  with tingling and burning). Negative for dizziness, headaches, light-headedness, seizures and speech difficulty.  Hematological: Negative.  Negative for adenopathy. Does not bruise/bleed easily.  Psychiatric/Behavioral:  Positive for depression. Negative for sleep disturbance. The patient is nervous/anxious.      VITALS:  There were no vitals taken for this visit.  Wt Readings from Last 3 Encounters:  10/17/22 197 lb 1.6 oz (89.4 kg)  06/07/22 189 lb 11.2 oz (86 kg)  02/21/22 191 lb (86.6 kg)    There is no height or weight on file to calculate BMI.  Performance status (ECOG): 2 - Symptomatic, <50% confined to bed  PHYSICAL EXAM:  Physical Exam Vitals and nursing note reviewed.  Constitutional:      General: She is not in acute distress.    Appearance: Normal appearance.  HENT:     Head: Normocephalic and atraumatic.     Mouth/Throat:     Mouth: Mucous membranes are moist.     Pharynx: Oropharynx is clear. No oropharyngeal exudate or posterior oropharyngeal erythema.  Eyes:     General: No scleral icterus.    Extraocular Movements: Extraocular movements intact.     Conjunctiva/sclera: Conjunctivae normal.     Pupils: Pupils are equal, round, and reactive to light.  Cardiovascular:     Rate and Rhythm: Normal rate and regular rhythm.     Heart sounds: Normal heart sounds. No murmur heard.    No friction rub. No gallop.  Pulmonary:     Effort: Pulmonary effort is normal.     Breath sounds: Normal breath sounds. No wheezing, rhonchi or rales.  Chest:  Breasts:    Right: Normal. No inverted nipple, mass, nipple discharge or skin change.     Left: Normal. No inverted nipple, mass, nipple discharge or skin change.  Abdominal:     General: There is no distension.     Palpations: Abdomen is soft. There is no mass.     Tenderness: There is no abdominal tenderness.  Musculoskeletal:        General: Normal range of motion.     Cervical back: Normal range of motion and neck  supple. No tenderness.     Right lower leg: No edema.     Left lower leg: No edema.  Lymphadenopathy:     Cervical: No cervical adenopathy.     Upper Body:  Right upper body: No supraclavicular or axillary adenopathy.     Left upper body: No supraclavicular or axillary adenopathy.  Skin:    General: Skin is warm and dry.     Coloration: Skin is not jaundiced.     Findings: No rash.  Neurological:     Mental Status: She is alert and oriented to person, place, and time.     Cranial Nerves: No cranial nerve deficit.  Psychiatric:        Mood and Affect: Mood normal.        Behavior: Behavior normal.        Thought Content: Thought content normal.     LABS:      Latest Ref Rng & Units 10/17/2022    8:38 AM 06/07/2022   10:21 AM 01/19/2022    9:55 AM  CBC  WBC 4.0 - 10.5 K/uL 8.9  10.6  9.8   Hemoglobin 12.0 - 15.0 g/dL 19.1  47.8  29.5   Hematocrit 36.0 - 46.0 % 41.1  42.1  44.7   Platelets 150 - 400 K/uL 238  256  290       Latest Ref Rng & Units 10/17/2022    8:38 AM 06/07/2022   10:21 AM 01/19/2022    9:55 AM  CMP  Glucose 70 - 99 mg/dL 621  308  657   BUN 6 - 20 mg/dL 22  18  16    Creatinine 0.44 - 1.00 mg/dL 8.46  9.62  9.52   Sodium 135 - 145 mmol/L 132  130  136   Potassium 3.5 - 5.1 mmol/L 3.6  3.6  4.1   Chloride 98 - 111 mmol/L 95  97  100   CO2 22 - 32 mmol/L 23  25  26    Calcium 8.9 - 10.3 mg/dL 9.5  9.4  9.8   Total Protein 6.5 - 8.1 g/dL 7.7  8.1  8.2   Total Bilirubin 0.3 - 1.2 mg/dL 0.7  0.4  0.5   Alkaline Phos 38 - 126 U/L 145  176  139   AST 15 - 41 U/L 21  20  22    ALT 0 - 44 U/L 29  26  25       No results found for: "CEA1", "CEA" / No results found for: "CEA1", "CEA" No results found for: "PSA1" No results found for: "WUX324" No results found for: "CAN125"  Lab Results  Component Value Date   ALBUMINELP 4.7 09/30/2021   A1GS 0.4 (H) 09/30/2021   A2GS 0.7 09/30/2021   BETS 0.5 09/30/2021   BETA2SER 0.4 09/30/2021   GAMS 1.5 09/30/2021    SPEI  09/30/2021     Comment:     Normal Serum Protein Electrophoresis Pattern. No abnormal protein bands (M-protein) detected.    No results found for: "TIBC", "FERRITIN", "IRONPCTSAT" No results found for: "LDH"  STUDIES:  No results found.    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   Hypertension    Neuromuscular disorder (HCC)    neuropathy bilateral feet and lower legs   Port-A-Cath in place    right upper chest   Walker as ambulation aid     Past Surgical History:  Procedure Laterality Date   BIOPSY THYROID  04/2019   2.9 cm mass, benign   BREAST LUMPECTOMY Left 12/25/2019   HYSTERECTOMY ABDOMINAL WITH SALPINGO-OOPHORECTOMY Bilateral 2004   due to menorrhagia   MYOMECTOMY  1995   x6 for fibroids   TOOTH EXTRACTION N/A 05/14/2021   Procedure: DENTAL RESTORATION/EXTRACTIONS;  Surgeon: Ocie Doyne, DMD;  Location: [facility] OR;  Service: Oral Surgery;  Laterality: N/A;    Family History  Problem Relation Age of Onset   Cancer Mother    Alcohol abuse Father    Diabetes Brother    Cancer Maternal Aunt     Social History:  reports that she has been smoking cigarettes. She has a 32 pack-year smoking history. She has never used smokeless tobacco. She reports that she does not drink alcohol and does not use drugs.The patient is alone today.  Allergies:  Allergies  Allergen Reactions   Metformin And Related Other (See Comments)    Twitching, nausea    Current Medications: Current Outpatient Medications  Medication Sig Dispense Refill   amLODipine (NORVASC) 5 MG tablet Take 1 tablet (5 mg total) by mouth daily. 30 tablet 0   Blood Glucose Monitoring Suppl (ACCU-CHEK GUIDE) w/Device KIT Use 1 unit as directed three times a day as directed for monitoring blood sugar     Blood Glucose  Monitoring Suppl (TRUE METRIX METER) DEVI dx 11.9 (Medicaid preferred) USE FOR TESTING BLOOD SUGARS when Fasting daily     DULoxetine (CYMBALTA) 30 MG capsule TAKE 1 CAPSULE BY MOUTH EVERY DAY AT BEDTIME FOR 7 DAYS. THEN 2 CAPSULES AT BEDTIME 187 capsule 0   gabapentin (NEURONTIN) 300 MG capsule Take 1 capsule (300 mg total) by mouth 3 (three) times daily. 90 capsule 1   GNP ULTICARE PEN NEEDLES 32G X 4 MM MISC      hydrochlorothiazide (HYDRODIURIL) 25 MG tablet TAKE 1 TABLET (25 MG TOTAL) BY MOUTH DAILY. 90 tablet 1   insulin glargine (LANTUS) 100 UNIT/ML Solostar Pen Inject 10-40 Units into the skin See admin instructions. Take 10 units in the morning and 40 units at bedtime     JANUVIA 100 MG tablet Take 100 mg by mouth daily.     KLOR-CON M20 20 MEQ tablet TAKE 1 TABLET BY MOUTH TWICE A DAY 180 tablet 1   losartan (COZAAR) 50 MG tablet TAKE 1 TABLET BY MOUTH EVERY DAY 30 tablet 2   ondansetron (ZOFRAN) 4 MG tablet Take 4 mg by mouth every 8 (eight) hours as needed for nausea or vomiting.     oxyCODONE (OXY IR/ROXICODONE) 5 MG immediate release tablet Take 1 tablet (5 mg total) by mouth every 6 (six) hours as needed for severe pain (pain score 7-10). 120 tablet 0   TRUE METRIX BLOOD GLUCOSE TEST test strip SMARTSIG:1 Via Meter Daily PRN     TRUEplus Lancets 28G MISC SMARTSIG:1 Topical Daily PRN     No current facility-administered medications for this visit.    I,Jasmine M Lassiter,acting as a scribe for Dellia Beckwith, MD.,have documented all relevant documentation on the behalf of Dellia Beckwith, MD,as directed by  Dellia Beckwith, MD while in the presence of Dellia Beckwith, MD.

## 2023-04-17 ENCOUNTER — Other Ambulatory Visit: Payer: Self-pay | Admitting: Oncology

## 2023-04-17 DIAGNOSIS — C50512 Malignant neoplasm of lower-outer quadrant of left female breast: Secondary | ICD-10-CM

## 2023-04-18 ENCOUNTER — Inpatient Hospital Stay: Payer: 59

## 2023-04-18 ENCOUNTER — Inpatient Hospital Stay: Payer: 59 | Admitting: Oncology

## 2023-04-18 DIAGNOSIS — C50512 Malignant neoplasm of lower-outer quadrant of left female breast: Secondary | ICD-10-CM

## 2023-04-19 ENCOUNTER — Other Ambulatory Visit: Payer: Self-pay

## 2023-04-19 DIAGNOSIS — T451X5A Adverse effect of antineoplastic and immunosuppressive drugs, initial encounter: Secondary | ICD-10-CM

## 2023-04-19 MED ORDER — GABAPENTIN 300 MG PO CAPS
300.0000 mg | ORAL_CAPSULE | Freq: Three times a day (TID) | ORAL | 1 refills | Status: AC
Start: 2023-04-19 — End: ?

## 2023-04-19 MED ORDER — OXYCODONE HCL 5 MG PO TABS
5.0000 mg | ORAL_TABLET | Freq: Four times a day (QID) | ORAL | 0 refills | Status: DC | PRN
Start: 2023-04-19 — End: 2023-05-17

## 2023-05-17 ENCOUNTER — Other Ambulatory Visit: Payer: Self-pay

## 2023-05-17 DIAGNOSIS — G620 Drug-induced polyneuropathy: Secondary | ICD-10-CM

## 2023-05-17 MED ORDER — OXYCODONE HCL 5 MG PO TABS
5.0000 mg | ORAL_TABLET | Freq: Four times a day (QID) | ORAL | 0 refills | Status: DC | PRN
Start: 2023-05-17 — End: 2023-06-13

## 2023-05-18 ENCOUNTER — Encounter: Payer: Self-pay | Admitting: Hematology and Oncology

## 2023-05-18 ENCOUNTER — Other Ambulatory Visit: Payer: Self-pay | Admitting: Oncology

## 2023-05-18 DIAGNOSIS — E876 Hypokalemia: Secondary | ICD-10-CM

## 2023-05-30 ENCOUNTER — Telehealth: Payer: Self-pay

## 2023-05-30 NOTE — Telephone Encounter (Signed)
 Explained to patient that the pharmacy will not fill them this early.  She acknowledged this and said she would keep looking.

## 2023-06-07 ENCOUNTER — Inpatient Hospital Stay

## 2023-06-07 ENCOUNTER — Inpatient Hospital Stay: Admitting: Oncology
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
## 2023-06-12 ENCOUNTER — Telehealth: Payer: Self-pay

## 2023-06-12 NOTE — Telephone Encounter (Signed)
 Pt states, "I need something stronger for pain. I am having to take more of the oxycodone . You think maybe they would go up to 10mg "?

## 2023-06-13 ENCOUNTER — Other Ambulatory Visit: Payer: Self-pay | Admitting: Oncology

## 2023-06-13 ENCOUNTER — Other Ambulatory Visit: Payer: Self-pay

## 2023-06-13 DIAGNOSIS — G620 Drug-induced polyneuropathy: Secondary | ICD-10-CM

## 2023-06-13 MED ORDER — OXYCODONE HCL 10 MG PO TABS: 10.0000 mg | ORAL_TABLET | ORAL | 0 refills | Status: DC | PRN

## 2023-06-15 ENCOUNTER — Encounter: Payer: Self-pay | Admitting: Oncology

## 2023-06-15 ENCOUNTER — Ambulatory Visit: Admitting: Oncology

## 2023-06-15 ENCOUNTER — Inpatient Hospital Stay (HOSPITAL_BASED_OUTPATIENT_CLINIC_OR_DEPARTMENT_OTHER): Admitting: Oncology

## 2023-06-15 ENCOUNTER — Inpatient Hospital Stay: Attending: Oncology

## 2023-06-15 ENCOUNTER — Inpatient Hospital Stay

## 2023-06-15 ENCOUNTER — Other Ambulatory Visit

## 2023-06-15 VITALS — BP 123/85 | HR 107 | Temp 98.1°F | Resp 18 | Ht 69.0 in | Wt 165.1 lb

## 2023-06-15 DIAGNOSIS — Z794 Long term (current) use of insulin: Secondary | ICD-10-CM | POA: Diagnosis not present

## 2023-06-15 DIAGNOSIS — E1065 Type 1 diabetes mellitus with hyperglycemia: Secondary | ICD-10-CM | POA: Diagnosis not present

## 2023-06-15 DIAGNOSIS — G620 Drug-induced polyneuropathy: Secondary | ICD-10-CM

## 2023-06-15 DIAGNOSIS — T451X5A Adverse effect of antineoplastic and immunosuppressive drugs, initial encounter: Secondary | ICD-10-CM

## 2023-06-15 DIAGNOSIS — C50512 Malignant neoplasm of lower-outer quadrant of left female breast: Secondary | ICD-10-CM

## 2023-06-15 DIAGNOSIS — Z17420 Hormone receptor negative with human epidermal growth factor receptor 2 positive status: Secondary | ICD-10-CM | POA: Insufficient documentation

## 2023-06-15 DIAGNOSIS — Z171 Estrogen receptor negative status [ER-]: Secondary | ICD-10-CM | POA: Diagnosis not present

## 2023-06-15 LAB — CBC WITH DIFFERENTIAL (CANCER CENTER ONLY)
Abs Immature Granulocytes: 0.02 10*3/uL (ref 0.00–0.07)
Basophils Absolute: 0 10*3/uL (ref 0.0–0.1)
Basophils Relative: 0 %
Eosinophils Absolute: 0.1 10*3/uL (ref 0.0–0.5)
Eosinophils Relative: 1 %
HCT: 37.8 % (ref 36.0–46.0)
Hemoglobin: 13.5 g/dL (ref 12.0–15.0)
Immature Granulocytes: 0 %
Lymphocytes Relative: 18 %
Lymphs Abs: 1.7 10*3/uL (ref 0.7–4.0)
MCH: 28.7 pg (ref 26.0–34.0)
MCHC: 35.7 g/dL (ref 30.0–36.0)
MCV: 80.4 fL (ref 80.0–100.0)
Monocytes Absolute: 0.4 10*3/uL (ref 0.1–1.0)
Monocytes Relative: 4 %
Neutro Abs: 7 10*3/uL (ref 1.7–7.7)
Neutrophils Relative %: 77 %
Platelet Count: 269 10*3/uL (ref 150–400)
RBC: 4.7 MIL/uL (ref 3.87–5.11)
RDW: 13.2 % (ref 11.5–15.5)
WBC Count: 9.1 10*3/uL (ref 4.0–10.5)
nRBC: 0 % (ref 0.0–0.2)

## 2023-06-15 LAB — CMP (CANCER CENTER ONLY)
ALT: 16 U/L (ref 0–44)
AST: 17 U/L (ref 15–41)
Albumin: 4.1 g/dL (ref 3.5–5.0)
Alkaline Phosphatase: 202 U/L — ABNORMAL HIGH (ref 38–126)
Anion gap: 15 (ref 5–15)
BUN: 20 mg/dL (ref 6–20)
CO2: 21 mmol/L — ABNORMAL LOW (ref 22–32)
Calcium: 10.1 mg/dL (ref 8.9–10.3)
Chloride: 93 mmol/L — ABNORMAL LOW (ref 98–111)
Creatinine: 1.45 mg/dL — ABNORMAL HIGH (ref 0.44–1.00)
GFR, Estimated: 41 mL/min — ABNORMAL LOW (ref >60)
Glucose, Bld: 735 mg/dL (ref 70–99)
Potassium: 4.1 mmol/L (ref 3.5–5.1)
Sodium: 129 mmol/L — ABNORMAL LOW (ref 135–145)
Total Bilirubin: 0.3 mg/dL (ref 0.0–1.2)
Total Protein: 7.5 g/dL (ref 6.5–8.1)
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
## 2023-06-15 NOTE — Progress Notes (Signed)
 CRITICAL VALUE STICKER  CRITICAL VALUE:  Glu [id number]  RECEIVER (on-site recipient of call):  Shelbie Dess RN  DATE & TIME NOTIFIED:   06/15/2023 @ 1624  MESSENGER (representative from lab):  [facility]  MD NOTIFIED:   Dr. Almer Jacobson  TIME OF NOTIFICATION:  1630  RESPONSE:   Schedule to see patient in clinic now.

## 2023-06-16 ENCOUNTER — Other Ambulatory Visit: Payer: Self-pay

## 2023-06-16 DIAGNOSIS — Z794 Long term (current) use of insulin: Secondary | ICD-10-CM

## 2023-06-20 ENCOUNTER — Inpatient Hospital Stay: Admitting: Dietician

## 2023-06-20 NOTE — Progress Notes (Signed)
 Nutrition Assessment   Reason for Assessment: MST screen for weight loss.    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  She was treated with lumpectomy she completed chemotherapy at the end of April 2022, followed by radiation therapy completed in June.  She completed maintenance tastuzumab/pertuzumab  for a total of 1 year of HER2 targeted therapy in December 2022.  Bilateral diagnostic mammogram in November 2023 did not reveal any evidence of malignancy.  She was in for routine follow up last week and sent to ER with glucose reading 735.   Patient reports received blood glucose monitor yesterday has has supportive niece to aid with her blood sugar control.  She relayed that her weight loss was intentional after removing junk and sugary foods from diet and switching to diet sodas.  She has good appetite and no concerns with eating at this time.   Anthropometrics: weight loss 32# (16%) past 8 months which is significant for timeframe  Height: 69" Weight: 165.1# UBW: 185-195# BMI: 24.38   NUTRITION DIAGNOSIS:  Food and Nutrition Related Knowledge Deficit related to cancer and associated treatments as evidenced by no prior need for nutrition related information.   INTERVENTION:  Relayed that nutrition services are wrap around service provided at no charge and encouraged continued communication if experiencing continued weight loss or any nutritional impact symptoms (NIS). Educated on importance of adequate calorie and protein energy intake  with nutrient dense foods when possible to maintain weight/strength Encouraged frequent meals and protein pacing Encouraged weight maintenance and adherence to insulin  and meds. Encouraged having PCP make referral to diabetes educators Contact information provided   MONITORING, EVALUATION,  GOAL: weight, PO intake, Nutrition Impact Symptoms, labs Goal is weight maintenance  Next Visit: PRN at patient or provider request  Carleen Chary, RDN, LDN Registered Dietitian, [facility] Part Time Remote (Usual office hours: Tuesday-Thursday) Cell: [phone]

## 2023-06-22 ENCOUNTER — Other Ambulatory Visit: Payer: Self-pay | Admitting: Oncology

## 2023-06-22 ENCOUNTER — Telehealth: Payer: Self-pay | Admitting: Oncology

## 2023-06-22 ENCOUNTER — Encounter: Payer: Self-pay | Admitting: Hematology and Oncology

## 2023-06-22 DIAGNOSIS — C50512 Malignant neoplasm of lower-outer quadrant of left female breast: Secondary | ICD-10-CM

## 2023-06-22 DIAGNOSIS — Z1239 Encounter for other screening for malignant neoplasm of breast: Secondary | ICD-10-CM

## 2023-06-22 NOTE — Telephone Encounter (Signed)
 Patient has been scheduled. Aware of appt date and time.     Scheduling Message Entered by Nolia Baumgartner on 06/22/2023 at  7:19 AM Priority: Routine EST PT 30  Department: [facility]  Provider:  Scheduling Notes:  She needs appt in approx 3 months with labs and port flush

## 2023-06-23 ENCOUNTER — Telehealth: Payer: Self-pay

## 2023-06-23 ENCOUNTER — Inpatient Hospital Stay (HOSPITAL_BASED_OUTPATIENT_CLINIC_OR_DEPARTMENT_OTHER): Admission: RE | Admit: 2023-06-23 | Source: Ambulatory Visit | Admitting: Radiology

## 2023-06-23 NOTE — Telephone Encounter (Signed)
 Patient called and cancelled mammogram  no transportation today wanting to resch, imaging center here notified will call patient to resch.

## 2023-07-05 ENCOUNTER — Inpatient Hospital Stay (HOSPITAL_BASED_OUTPATIENT_CLINIC_OR_DEPARTMENT_OTHER): Admission: RE | Admit: 2023-07-05 | Source: Ambulatory Visit | Admitting: Radiology

## 2023-07-10 DIAGNOSIS — E1140 Type 2 diabetes mellitus with diabetic neuropathy, unspecified: Secondary | ICD-10-CM | POA: Diagnosis not present

## 2023-07-10 DIAGNOSIS — G894 Chronic pain syndrome: Secondary | ICD-10-CM | POA: Diagnosis not present

## 2023-07-10 DIAGNOSIS — I10 Essential (primary) hypertension: Secondary | ICD-10-CM | POA: Diagnosis not present

## 2023-07-10 DIAGNOSIS — E1169 Type 2 diabetes mellitus with other specified complication: Secondary | ICD-10-CM | POA: Diagnosis not present

## 2023-07-10 DIAGNOSIS — R5383 Other fatigue: Secondary | ICD-10-CM | POA: Diagnosis not present

## 2023-07-10 DIAGNOSIS — C50911 Malignant neoplasm of unspecified site of right female breast: Secondary | ICD-10-CM | POA: Diagnosis not present

## 2023-07-11 ENCOUNTER — Telehealth: Payer: Self-pay

## 2023-07-11 DIAGNOSIS — G620 Drug-induced polyneuropathy: Secondary | ICD-10-CM

## 2023-07-11 NOTE — Telephone Encounter (Signed)
 Pt called to let Dr Almer Jacobson know she saw a new PCP yesterday, will be helping with diabetes management.

## 2023-07-12 ENCOUNTER — Other Ambulatory Visit: Payer: Self-pay | Admitting: Hematology and Oncology

## 2023-07-12 ENCOUNTER — Encounter: Payer: Self-pay | Admitting: Hematology and Oncology

## 2023-07-12 DIAGNOSIS — G620 Drug-induced polyneuropathy: Secondary | ICD-10-CM

## 2023-07-12 MED ORDER — OXYCODONE HCL 10 MG PO TABS: 10.0000 mg | ORAL_TABLET | ORAL | 0 refills | Status: DC | PRN

## 2023-08-02 ENCOUNTER — Telehealth: Payer: Self-pay

## 2023-08-02 NOTE — Telephone Encounter (Signed)
 Pt's last oxycodone  dispense was 07/12/2023.

## 2023-08-07 ENCOUNTER — Inpatient Hospital Stay (HOSPITAL_BASED_OUTPATIENT_CLINIC_OR_DEPARTMENT_OTHER): Admission: RE | Admit: 2023-08-07 | Source: Ambulatory Visit | Admitting: Radiology

## 2023-08-08 ENCOUNTER — Other Ambulatory Visit: Payer: Self-pay

## 2023-08-08 DIAGNOSIS — G620 Drug-induced polyneuropathy: Secondary | ICD-10-CM

## 2023-08-08 MED ORDER — OXYCODONE HCL 10 MG PO TABS: 10.0000 mg | ORAL_TABLET | ORAL | 0 refills | Status: DC | PRN

## 2023-08-09 DIAGNOSIS — E1140 Type 2 diabetes mellitus with diabetic neuropathy, unspecified: Secondary | ICD-10-CM | POA: Diagnosis not present

## 2023-08-09 DIAGNOSIS — D72829 Elevated white blood cell count, unspecified: Secondary | ICD-10-CM | POA: Diagnosis not present

## 2023-08-09 DIAGNOSIS — C50911 Malignant neoplasm of unspecified site of right female breast: Secondary | ICD-10-CM | POA: Diagnosis not present

## 2023-08-09 DIAGNOSIS — I10 Essential (primary) hypertension: Secondary | ICD-10-CM | POA: Diagnosis not present

## 2023-08-09 DIAGNOSIS — I739 Peripheral vascular disease, unspecified: Secondary | ICD-10-CM | POA: Diagnosis not present

## 2023-08-09 DIAGNOSIS — E1169 Type 2 diabetes mellitus with other specified complication: Secondary | ICD-10-CM | POA: Diagnosis not present

## 2023-08-09 DIAGNOSIS — Z6823 Body mass index (BMI) 23.0-23.9, adult: Secondary | ICD-10-CM | POA: Diagnosis not present

## 2023-08-09 DIAGNOSIS — N1831 Chronic kidney disease, stage 3a: Secondary | ICD-10-CM | POA: Diagnosis not present

## 2023-08-09 DIAGNOSIS — G894 Chronic pain syndrome: Secondary | ICD-10-CM | POA: Diagnosis not present

## 2023-09-04 ENCOUNTER — Inpatient Hospital Stay (HOSPITAL_BASED_OUTPATIENT_CLINIC_OR_DEPARTMENT_OTHER): Admission: RE | Admit: 2023-09-04 | Source: Ambulatory Visit | Admitting: Radiology

## 2023-09-04 ENCOUNTER — Telehealth: Payer: Self-pay

## 2023-09-04 DIAGNOSIS — T451X5A Adverse effect of antineoplastic and immunosuppressive drugs, initial encounter: Secondary | ICD-10-CM

## 2023-09-04 NOTE — Telephone Encounter (Signed)
 Pt req refill of oxycodone . I know it isn't time yet, I just wanted to remind you.

## 2023-09-06 ENCOUNTER — Encounter: Payer: Self-pay | Admitting: Hematology and Oncology

## 2023-09-06 MED ORDER — OXYCODONE HCL 10 MG PO TABS: 10.0000 mg | ORAL_TABLET | ORAL | 0 refills | Status: DC | PRN

## 2023-09-08 ENCOUNTER — Ambulatory Visit: Attending: Cardiology | Admitting: Cardiology

## 2023-09-15 ENCOUNTER — Telehealth: Payer: Self-pay | Admitting: Oncology

## 2023-09-15 NOTE — Telephone Encounter (Signed)
 Appt for 09/20/23 cancelled by pt, offered to R/S the appt but per pt she needs to wait until her driver gets back from maternity leave to know when to schedule for.

## 2023-09-20 ENCOUNTER — Inpatient Hospital Stay

## 2023-09-20 ENCOUNTER — Inpatient Hospital Stay: Admitting: Oncology

## 2023-10-05 ENCOUNTER — Other Ambulatory Visit: Payer: Self-pay

## 2023-10-05 DIAGNOSIS — T451X5A Adverse effect of antineoplastic and immunosuppressive drugs, initial encounter: Secondary | ICD-10-CM

## 2023-10-05 MED ORDER — OXYCODONE HCL 10 MG PO TABS: 10.0000 mg | ORAL_TABLET | ORAL | 0 refills | Status: DC | PRN

## 2023-10-18 ENCOUNTER — Inpatient Hospital Stay (HOSPITAL_BASED_OUTPATIENT_CLINIC_OR_DEPARTMENT_OTHER): Admission: RE | Admit: 2023-10-18 | Source: Ambulatory Visit | Admitting: Radiology

## 2023-11-01 ENCOUNTER — Other Ambulatory Visit: Payer: Self-pay

## 2023-11-01 DIAGNOSIS — T451X5A Adverse effect of antineoplastic and immunosuppressive drugs, initial encounter: Secondary | ICD-10-CM

## 2023-11-01 MED ORDER — OXYCODONE HCL 10 MG PO TABS: 10.0000 mg | ORAL_TABLET | ORAL | 0 refills | Status: DC | PRN

## 2023-11-21 ENCOUNTER — Inpatient Hospital Stay: Attending: Oncology

## 2023-11-21 ENCOUNTER — Telehealth: Payer: Self-pay

## 2023-11-21 ENCOUNTER — Inpatient Hospital Stay: Admitting: Oncology

## 2023-11-21 NOTE — Telephone Encounter (Signed)
 Attempted call to pt, to let her know her appt is today @ 330p for lab, 4p for Dr Cornelius. Also it is too early to refill pain med (due 12/02/2023). No answer.

## 2023-11-30 ENCOUNTER — Other Ambulatory Visit: Payer: Self-pay

## 2023-11-30 DIAGNOSIS — G620 Drug-induced polyneuropathy: Secondary | ICD-10-CM

## 2023-11-30 MED ORDER — OXYCODONE HCL 10 MG PO TABS: 10.0000 mg | ORAL_TABLET | ORAL | 0 refills | Status: DC | PRN

## 2023-12-08 ENCOUNTER — Other Ambulatory Visit: Payer: Self-pay | Admitting: Oncology

## 2023-12-08 ENCOUNTER — Encounter: Payer: Self-pay | Admitting: Oncology

## 2023-12-08 ENCOUNTER — Telehealth: Payer: Self-pay

## 2023-12-08 ENCOUNTER — Other Ambulatory Visit: Payer: Self-pay | Admitting: Pharmacist

## 2023-12-08 ENCOUNTER — Inpatient Hospital Stay: Attending: Oncology

## 2023-12-08 ENCOUNTER — Telehealth: Payer: Self-pay | Admitting: Oncology

## 2023-12-08 ENCOUNTER — Inpatient Hospital Stay (HOSPITAL_BASED_OUTPATIENT_CLINIC_OR_DEPARTMENT_OTHER): Admitting: Oncology

## 2023-12-08 ENCOUNTER — Inpatient Hospital Stay

## 2023-12-08 VITALS — BP 185/98 | HR 94 | Temp 98.3°F | Resp 18 | Ht 69.0 in | Wt 169.6 lb

## 2023-12-08 DIAGNOSIS — Z853 Personal history of malignant neoplasm of breast: Secondary | ICD-10-CM | POA: Insufficient documentation

## 2023-12-08 DIAGNOSIS — Z171 Estrogen receptor negative status [ER-]: Secondary | ICD-10-CM

## 2023-12-08 DIAGNOSIS — Z923 Personal history of irradiation: Secondary | ICD-10-CM | POA: Insufficient documentation

## 2023-12-08 DIAGNOSIS — E876 Hypokalemia: Secondary | ICD-10-CM

## 2023-12-08 DIAGNOSIS — C50512 Malignant neoplasm of lower-outer quadrant of left female breast: Secondary | ICD-10-CM

## 2023-12-08 DIAGNOSIS — Z23 Encounter for immunization: Secondary | ICD-10-CM | POA: Insufficient documentation

## 2023-12-08 DIAGNOSIS — E1140 Type 2 diabetes mellitus with diabetic neuropathy, unspecified: Secondary | ICD-10-CM

## 2023-12-08 DIAGNOSIS — F17210 Nicotine dependence, cigarettes, uncomplicated: Secondary | ICD-10-CM | POA: Insufficient documentation

## 2023-12-08 LAB — CBC WITH DIFFERENTIAL (CANCER CENTER ONLY)
Abs Immature Granulocytes: 0.03 K/uL (ref 0.00–0.07)
Basophils Absolute: 0 K/uL (ref 0.0–0.1)
Basophils Relative: 0 %
Eosinophils Absolute: 0.1 K/uL (ref 0.0–0.5)
Eosinophils Relative: 1 %
HCT: 43 % (ref 36.0–46.0)
Hemoglobin: 14.5 g/dL (ref 12.0–15.0)
Immature Granulocytes: 0 %
Lymphocytes Relative: 17 %
Lymphs Abs: 1.6 K/uL (ref 0.7–4.0)
MCH: 27.4 pg (ref 26.0–34.0)
MCHC: 33.7 g/dL (ref 30.0–36.0)
MCV: 81.3 fL (ref 80.0–100.0)
Monocytes Absolute: 0.4 K/uL (ref 0.1–1.0)
Monocytes Relative: 4 %
Neutro Abs: 7.4 K/uL (ref 1.7–7.7)
Neutrophils Relative %: 78 %
Platelet Count: 256 K/uL (ref 150–400)
RBC: 5.29 MIL/uL — ABNORMAL HIGH (ref 3.87–5.11)
RDW: 13.4 % (ref 11.5–15.5)
WBC Count: 9.6 K/uL (ref 4.0–10.5)
nRBC: 0 % (ref 0.0–0.2)

## 2023-12-08 LAB — CMP (CANCER CENTER ONLY)
ALT: 15 U/L (ref 0–44)
AST: 18 U/L (ref 15–41)
Albumin: 4.2 g/dL (ref 3.5–5.0)
Alkaline Phosphatase: 158 U/L — ABNORMAL HIGH (ref 38–126)
Anion gap: 14 (ref 5–15)
BUN: 9 mg/dL (ref 6–20)
CO2: 27 mmol/L (ref 22–32)
Calcium: 9.9 mg/dL (ref 8.9–10.3)
Chloride: 96 mmol/L — ABNORMAL LOW (ref 98–111)
Creatinine: 1.04 mg/dL — ABNORMAL HIGH (ref 0.44–1.00)
GFR, Estimated: >60 mL/min (ref >60)
Glucose, Bld: 577 mg/dL (ref 70–99)
Potassium: 3.1 mmol/L — ABNORMAL LOW (ref 3.5–5.1)
Sodium: 136 mmol/L (ref 135–145)
Total Bilirubin: 0.3 mg/dL (ref 0.0–1.2)
Total Protein: 7.6 g/dL (ref 6.5–8.1)

## 2023-12-08 MED ORDER — POTASSIUM CHLORIDE CRYS ER 20 MEQ PO TBCR: 20.0000 meq | EXTENDED_RELEASE_TABLET | Freq: Two times a day (BID) | ORAL | 1 refills | Status: AC

## 2023-12-08 MED ORDER — INFLUENZA VIRUS VACC SPLIT PF (FLUZONE) 0.5 ML IM SUSY
0.5000 mL | PREFILLED_SYRINGE | Freq: Once | INTRAMUSCULAR | Status: AC
  Administered 2023-12-08: 0.5 mL via INTRAMUSCULAR
  Filled 2023-12-08: qty 0.5

## 2023-12-08 NOTE — Telephone Encounter (Signed)
 CRITICAL VALUE STICKER  CRITICAL VALUE:  Critical glucose  577  RECEIVER (on-site recipient of call): Greig Martell PEAK  DATE & TIME NOTIFIED: 12/08/2023 @ 1611  MESSENGER (representative from lab): Luke Shine  MD NOTIFIED: Dr Cornelius  TIME OF NOTIFICATION:1612  RESPONSE:  Dr Cornelius in room with pt.

## 2023-12-08 NOTE — Telephone Encounter (Signed)
 12/07/23 Sent request for Transportation to [place] [facility]for appt on 12/08/23.
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
## 2023-12-14 ENCOUNTER — Other Ambulatory Visit: Payer: Self-pay

## 2023-12-14 DIAGNOSIS — D72829 Elevated white blood cell count, unspecified: Secondary | ICD-10-CM | POA: Insufficient documentation

## 2023-12-14 DIAGNOSIS — Z6823 Body mass index (BMI) 23.0-23.9, adult: Secondary | ICD-10-CM | POA: Insufficient documentation

## 2023-12-14 DIAGNOSIS — Z9989 Dependence on other enabling machines and devices: Secondary | ICD-10-CM | POA: Insufficient documentation

## 2023-12-14 DIAGNOSIS — F419 Anxiety disorder, unspecified: Secondary | ICD-10-CM | POA: Insufficient documentation

## 2023-12-14 DIAGNOSIS — E1140 Type 2 diabetes mellitus with diabetic neuropathy, unspecified: Secondary | ICD-10-CM | POA: Insufficient documentation

## 2023-12-14 DIAGNOSIS — C50919 Malignant neoplasm of unspecified site of unspecified female breast: Secondary | ICD-10-CM | POA: Insufficient documentation

## 2023-12-14 DIAGNOSIS — I519 Heart disease, unspecified: Secondary | ICD-10-CM | POA: Insufficient documentation

## 2023-12-14 DIAGNOSIS — G709 Myoneural disorder, unspecified: Secondary | ICD-10-CM | POA: Insufficient documentation

## 2023-12-14 DIAGNOSIS — G894 Chronic pain syndrome: Secondary | ICD-10-CM | POA: Insufficient documentation

## 2023-12-14 DIAGNOSIS — I739 Peripheral vascular disease, unspecified: Secondary | ICD-10-CM | POA: Insufficient documentation

## 2023-12-14 DIAGNOSIS — F321 Major depressive disorder, single episode, moderate: Secondary | ICD-10-CM | POA: Insufficient documentation

## 2023-12-14 DIAGNOSIS — E119 Type 2 diabetes mellitus without complications: Secondary | ICD-10-CM | POA: Insufficient documentation

## 2023-12-14 DIAGNOSIS — Z95828 Presence of other vascular implants and grafts: Secondary | ICD-10-CM | POA: Insufficient documentation

## 2023-12-14 DIAGNOSIS — I10 Essential (primary) hypertension: Secondary | ICD-10-CM | POA: Insufficient documentation

## 2023-12-18 ENCOUNTER — Ambulatory Visit

## 2023-12-22 ENCOUNTER — Ambulatory Visit (HOSPITAL_BASED_OUTPATIENT_CLINIC_OR_DEPARTMENT_OTHER)
Admission: RE | Admit: 2023-12-22 | Discharge: 2023-12-22 | Disposition: A | Discharge status: Home / Self Care | Source: Ambulatory Visit | Attending: Oncology | Admitting: Oncology

## 2023-12-22 ENCOUNTER — Encounter (HOSPITAL_BASED_OUTPATIENT_CLINIC_OR_DEPARTMENT_OTHER): Payer: Self-pay | Admitting: Radiology

## 2023-12-22 ENCOUNTER — Encounter: Payer: Self-pay | Admitting: Hematology and Oncology

## 2023-12-22 DIAGNOSIS — Z171 Estrogen receptor negative status [ER-]: Secondary | ICD-10-CM

## 2023-12-22 DIAGNOSIS — Z1231 Encounter for screening mammogram for malignant neoplasm of breast: Secondary | ICD-10-CM | POA: Diagnosis not present

## 2023-12-22 DIAGNOSIS — C50512 Malignant neoplasm of lower-outer quadrant of left female breast: Secondary | ICD-10-CM

## 2023-12-27 ENCOUNTER — Other Ambulatory Visit: Payer: Self-pay

## 2023-12-27 DIAGNOSIS — G620 Drug-induced polyneuropathy: Secondary | ICD-10-CM

## 2023-12-27 MED ORDER — OXYCODONE HCL 10 MG PO TABS: 10.0000 mg | ORAL_TABLET | ORAL | 0 refills | Status: DC | PRN

## 2024-01-22 ENCOUNTER — Other Ambulatory Visit: Payer: Self-pay

## 2024-01-22 DIAGNOSIS — T451X5A Adverse effect of antineoplastic and immunosuppressive drugs, initial encounter: Secondary | ICD-10-CM

## 2024-01-22 MED ORDER — OXYCODONE HCL 10 MG PO TABS: 10.0000 mg | ORAL_TABLET | ORAL | 0 refills | Status: DC | PRN

## 2024-02-16 ENCOUNTER — Other Ambulatory Visit: Payer: Self-pay

## 2024-02-16 DIAGNOSIS — G620 Drug-induced polyneuropathy: Secondary | ICD-10-CM

## 2024-02-16 MED ORDER — OXYCODONE HCL 10 MG PO TABS: 10.0000 mg | ORAL_TABLET | ORAL | 0 refills | Status: AC | PRN

## 2024-02-16 NOTE — Telephone Encounter (Signed)
Patient aware refill has been sent to pharmacy 

## 2024-02-23 NOTE — Progress Notes (Signed)
 Brittney Burgess                                          MRN: [id number]   02/23/2024   The [facility] Quality Team Specialist reviewed this patient medical record for the purposes of chart review for care gap closure. The following were reviewed: chart review for care gap closure-glycemic status assessment.    [facility] Quality Team

## 2024-03-01 ENCOUNTER — Encounter: Payer: Self-pay | Admitting: *Deleted

## 2024-03-01 NOTE — Progress Notes (Signed)
 KAEDEN DEPAZ                                          MRN: [id number]   03/01/2024   The [facility] Quality Team Specialist reviewed this patient medical record for the purposes of chart review for care gap closure. The following were reviewed: chart review for care gap closure-glycemic status assessment.    [facility] Quality Team

## 2024-06-06 ENCOUNTER — Inpatient Hospital Stay

## 2024-06-06 ENCOUNTER — Inpatient Hospital Stay: Admitting: Oncology
